# Patient Record
Sex: Male | Born: 1945 | Race: Black or African American | Hispanic: No | Marital: Married | Smoking: Former smoker
Health system: Southern US, Community
[De-identification: ages and names within clinical notes are randomized; demographics above are authoritative.]

## PROBLEM LIST (undated history)

## (undated) DIAGNOSIS — M51369 Other intervertebral disc degeneration, lumbar region without mention of lumbar back pain or lower extremity pain: Secondary | ICD-10-CM

## (undated) DIAGNOSIS — I723 Aneurysm of iliac artery: Secondary | ICD-10-CM

## (undated) DIAGNOSIS — M5136 Other intervertebral disc degeneration, lumbar region: Secondary | ICD-10-CM

## (undated) DIAGNOSIS — K279 Peptic ulcer, site unspecified, unspecified as acute or chronic, without hemorrhage or perforation: Secondary | ICD-10-CM

## (undated) DIAGNOSIS — E079 Disorder of thyroid, unspecified: Secondary | ICD-10-CM

## (undated) DIAGNOSIS — K219 Gastro-esophageal reflux disease without esophagitis: Secondary | ICD-10-CM

## (undated) DIAGNOSIS — E039 Hypothyroidism, unspecified: Secondary | ICD-10-CM

## (undated) DIAGNOSIS — N419 Inflammatory disease of prostate, unspecified: Secondary | ICD-10-CM

## (undated) DIAGNOSIS — I739 Peripheral vascular disease, unspecified: Secondary | ICD-10-CM

## (undated) DIAGNOSIS — E0590 Thyrotoxicosis, unspecified without thyrotoxic crisis or storm: Secondary | ICD-10-CM

## (undated) DIAGNOSIS — K5730 Diverticulosis of large intestine without perforation or abscess without bleeding: Secondary | ICD-10-CM

## (undated) HISTORY — PX: THYROIDECTOMY: SHX17

## (undated) HISTORY — DX: Inflammatory disease of prostate, unspecified: N41.9

## (undated) HISTORY — PX: TONSILLECTOMY: SUR1361

## (undated) HISTORY — PX: NOSE SURGERY: SHX723

## (undated) HISTORY — DX: Peripheral vascular disease, unspecified: I73.9

## (undated) HISTORY — DX: Aneurysm of iliac artery: I72.3

---

## 2003-01-11 ENCOUNTER — Emergency Department (HOSPITAL_COMMUNITY): Admission: EM | Admit: 2003-01-11 | Discharge: 2003-01-11 | Payer: Self-pay | Admitting: Emergency Medicine

## 2003-01-11 ENCOUNTER — Encounter: Payer: Self-pay | Admitting: Internal Medicine

## 2003-06-21 ENCOUNTER — Ambulatory Visit (HOSPITAL_COMMUNITY): Admission: RE | Admit: 2003-06-21 | Discharge: 2003-06-21 | Payer: Self-pay | Admitting: Internal Medicine

## 2003-06-24 HISTORY — PX: COLONOSCOPY: SHX174

## 2003-07-07 ENCOUNTER — Ambulatory Visit (HOSPITAL_COMMUNITY): Admission: RE | Admit: 2003-07-07 | Discharge: 2003-07-07 | Payer: Self-pay | Admitting: General Surgery

## 2003-09-23 HISTORY — PX: ESOPHAGOGASTRODUODENOSCOPY: SHX1529

## 2003-10-12 ENCOUNTER — Inpatient Hospital Stay (HOSPITAL_COMMUNITY): Admission: AD | Admit: 2003-10-12 | Discharge: 2003-10-14 | Payer: Self-pay | Admitting: Family Medicine

## 2004-12-12 ENCOUNTER — Ambulatory Visit: Payer: Self-pay | Admitting: Family Medicine

## 2005-01-23 ENCOUNTER — Ambulatory Visit: Payer: Self-pay | Admitting: Family Medicine

## 2005-01-23 ENCOUNTER — Emergency Department (HOSPITAL_COMMUNITY): Admission: EM | Admit: 2005-01-23 | Discharge: 2005-01-23 | Payer: Self-pay | Admitting: Emergency Medicine

## 2010-04-14 ENCOUNTER — Encounter: Payer: Self-pay | Admitting: Internal Medicine

## 2010-04-15 ENCOUNTER — Encounter: Payer: Self-pay | Admitting: Internal Medicine

## 2012-03-17 ENCOUNTER — Encounter (HOSPITAL_COMMUNITY): Payer: Self-pay | Admitting: *Deleted

## 2012-03-17 ENCOUNTER — Emergency Department (HOSPITAL_COMMUNITY): Payer: Self-pay

## 2012-03-17 ENCOUNTER — Emergency Department (HOSPITAL_COMMUNITY)
Admission: EM | Admit: 2012-03-17 | Discharge: 2012-03-17 | Disposition: A | Discharge status: Home / Self Care | Payer: Self-pay | Attending: Emergency Medicine | Admitting: Emergency Medicine

## 2012-03-17 DIAGNOSIS — R197 Diarrhea, unspecified: Secondary | ICD-10-CM | POA: Insufficient documentation

## 2012-03-17 DIAGNOSIS — Z8719 Personal history of other diseases of the digestive system: Secondary | ICD-10-CM | POA: Insufficient documentation

## 2012-03-17 DIAGNOSIS — Z8639 Personal history of other endocrine, nutritional and metabolic disease: Secondary | ICD-10-CM | POA: Insufficient documentation

## 2012-03-17 DIAGNOSIS — Z862 Personal history of diseases of the blood and blood-forming organs and certain disorders involving the immune mechanism: Secondary | ICD-10-CM | POA: Insufficient documentation

## 2012-03-17 DIAGNOSIS — Z8711 Personal history of peptic ulcer disease: Secondary | ICD-10-CM | POA: Insufficient documentation

## 2012-03-17 DIAGNOSIS — R109 Unspecified abdominal pain: Secondary | ICD-10-CM | POA: Insufficient documentation

## 2012-03-17 DIAGNOSIS — R110 Nausea: Secondary | ICD-10-CM | POA: Insufficient documentation

## 2012-03-17 HISTORY — DX: Disorder of thyroid, unspecified: E07.9

## 2012-03-17 HISTORY — DX: Diverticulosis of large intestine without perforation or abscess without bleeding: K57.30

## 2012-03-17 HISTORY — DX: Peptic ulcer, site unspecified, unspecified as acute or chronic, without hemorrhage or perforation: K27.9

## 2012-03-17 HISTORY — DX: Other intervertebral disc degeneration, lumbar region: M51.36

## 2012-03-17 HISTORY — DX: Other intervertebral disc degeneration, lumbar region without mention of lumbar back pain or lower extremity pain: M51.369

## 2012-03-17 LAB — URINALYSIS, ROUTINE W REFLEX MICROSCOPIC
Bilirubin Urine: NEGATIVE
Glucose, UA: NEGATIVE mg/dL
Ketones, ur: NEGATIVE mg/dL
Nitrite: NEGATIVE
Specific Gravity, Urine: 1.01 (ref 1.005–1.030)
pH: 6 (ref 5.0–8.0)

## 2012-03-17 LAB — CBC WITH DIFFERENTIAL/PLATELET
Basophils Absolute: 0 10*3/uL (ref 0.0–0.1)
Basophils Relative: 0 % (ref 0–1)
Eosinophils Absolute: 0 10*3/uL (ref 0.0–0.7)
Hemoglobin: 15.9 g/dL (ref 13.0–17.0)
MCH: 30.3 pg (ref 26.0–34.0)
MCHC: 34.1 g/dL (ref 30.0–36.0)
Monocytes Relative: 18 % — ABNORMAL HIGH (ref 3–12)
Neutro Abs: 3.2 10*3/uL (ref 1.7–7.7)
Neutrophils Relative %: 56 % (ref 43–77)
Platelets: 229 10*3/uL (ref 150–400)
RDW: 14.9 % (ref 11.5–15.5)

## 2012-03-17 LAB — COMPREHENSIVE METABOLIC PANEL
ALT: 15 U/L (ref 0–53)
AST: 20 U/L (ref 0–37)
Albumin: 4.1 g/dL (ref 3.5–5.2)
Alkaline Phosphatase: 68 U/L (ref 39–117)
BUN: 15 mg/dL (ref 6–23)
Chloride: 97 mEq/L (ref 96–112)
Potassium: 3.9 mEq/L (ref 3.5–5.1)
Sodium: 133 mEq/L — ABNORMAL LOW (ref 135–145)
Total Bilirubin: 0.5 mg/dL (ref 0.3–1.2)
Total Protein: 8.1 g/dL (ref 6.0–8.3)

## 2012-03-17 LAB — LIPASE, BLOOD: Lipase: 44 U/L (ref 11–59)

## 2012-03-17 MED ORDER — IOHEXOL 300 MG/ML  SOLN
100.0000 mL | Freq: Once | INTRAMUSCULAR | Status: AC | PRN
  Administered 2012-03-17: 100 mL via INTRAVENOUS

## 2012-03-17 MED ORDER — FAMOTIDINE IN NACL 20-0.9 MG/50ML-% IV SOLN
20.0000 mg | Freq: Once | INTRAVENOUS | Status: AC
  Administered 2012-03-17: 20 mg via INTRAVENOUS
  Filled 2012-03-17: qty 50

## 2012-03-17 MED ORDER — ONDANSETRON HCL 4 MG PO TABS: 4.0000 mg | ORAL_TABLET | Freq: Three times a day (TID) | ORAL | Status: DC | PRN

## 2012-03-17 MED ORDER — FENTANYL CITRATE 0.05 MG/ML IJ SOLN
50.0000 ug | INTRAMUSCULAR | Status: DC | PRN
  Administered 2012-03-17: 50 ug via INTRAVENOUS
  Filled 2012-03-17: qty 2

## 2012-03-17 MED ORDER — SODIUM CHLORIDE 0.9 % IV SOLN
INTRAVENOUS | Status: DC
  Administered 2012-03-17: 18:00:00 via INTRAVENOUS

## 2012-03-17 MED ORDER — ONDANSETRON HCL 4 MG/2ML IJ SOLN
4.0000 mg | INTRAMUSCULAR | Status: DC | PRN
  Administered 2012-03-17: 4 mg via INTRAVENOUS
  Filled 2012-03-17: qty 2

## 2012-03-17 MED ORDER — PANTOPRAZOLE SODIUM 40 MG IV SOLR
40.0000 mg | Freq: Once | INTRAVENOUS | Status: AC
  Administered 2012-03-17: 40 mg via INTRAVENOUS
  Filled 2012-03-17: qty 40

## 2012-03-17 NOTE — ED Notes (Signed)
Family at bedside. Patient and family state they do not need anything at this time.

## 2012-03-17 NOTE — ED Notes (Signed)
Pt reporting improvement in pain level.  Denies nausea at this time.  Per MD, occult blood test was negative.  Pt and family denies needs at present time.  No distress noted.

## 2012-03-17 NOTE — ED Notes (Addendum)
abd pain, nausea, no vomiting, cough, chills,  Diarrhea,  Denies urinary sx.

## 2012-03-17 NOTE — ED Notes (Signed)
Pt c/o left side abd pain that started on Sunday, admits to nausea and chills, denies any vomiting, diarrhea, pt does state that he has had productive cough with green sputum over the past few days,

## 2012-03-17 NOTE — ED Provider Notes (Signed)
History     CSN: [id number]  Arrival date & time 03/17/12  1652   First MD Initiated Contact with Patient 03/17/12 1704      Chief Complaint  Patient presents with  . Abdominal Pain     HPI Pt was seen at 1705.   Per pt, c/o gradual onset and persistence of constant abd "pain" for the past 2 days.  States the pain began in the left side of his abd and now is "all over." Has been associated with nausea and multiple intermittent episodes of diarrhea.  Describes the diarrhea as "watery." Denies vomiting, no fevers, no back pain, no rash, no CP/SOB, no black or blood in stools, no testicular pain/swelling, no dysuria/hematuria.       Past Medical History  Diagnosis Date  . Peptic ulcer   . Thyroid disease   . Diverticula of colon     Past Surgical History  Procedure Date  . Thyroidectomy   . Tonsillectomy     History  Substance Use Topics  . Smoking status: Never Smoker   . Smokeless tobacco: Not on file  . Alcohol Use: No     Comment: former    Review of Systems ROS: Statement: All systems negative except as marked or noted in the HPI; Constitutional: Negative for fever and chills. ; ; Eyes: Negative for eye pain, redness and discharge. ; ; ENMT: Negative for ear pain, hoarseness, nasal congestion, sinus pressure and sore throat. ; ; Cardiovascular: Negative for chest pain, palpitations, diaphoresis, dyspnea and peripheral edema. ; ; Respiratory: Negative for cough, wheezing and stridor. ; ; Gastrointestinal: +nausea, diarrhea, abd pain. Negative for vomiting, blood in stool, hematemesis, jaundice and rectal bleeding. . ; ; Genitourinary: Negative for dysuria, flank pain and hematuria. ; ; Genital:  No penile drainage or rash, no testicular pain or swelling, no scrotal rash or swelling.;; Musculoskeletal: Negative for back pain and neck pain. Negative for swelling and trauma.; ; Skin: Negative for pruritus, rash, abrasions, blisters, bruising and skin lesion.; ; Neuro: Negative  for headache, lightheadedness and neck stiffness. Negative for weakness, altered level of consciousness , altered mental status, extremity weakness, paresthesias, involuntary movement, seizure and syncope.      Allergies  Review of patient's allergies indicates no known allergies.  Home Medications  No current outpatient prescriptions on file.  BP 121/82  Pulse 99  Temp 98 F (36.7 C) (Oral)  Resp 18  Ht 6\' 2"  (1.88 m)  Wt 172 lb (78.019 kg)  BMI 22.08 kg/m2  SpO2 97%  Physical Exam 1710: Physical examination:  Nursing notes reviewed; Vital signs and O2 SAT reviewed;  Constitutional: Well developed, Well nourished, Well hydrated, Uncomfortable appearing; Head:  Normocephalic, atraumatic; Eyes: EOMI, PERRL, No scleral icterus; ENMT: Mouth and pharynx normal, Mucous membranes moist; Neck: Supple, Full range of motion, No lymphadenopathy; Cardiovascular: Regular rate and rhythm, No murmur, rub, or gallop; Respiratory: Breath sounds clear & equal bilaterally, No rales, rhonchi, wheezes.  Speaking full sentences with ease, Normal respiratory effort/excursion; Chest: Nontender, Movement normal; Abdomen: Soft, +LUQ>LLQ tenderness to palp. No rebound or guarding. Nondistended, Normal bowel sounds. Rectal exam performed w/permission of pt and ED RN chaparone present.  Anal tone normal.  Non-tender, soft brown stool in rectal vault, heme neg.  No fissures, no external hemorrhoids, no palp masses.;; Genitourinary: No CVA tenderness; Extremities: Pulses normal, No tenderness, No edema, No calf edema or asymmetry.; Neuro: AA&Ox3, Major CN grossly intact.  Speech clear. No gross focal motor or  sensory deficits in extremities.; Skin: Color normal, Warm, Dry.   ED Course  Procedures    MDM  MDM Reviewed: nursing note, previous chart and vitals Interpretation: labs, x-ray and CT scan   Results for orders placed during the hospital encounter of 03/17/12  CBC WITH DIFFERENTIAL      Component Value  Range   WBC 5.7  4.0 - 10.5 K/uL   RBC 5.24  4.22 - 5.81 MIL/uL   Hemoglobin 15.9  13.0 - 17.0 g/dL   HCT 47.8  29.5 - 62.1 %   MCV 88.9  78.0 - 100.0 fL   MCH 30.3  26.0 - 34.0 pg   MCHC 34.1  30.0 - 36.0 g/dL   RDW 30.8  65.7 - 84.6 %   Platelets 229  150 - 400 K/uL   Neutrophils Relative 56  43 - 77 %   Neutro Abs 3.2  1.7 - 7.7 K/uL   Lymphocytes Relative 25  12 - 46 %   Lymphs Abs 1.4  0.7 - 4.0 K/uL   Monocytes Relative 18 (*) 3 - 12 %   Monocytes Absolute 1.1 (*) 0.1 - 1.0 K/uL   Eosinophils Relative 0  0 - 5 %   Eosinophils Absolute 0.0  0.0 - 0.7 K/uL   Basophils Relative 0  0 - 1 %   Basophils Absolute 0.0  0.0 - 0.1 K/uL  COMPREHENSIVE METABOLIC PANEL      Component Value Range   Sodium 133 (*) 135 - 145 mEq/L   Potassium 3.9  3.5 - 5.1 mEq/L   Chloride 97  96 - 112 mEq/L   CO2 26  19 - 32 mEq/L   Glucose, Bld 100 (*) 70 - 99 mg/dL   BUN 15  6 - 23 mg/dL   Creatinine, Ser 9.62  0.50 - 1.35 mg/dL   Calcium 95.2  8.4 - 84.1 mg/dL   Total Protein 8.1  6.0 - 8.3 g/dL   Albumin 4.1  3.5 - 5.2 g/dL   AST 20  0 - 37 U/L   ALT 15  0 - 53 U/L   Alkaline Phosphatase 68  39 - 117 U/L   Total Bilirubin 0.5  0.3 - 1.2 mg/dL   GFR calc non Af Amer 69 (*) >90 mL/min   GFR calc Af Amer 80 (*) >90 mL/min  LIPASE, BLOOD      Component Value Range   Lipase 44  11 - 59 U/L  URINALYSIS, ROUTINE W REFLEX MICROSCOPIC      Component Value Range   Color, Urine YELLOW  YELLOW   APPearance CLEAR  CLEAR   Specific Gravity, Urine 1.010  1.005 - 1.030   pH 6.0  5.0 - 8.0   Glucose, UA NEGATIVE  NEGATIVE mg/dL   Hgb urine dipstick NEGATIVE  NEGATIVE   Bilirubin Urine NEGATIVE  NEGATIVE   Ketones, ur NEGATIVE  NEGATIVE mg/dL   Protein, ur TRACE (*) NEGATIVE mg/dL   Urobilinogen, UA 0.2  0.0 - 1.0 mg/dL   Nitrite NEGATIVE  NEGATIVE   Leukocytes, UA NEGATIVE  NEGATIVE  LACTIC ACID, PLASMA      Component Value Range   Lactic Acid, Venous 1.5  0.5 - 2.2 mmol/L  URINE MICROSCOPIC-ADD  ON      Component Value Range   Squamous Epithelial / LPF RARE  RARE   Dg Chest 2 View 03/17/2012  *RADIOLOGY REPORT*  Clinical Data: Abdominal pain.  Nausea.  Infiltrate.  CHEST - 2 VIEW  Comparison: 10/12/2003.  Findings:    Cardiopericardial silhouette within normal limits. Mediastinal contours normal. Trachea midline.  No airspace disease or effusion.  On the lateral view, there is a 14 mm nodular density projected over the upper thoracic spine.  This may represent pulmonary nodule or small bony lesion.  This was not visualized on the comparison radiograph.  IMPRESSION: No active cardiopulmonary disease.  Nodular density projected over the upper thoracic spine on the lateral view.  This is not seen on the frontal view.  Pulmonary nodule is not excluded.  Follow-up noncontrast chest CT is recommended which can be performed on a non emergent basis.   Original Report Authenticated By: Andreas Newport, M.D.    Ct Abdomen Pelvis W Contrast 03/17/2012  *RADIOLOGY REPORT*  Clinical Data: Abdominal pain, diarrhea  CT ABDOMEN AND PELVIS WITH CONTRAST  Technique:  Multidetector CT imaging of the abdomen and pelvis was performed following the standard protocol during bolus administration of intravenous contrast.  Contrast: OMNIPAQUE IOHEXOL 300 MG/ML  SOLN  Comparison: CT abdomen of 01/23/2005  Findings:  The lung bases are clear.  The liver enhances with only a single small sub centimeter low attenuation structure in the right lobe which appears stable and most consistent with a benign process such as a cyst.  No ductal dilatation is seen.  No calcified gallstones are noted.  The pancreas is normal in size and the pancreatic duct is not dilated.  The adrenal glands and spleen are unremarkable although there may be a small left adrenal adenoma present which appears stable.  The stomach is not well distended. The kidneys enhance with no calculus or mass and on delayed images, the pelvocaliceal systems are  unremarkable.  The abdominal aorta is normal in caliber.  No adenopathy is seen.  Atheromatous change is noted in the distal abdominal aorta extending into the common iliac arteries.  The urinary bladder is unremarkable.  The prostate is slightly prominent.  There is some fluid noted within the colon which may be due to the recent diarrhea.  No colonic mass or edema is seen.  The terminal ileum and the appendix are unremarkable.  Degenerative disc disease is noted diffusely throughout the lumbar spine.  IMPRESSION: 1.  There is some fluid in the colon most likely due to diarrhea. However, no colonic mass or edema is seen.  2.  No explanation for the patient's pain is seen.  3.  Diffuse degenerative change throughout the lumbar spine.   Original Report Authenticated By: Dwyane Dee, M.D.       1950:   Pt has tol PO well while in the ED without N/V.  No stooling while in the ED.  VSS. Pt states he feels better and wants to go home now. Wife now at bedside, states he "hasn't been taking his stomach medicines" for several days before his symptoms began. Pt encouraged to take his usual meds daily as prescribed. Dx and testing d/w pt and family.  Questions answered.  Verb understanding, agreeable to d/c home with outpt f/u.          Laray Anger, DO 03/20/12 0025

## 2012-03-17 NOTE — ED Notes (Signed)
Performed pericare and put depends on. Patient asked does he live at home and patient states he does.

## 2012-03-19 LAB — URINE CULTURE: Colony Count: 15000

## 2012-03-19 LAB — OCCULT BLOOD, POC DEVICE: Fecal Occult Bld: NEGATIVE

## 2012-10-16 ENCOUNTER — Emergency Department (HOSPITAL_COMMUNITY): Payer: Medicare Other

## 2012-10-16 ENCOUNTER — Encounter (HOSPITAL_COMMUNITY): Payer: Self-pay

## 2012-10-16 ENCOUNTER — Emergency Department (HOSPITAL_COMMUNITY)
Admission: EM | Admit: 2012-10-16 | Discharge: 2012-10-16 | Disposition: A | Discharge status: Home / Self Care | Payer: Medicare Other | Attending: Emergency Medicine | Admitting: Emergency Medicine

## 2012-10-16 DIAGNOSIS — R109 Unspecified abdominal pain: Secondary | ICD-10-CM | POA: Insufficient documentation

## 2012-10-16 DIAGNOSIS — Z8719 Personal history of other diseases of the digestive system: Secondary | ICD-10-CM | POA: Insufficient documentation

## 2012-10-16 DIAGNOSIS — N390 Urinary tract infection, site not specified: Secondary | ICD-10-CM | POA: Insufficient documentation

## 2012-10-16 DIAGNOSIS — Z8739 Personal history of other diseases of the musculoskeletal system and connective tissue: Secondary | ICD-10-CM | POA: Insufficient documentation

## 2012-10-16 DIAGNOSIS — E079 Disorder of thyroid, unspecified: Secondary | ICD-10-CM | POA: Insufficient documentation

## 2012-10-16 DIAGNOSIS — R6883 Chills (without fever): Secondary | ICD-10-CM | POA: Insufficient documentation

## 2012-10-16 DIAGNOSIS — Z79899 Other long term (current) drug therapy: Secondary | ICD-10-CM | POA: Insufficient documentation

## 2012-10-16 DIAGNOSIS — R32 Unspecified urinary incontinence: Secondary | ICD-10-CM | POA: Insufficient documentation

## 2012-10-16 LAB — CBC WITH DIFFERENTIAL/PLATELET
Eosinophils Relative: 0 % (ref 0–5)
HCT: 43.8 % (ref 39.0–52.0)
Hemoglobin: 15.1 g/dL (ref 13.0–17.0)
Lymphocytes Relative: 11 % — ABNORMAL LOW (ref 12–46)
MCHC: 34.5 g/dL (ref 30.0–36.0)
MCV: 89.9 fL (ref 78.0–100.0)
Monocytes Absolute: 1.8 10*3/uL — ABNORMAL HIGH (ref 0.1–1.0)
Monocytes Relative: 10 % (ref 3–12)
Neutro Abs: 14.7 10*3/uL — ABNORMAL HIGH (ref 1.7–7.7)
WBC: 18.6 10*3/uL — ABNORMAL HIGH (ref 4.0–10.5)

## 2012-10-16 LAB — COMPREHENSIVE METABOLIC PANEL
Alkaline Phosphatase: 78 U/L (ref 39–117)
BUN: 14 mg/dL (ref 6–23)
CO2: 29 mEq/L (ref 19–32)
Chloride: 98 mEq/L (ref 96–112)
Creatinine, Ser: 0.83 mg/dL (ref 0.50–1.35)
Glucose, Bld: 125 mg/dL — ABNORMAL HIGH (ref 70–99)

## 2012-10-16 LAB — URINALYSIS, ROUTINE W REFLEX MICROSCOPIC
Specific Gravity, Urine: >1.03 — ABNORMAL HIGH (ref 1.005–1.030)
pH: 5.5 (ref 5.0–8.0)

## 2012-10-16 LAB — URINE MICROSCOPIC-ADD ON

## 2012-10-16 MED ORDER — IOHEXOL 300 MG/ML  SOLN
50.0000 mL | Freq: Once | INTRAMUSCULAR | Status: AC | PRN
  Administered 2012-10-16: 50 mL via ORAL

## 2012-10-16 MED ORDER — HYDROCODONE-ACETAMINOPHEN 5-325 MG PO TABS: 1.0000 | ORAL_TABLET | Freq: Four times a day (QID) | ORAL | Status: DC | PRN

## 2012-10-16 MED ORDER — HYDROMORPHONE HCL PF 1 MG/ML IJ SOLN
1.0000 mg | Freq: Once | INTRAMUSCULAR | Status: AC
  Administered 2012-10-16: 1 mg via INTRAVENOUS
  Filled 2012-10-16: qty 1

## 2012-10-16 MED ORDER — IOHEXOL 300 MG/ML  SOLN
100.0000 mL | Freq: Once | INTRAMUSCULAR | Status: AC | PRN
  Administered 2012-10-16: 100 mL via INTRAVENOUS

## 2012-10-16 MED ORDER — ONDANSETRON HCL 4 MG/2ML IJ SOLN
4.0000 mg | Freq: Once | INTRAMUSCULAR | Status: AC
  Administered 2012-10-16: 4 mg via INTRAVENOUS
  Filled 2012-10-16: qty 2

## 2012-10-16 MED ORDER — CEPHALEXIN 500 MG PO CAPS: 500.0000 mg | ORAL_CAPSULE | Freq: Four times a day (QID) | ORAL | Status: DC

## 2012-10-16 MED ORDER — CIPROFLOXACIN HCL 500 MG PO TABS: 500.0000 mg | ORAL_TABLET | Freq: Two times a day (BID) | ORAL | Status: DC

## 2012-10-16 MED ORDER — DEXTROSE 5 % IV SOLN: 1.0000 g | Freq: Once | INTRAVENOUS | Status: DC

## 2012-10-16 MED ORDER — DEXTROSE 5 % IV SOLN
1.0000 g | Freq: Once | INTRAVENOUS | Status: AC
  Administered 2012-10-16: 1 g via INTRAVENOUS
  Filled 2012-10-16: qty 10

## 2012-10-16 NOTE — ED Notes (Signed)
Pt states he is unable to hold his urine since yesterday. States he has had this problem before. Also, complain of back and belly pain

## 2012-10-16 NOTE — Discharge Instructions (Signed)
Drink plenty of fluids.  Follow up with your md next week °

## 2012-10-16 NOTE — ED Provider Notes (Signed)
CSN: [id number]     Arrival date & time 10/16/12  7829 History  This chart was scribed for Benny Lennert, MD by Bennett Scrape, ED Scribe. This patient was seen in room APA16A/[facility]16A and the patient's care was started at 9:47 AM.   First MD Initiated Contact with Patient 10/16/12 [phone]     Chief Complaint  Patient presents with  . Urinary Frequency    Patient is a 67 y.o. male presenting with general illness. The history is provided by the patient. No language interpreter was used.  Illness Location:  Urinary incontience  Quality:  Unable to hold urine Duration:  2 days Timing:  Constant Progression:  Unchanged Chronicity:  New Relieved by:  None Worsened by:  None Associated symptoms: abdominal pain   Associated symptoms: no congestion, no cough, no diarrhea, no fatigue, no nausea and no rash     HPI Comments: Sean Leonard is a 67 y.o. male who presents to the Emergency Department complaining of 2 days of urinary incontinence described as a complete inability to hold his urine with associated chills and abdominal pain. The abdominal pain is located in the LLQ and radiates to his lower back. He describes the pain as a soreness. He admits that he has experienced one prior episode of the same symptoms but does not elaborate further. He reports that he called his PCP and had an appointment today for the symptoms but could not wait any longer. He denies fevers and nausea as associated symptoms.    Past Medical History  Diagnosis Date  . Peptic ulcer   . Thyroid disease   . Diverticula of colon   . DDD (degenerative disc disease), lumbar    Past Surgical History  Procedure Laterality Date  . Thyroidectomy    . Tonsillectomy     No family history on file. History  Substance Use Topics  . Smoking status: Never Smoker   . Smokeless tobacco: Not on file  . Alcohol Use: No     Comment: former    Review of Systems  Constitutional: Positive for chills. Negative for  appetite change and fatigue.  HENT: Negative for congestion, sinus pressure and ear discharge.   Eyes: Negative for discharge.  Respiratory: Negative for cough.   Gastrointestinal: Positive for abdominal pain. Negative for nausea and diarrhea.  Genitourinary: Negative for hematuria.       Positive for urinary incontinence   Musculoskeletal: Negative for back pain.  Skin: Negative for rash.  Neurological: Negative for seizures.  Psychiatric/Behavioral: Negative for hallucinations.    Allergies  Review of patient's allergies indicates no known allergies.  Home Medications   Current Outpatient Rx  Name  Route  Sig  Dispense  Refill  . levothyroxine (SYNTHROID, LEVOTHROID) 112 MCG tablet   Oral   Take 112 mcg by mouth every morning.         Marland Kitchen omeprazole (PRILOSEC) 20 MG capsule   Oral   Take 20 mg by mouth daily.         . ondansetron (ZOFRAN) 4 MG tablet   Oral   Take 1 tablet (4 mg total) by mouth every 8 (eight) hours as needed for nausea.   6 tablet   0   . pravastatin (PRAVACHOL) 20 MG tablet   Oral   Take 20 mg by mouth at bedtime.         . sucralfate (CARAFATE) 1 G tablet   Oral   Take 1 g by mouth 2 (two)  times daily.          Triage Vitals: BP 131/78  Pulse 114  Temp(Src) 98.5 F (36.9 C) (Oral)  Resp 26  Ht 6\' 2"  (1.88 m)  Wt 176 lb (79.833 kg)  BMI 22.59 kg/m2  SpO2 100%  Physical Exam  Nursing note and vitals reviewed. Constitutional: He is oriented to person, place, and time. He appears well-developed.  HENT:  Head: Normocephalic.  Eyes: Conjunctivae and EOM are normal. No scleral icterus.  Neck: Neck supple. No thyromegaly present.  Cardiovascular: Normal rate and regular rhythm.  Exam reveals no gallop and no friction rub.   No murmur heard. Pulmonary/Chest: No stridor. He has no wheezes. He has no rales. He exhibits no tenderness.  Abdominal: He exhibits no distension. There is tenderness (LUQ, LLQ and epigastric region tenderness).  There is no rebound.  Musculoskeletal: Normal range of motion. He exhibits no edema.  Lymphadenopathy:    He has no cervical adenopathy.  Neurological: He is oriented to person, place, and time. Coordination normal.  Skin: No rash noted. No erythema.  Psychiatric: He has a normal mood and affect. His behavior is normal.    ED Course   Procedures (including critical care time)  Medications  cefTRIAXone (ROCEPHIN) 1 g in dextrose 5 % 50 mL IVPB (not administered)  HYDROmorphone (DILAUDID) injection 1 mg (1 mg Intravenous Given 10/16/12 1009)  ondansetron (ZOFRAN) injection 4 mg (4 mg Intravenous Given 10/16/12 1009)  iohexol (OMNIPAQUE) 300 MG/ML solution 50 mL (50 mLs Oral Contrast Given 10/16/12 1014)  iohexol (OMNIPAQUE) 300 MG/ML solution 100 mL (100 mLs Intravenous Contrast Given 10/16/12 1121)    DIAGNOSTIC STUDIES: Oxygen Saturation is 199% on room air, normal by my interpretation.    COORDINATION OF CARE: 9:50 AM-Discussed treatment plan which includes medications, CT of abdomen, CBC panel, CMP, and UA with pt at bedside and pt agreed to plan.  12:26 PM-Pt rechecked and is resting comfortably. Informed pt of radiology and lab work results showing a bladder infection. Discussed discharge plan which includes antibiotics with pt and pt agreed to plan. Also advised pt to follow up with PCP next week as needed and pt agreed. Addressed symptoms to return for with pt.   Labs Reviewed  CBC WITH DIFFERENTIAL - Abnormal; Notable for the following:    WBC 18.6 (*)    Neutrophils Relative % 79 (*)    Neutro Abs 14.7 (*)    Lymphocytes Relative 11 (*)    Monocytes Absolute 1.8 (*)    All other components within normal limits  COMPREHENSIVE METABOLIC PANEL - Abnormal; Notable for the following:    Glucose, Bld 125 (*)    All other components within normal limits  URINALYSIS, ROUTINE W REFLEX MICROSCOPIC - Abnormal; Notable for the following:    Color, Urine AMBER (*)    Specific Gravity,  Urine >1.030 (*)    Hgb urine dipstick MODERATE (*)    Bilirubin Urine SMALL (*)    Ketones, ur 15 (*)    Protein, ur 100 (*)    All other components within normal limits  URINE MICROSCOPIC-ADD ON - Abnormal; Notable for the following:    Bacteria, UA MANY (*)    All other components within normal limits  LIPASE, BLOOD   Ct Abdomen Pelvis W Contrast  10/16/2012   *RADIOLOGY REPORT*  Clinical Data: Urinary incontinence 2 days.  Chills and left lower quadrant abdominal pelvic pain.  History of peptic ulcer.  Thyroid disease.  Diverticulitis.  CT ABDOMEN AND PELVIS WITH CONTRAST  Technique:  Multidetector CT imaging of the abdomen and pelvis was performed following the standard protocol during bolus administration of intravenous contrast.  Contrast: 50mL OMNIPAQUE IOHEXOL 300 MG/ML  SOLN, OMNIPAQUE IOHEXOL 300 MG/ML  SOLN  Comparison: 03/17/2012  Findings: Lung bases:  Clear lung bases.  Normal heart size without pericardial or pleural effusion.  Small hiatal hernia.  Abdomen/pelvis:  Tiny right liver lobe lesion is unchanged and likely a cyst.  Splenule.  Underdistended gastric antrum.  Normal pancreas, gallbladder, biliary tract, right adrenal gland.  Mild left adrenal thickening which is unchanged.  Too small to characterize lesions within the kidneys bilaterally. Advanced aortic atherosclerosis. No retroperitoneal or retrocrural adenopathy.  Scattered colonic diverticula.  Appendix is not visualized but there is no evidence of right lower quadrant inflammation.  Normal terminal ileum.  Normal small bowel without abdominal ascites.  Atherosclerosis with stenosis versus occlusion of the right internal iliac artery on image 48/series 2.  It is opacified distally, but mildly aneurysmal 1.4 cm on image 50/series 2. No pelvic adenopathy.  The left internal iliac artery is also aneurysmal and maximally 1.4 cm on image 52/series 2.  Bladder is collapsed around a Foley catheter.  There is moderate  prostatomegaly.  Ill-defined soft tissue plane between the anterior prostate and posterior aspect of the urinary bladder, nonspecific in the setting of Foley catheter. No significant free fluid.  Bones/Musculoskeletal:  No acute osseous abnormality.  Lower lumbar spondylosis and degenerative disc disease.  IMPRESSION:  1. No acute process in the abdomen or pelvis. 2.  Urinary bladder collapsed around a Foley catheter.  Concurrent prostatomegaly and ill-defined periprostatic fat planes. Nonspecific.  Consider correlation with PSA level and physical exam findings to suggest prostatitis. 3.  Bilateral internal iliac artery aneurysms.  Significant stenosis versus near occlusion of the proximal right internal iliac artery.   Original Report Authenticated By: Jeronimo Greaves, M.D.    No diagnosis found.  MDM    The chart was scribed for me under my direct supervision.  I personally performed the history, physical, and medical decision making and all procedures in the evaluation of this patient.Benny Lennert, MD 10/16/12 [phone]

## 2012-11-17 ENCOUNTER — Telehealth: Payer: Self-pay

## 2012-11-17 NOTE — Telephone Encounter (Signed)
Pt is scheduled for OV with Tana Coast PA on 11/19/2012 at 11:30, and he is aware.

## 2012-11-17 NOTE — Telephone Encounter (Signed)
Pt was referred by Dr. Mirna Mires for an urgent appt for weight loss. I called pt and he said he has lost about 25 pounds since last October.  He had gained a few pounds when he saw Dr. Loleta Chance on 10/28/2012 but states he has lost 7 lbs since that office visit.  He had some vomiting on Sat night 11/14/2012 x 3 times but has not had any since then. He does have some abdominal discomfort on his left side at times.  Please advise about urgent appt.

## 2012-11-17 NOTE — Telephone Encounter (Signed)
Within the next 7 days.

## 2012-11-19 ENCOUNTER — Ambulatory Visit (INDEPENDENT_AMBULATORY_CARE_PROVIDER_SITE_OTHER): Payer: Medicare Other | Admitting: Gastroenterology

## 2012-11-19 ENCOUNTER — Encounter: Payer: Self-pay | Admitting: Gastroenterology

## 2012-11-19 VITALS — BP 109/65 | HR 81 | Temp 98.3°F | Ht 74.0 in | Wt 165.8 lb

## 2012-11-19 DIAGNOSIS — R1013 Epigastric pain: Secondary | ICD-10-CM

## 2012-11-19 DIAGNOSIS — R972 Elevated prostate specific antigen [PSA]: Secondary | ICD-10-CM

## 2012-11-19 DIAGNOSIS — Z8711 Personal history of peptic ulcer disease: Secondary | ICD-10-CM

## 2012-11-19 DIAGNOSIS — I723 Aneurysm of iliac artery: Secondary | ICD-10-CM

## 2012-11-19 DIAGNOSIS — I771 Stricture of artery: Secondary | ICD-10-CM

## 2012-11-19 DIAGNOSIS — R634 Abnormal weight loss: Secondary | ICD-10-CM

## 2012-11-19 DIAGNOSIS — R1012 Left upper quadrant pain: Secondary | ICD-10-CM

## 2012-11-19 MED ORDER — OMEPRAZOLE 20 MG PO CPDR: 20.0000 mg | DELAYED_RELEASE_CAPSULE | Freq: Every day | ORAL | Status: DC

## 2012-11-19 NOTE — Assessment & Plan Note (Signed)
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 Patient is symptomatic. Referral to be made.

## 2012-11-19 NOTE — Progress Notes (Signed)
Referral has been sent to [facility] and they will contact me & Mr. Cryder with appointment date & time

## 2012-11-19 NOTE — Patient Instructions (Addendum)
1. I have requested records of prior upper endoscopy and colonoscopy done by Dr. Katrinka Blazing so that we can determine what further workup is necessary. You will need to have an upper endoscopy, but we will need to determine whether or not you should have a colonoscopy as well. Once records are reviewed, we will get you scheduled for procedures as appropriate. 2. 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 The one on the right appears blocked as well. This may be contributing to your right leg pain. We will make a referral to discuss this with a specialist. 3. Keep your appointment with the urologist next week regarding your prostate. 4. You may stop sulcrafate. 5. I recommend you take omeprazole 20mg  daily until we sort out the cause of your abdominal pain. Prescription sent to your pharmacy.

## 2012-11-19 NOTE — Assessment & Plan Note (Signed)
67 year old gentleman with reported 20 pound weight loss for the past one-year he has a history of episodic left upper quadrant/epigastric pain for the past several months. He has had 2 episodes in the past 2 months. Went to the emergency department last month with this. Nothing on CT to explain left upper quadrant pain. Gives history of peptic ulcer disease remotely. Has been on Carafate according to his report for nearly 10 years. He will likely need upper endoscopy. I have requested his prior EGD and colonoscopy reports. He may be due for colonoscopy as well this time. Otherwise denies bowel issues.  Stop Carafate. Begin omeprazole 20 mg daily.

## 2012-11-19 NOTE — Progress Notes (Signed)
Please make arrangements for vascular referral for right internal iliac artery stenosis.

## 2012-11-19 NOTE — Progress Notes (Signed)
Primary Care Physician:  Evlyn Courier, MD  Primary Gastroenterologist:  Roetta Sessions, MD   Chief Complaint  Patient presents with  . Weight Loss    HPI:  Sean Leonard is a 67 y.o. male here at the request of Dr. Loleta Chance for further evaluation of weight loss. Patient states he's lost over 20 pounds since October of last year. Initially he was having trouble getting his Synthroid dose right but has been stable over the past 6 months. At baseline he weighed 185 pounds. Gives a history of peptic ulcer disease back in 2005, diagnosed by Dr. Elpidio Anis. States he's been on Carafate since that time. Had a colonoscopy around that time which he reported to be normal. We have requested records. Patient has episodic pain in the left upper quadrant/epigastric region. He's had 2 episodes in the last 2 months. Intermittent vomiting. No diarrhea, constipation, melena, rectal bleeding, dysphagia, heartburn.  CT scan in July showed bilateral internal iliac artery aneurysms, significant stenosis versus near occlusion of the proximal right internal iliac artery. He also had prostatomegaly and ill-defined. Prostatic fat planes. Patient states he was treated for prostatitis. PSA 16.9. He has an appointment with the urologist at [facility] next week. Chest x-ray in December 2013 showed nodular density projected over the upper thoracic spine on the lateral view. Pulmonary nodule not excluded. Noncontrast chest CT recommended. This has not been done.  Patient also complains of right leg pain with ambulation.  Current Outpatient Prescriptions  Medication Sig Dispense Refill  . fish oil-omega-3 fatty acids 1000 MG capsule Take 2 g by mouth daily.      Marland Kitchen levothyroxine (SYNTHROID, LEVOTHROID) 112 MCG tablet Take 112 mcg by mouth every morning.      . Multiple Vitamin (MULTIVITAMIN) capsule Take 1 capsule by mouth daily.      . pravastatin (PRAVACHOL) 20 MG tablet Take 20 mg by mouth at bedtime.      . sucralfate  (CARAFATE) 1 G tablet Take 1 g by mouth 2 (two) times daily.       No current facility-administered medications for this visit.    Allergies as of 11/19/2012  . (No Known Allergies)    Past Medical History  Diagnosis Date  . Peptic ulcer   . Thyroid disease   . Diverticula of colon   . DDD (degenerative disc disease), lumbar   . Prostatitis   . Iliac artery aneurysm, bilateral     with significant stenosis of right internal iliac artery    Past Surgical History  Procedure Laterality Date  . Thyroidectomy    . Tonsillectomy      Family History  Problem Relation Age of Onset  . Colon cancer Neg Hx   . Liver disease Neg Hx     History   Social History  . Marital Status: Married    Spouse Name: N/A    Number of Children: N/A  . Years of Education: N/A   Occupational History  . Not on file.   Social History Main Topics  . Smoking status: Former Games developer  . Smokeless tobacco: Not on file     Comment: remote  . Alcohol Use: No     Comment: former. weekend drinker (liqour) before, last etoh 03/2012.   . Drug Use: No  . Sexual Activity: Not on file   Other Topics Concern  . Not on file   Social History Narrative   1 deceased child. 1 living      ROS:  General: Negative for anorexia,fever, chills, fatigue, weakness. See history of present illness for weight loss. Eyes: Negative for vision changes.  ENT: Negative for hoarseness, difficulty swallowing , nasal congestion. CV: Negative for chest pain, angina, palpitations, dyspnea on exertion, peripheral edema. Right leg pain with exertion. Respiratory: Negative for dyspnea at rest, dyspnea on exertion, cough, sputum, wheezing.  GI: See history of present illness. GU:  Negative for dysuria, hematuria, urinary incontinence, urinary frequency, nocturnal urination.  MS: Negative for joint pain, low back pain.  Derm: Negative for rash or itching.  Neuro: Negative for weakness, abnormal sensation, seizure, frequent  headaches, memory loss, confusion.  Psych: Negative for anxiety, depression, suicidal ideation, hallucinations.  Endo: Negative for unusual weight change. See history of present illness Heme: Negative for bruising or bleeding. Allergy: Negative for rash or hives.    Physical Examination:  BP 109/65  Pulse 81  Temp(Src) 98.3 F (36.8 C) (Oral)  Ht 6\' 2"  (1.88 m)  Wt 165 lb 12.8 oz (75.206 kg)  BMI 21.28 kg/m2   General: Well-nourished, well-developed in no acute distress.  Head: Normocephalic, atraumatic.   Eyes: Conjunctiva pink, no icterus. Mouth: Oropharyngeal mucosa moist and pink , no lesions erythema or exudate. Neck: Supple without thyromegaly, masses, or lymphadenopathy.  Lungs: Clear to auscultation bilaterally.  Heart: Regular rate and rhythm, no murmurs rubs or gallops.  Abdomen: Bowel sounds are normal, left upper quadrant/epigastric tenderness. No rib cage pain. nontender, nondistended, no hepatosplenomegaly or masses, no abdominal bruits or    hernia , no rebound or guarding.   Rectal: Not performed. Extremities: No lower extremity edema. No clubbing or deformities.  Neuro: Alert and oriented x 4 , grossly normal neurologically.  Skin: Warm and dry, no rash or jaundice.   Psych: Alert and cooperative, normal mood and affect.  Labs: Lab Results  Component Value Date   WBC 18.6* 10/16/2012   HGB 15.1 10/16/2012   HCT 43.8 10/16/2012   MCV 89.9 10/16/2012   PLT 210 10/16/2012   Lab Results  Component Value Date   CREATININE 0.83 10/16/2012   BUN 14 10/16/2012   NA 137 10/16/2012   K 3.6 10/16/2012   CL 98 10/16/2012   CO2 29 10/16/2012   Lab Results  Component Value Date   ALT 12 10/16/2012   AST 12 10/16/2012   ALKPHOS 78 10/16/2012   BILITOT 1.2 10/16/2012   Lab Results  Component Value Date   LIPASE 25 10/16/2012    Labs on 10/20/2012 white blood cell count 8000, hemoglobin 13.6, hematocrit 40.8, MCV 89.7, platelets 303,000. CEA 1.5, PSA 16.9 Labs from  February 2014. Total bilirubin 0.4, alkaline phosphatase 61, AST 15, ALT 14, albumin 4.2. Imaging Studies: CT A/P with contrast 10/16/12  IMPRESSION:  1. No acute process in the abdomen or pelvis.  2. Urinary bladder collapsed around a Foley catheter. Concurrent  prostatomegaly and ill-defined periprostatic fat planes.  Nonspecific. Consider correlation with PSA level and physical exam  findings to suggest prostatitis.  3. Bilateral internal iliac artery aneurysms. Significant  stenosis versus near occlusion of the proximal right internal iliac  artery.

## 2012-11-20 NOTE — Progress Notes (Signed)
CC'd to PCP 

## 2012-11-25 ENCOUNTER — Encounter: Payer: Self-pay | Admitting: Vascular Surgery

## 2012-11-26 ENCOUNTER — Ambulatory Visit (INDEPENDENT_AMBULATORY_CARE_PROVIDER_SITE_OTHER): Payer: Medicare Other | Admitting: Vascular Surgery

## 2012-11-26 ENCOUNTER — Encounter: Payer: Self-pay | Admitting: Vascular Surgery

## 2012-11-26 VITALS — BP 146/92 | HR 73 | Resp 18 | Ht 74.0 in | Wt 166.0 lb

## 2012-11-26 DIAGNOSIS — I723 Aneurysm of iliac artery: Secondary | ICD-10-CM | POA: Insufficient documentation

## 2012-11-26 NOTE — Progress Notes (Signed)
[facility] [place]   Patient name: Sean Leonard MRN: [id number] DOB: 13-Oct-1945 Sex: male   Referred by: Loleta Chance  Reason for referral:  Chief Complaint  Patient presents with  . New Evaluation    bilateral iliac artery stenosis  and bilateral leg pain, right leg worse    HISTORY OF PRESENT ILLNESS: The patient presents today to discuss   condition of incidental finding of bilateral internal iliac artery aneurysms. He had a CT scan of his abdomen and pelvis for evaluation of abdominal discomfort. He did have a internal iliac artery aneurysms bilaterally. He also had finding of enlarged prostate and is having evaluation of this as well. Fortunately the abdominal discomfort that the indication for the CT scan has resolved. His main complaint currently is of total right leg discomfort. He reports this extends from his buttocks down to his thigh calf and into his foot. This appears to be neuropathic in origin it does not appear to be arterial rest pain. He has no history of aneurysms and no family history of aneurysm. Past Medical History  Diagnosis Date  . Peptic ulcer   . Thyroid disease   . Diverticula of colon   . DDD (degenerative disc disease), lumbar   . Prostatitis   . Iliac artery aneurysm, bilateral     with significant stenosis of right internal iliac artery    Past Surgical History  Procedure Laterality Date  . Thyroidectomy    . Tonsillectomy      History   Social History  . Marital Status: Married    Spouse Name: N/A    Number of Children: N/A  . Years of Education: N/A   Occupational History  . Not on file.   Social History Main Topics  . Smoking status: Former Smoker -- 25 years    Types: Cigars    Quit date: 11/27/2006  . Smokeless tobacco: Never Used     Comment: remote  . Alcohol Use: No     Comment: former. weekend drinker (liqour) before, last etoh 03/2012.   . Drug Use: Yes  . Sexual Activity: Not on file   Other Topics  Concern  . Not on file   Social History Narrative   1 deceased child. 1 living    Family History  Problem Relation Age of Onset  . Colon cancer Neg Hx   . Liver disease Neg Hx   . Hypertension Mother   . Heart disease Mother   . Heart disease Father   . Hyperlipidemia Sister   . Cancer Sister     Allergies as of 11/26/2012  . (No Known Allergies)    Current Outpatient Prescriptions on File Prior to Visit  Medication Sig Dispense Refill  . fish oil-omega-3 fatty acids 1000 MG capsule Take 2 g by mouth daily.      Marland Kitchen levothyroxine (SYNTHROID, LEVOTHROID) 112 MCG tablet Take 112 mcg by mouth every morning.      . Multiple Vitamin (MULTIVITAMIN) capsule Take 1 capsule by mouth daily.      Marland Kitchen omeprazole (PRILOSEC) 20 MG capsule Take 1 capsule (20 mg total) by mouth daily.  30 capsule  3  . pravastatin (PRAVACHOL) 20 MG tablet Take 20 mg by mouth at bedtime.       No current facility-administered medications on file prior to visit.     REVIEW OF SYSTEMS:  Positives indicated with an "X"  CARDIOVASCULAR:  [ ]  chest pain   [ ]  chest pressure   [ ]   palpitations   [ ]  orthopnea   [ ]  dyspnea on exertion   [ ]  claudication   [ ]  rest pain   [ ]  DVT   [ ]  phlebitis PULMONARY:   [ ]  productive cough   [ ]  asthma   [ ]  wheezing NEUROLOGIC:   [ ]  weakness  [ ]  paresthesias  [ ]  aphasia  [ ]  amaurosis  [ ]  dizziness HEMATOLOGIC:   [ ]  bleeding problems   [ ]  clotting disorders MUSCULOSKELETAL:  [ ]  joint pain   [ ]  joint swelling GASTROINTESTINAL: [ ]   blood in stool  [ ]   hematemesis GENITOURINARY:  [ ]   dysuria  [ ]   hematuria PSYCHIATRIC:  [ ]  history of major depression INTEGUMENTARY:  [ ]  rashes  [ ]  ulcers CONSTITUTIONAL:  [ ]  fever   [ ]  chills  PHYSICAL EXAMINATION:  General: The patient is a well-nourished male, in no acute distress. Vital signs are BP 146/92  Pulse 73  Resp 18  Ht 6\' 2"  (1.88 m)  Wt 166 lb (75.297 kg)  BMI 21.3 kg/m2 Pulmonary: There is a good air  exchange bilaterally without wheezing or rales. Abdomen: Soft and non-tender with normal pitch bowel sounds. no aneurysm is palpable and no abdominal bruits Musculoskeletal: There are no major deformities.  There is no significant extremity pain. Neurologic: No focal weakness or paresthesias are detected, Skin: There are no ulcer or rashes noted. Psychiatric: The patient has normal affect. Cardiovascular: There is a regular rate and rhythm without significant murmur appreciated.  Pulse status 2+ radial 2+ femoral 2+ popliteal pulses bilaterally he does have palpable dorsalis pedis and posterior tibial pulse on the left which is normal and diminished pedal pulses on the right  CT scan from July 2014 was reviewed. I reviewed the report and also the actual films. He did have CT scan in 2006 looked at these as well. He did have mild ectasia of the internal iliac arteries then. He does have small bilateral internal iliac artery aneurysms. There is some stenosis in his right internal iliac artery of no significance.  Impression and Plan:  Had a long discussion with the patient and his wife present. I explained that there is minimal if any risk of rupture of his aneurysm that the recurrent small size. I explained that this could enlarge over time and recommended a CT scan in 2 years to rule out any progression and enlargement. He will continue to followup with Dr. Loleta Chance regarding his right leg discomfort.    Corra Kaine [facility] [place] Office: [phone]

## 2012-11-26 NOTE — Addendum Note (Signed)
Addended by: Adria Dill L on: 11/26/2012 03:29 PM   Modules accepted: Orders

## 2012-12-02 ENCOUNTER — Encounter: Payer: Self-pay | Admitting: Gastroenterology

## 2012-12-02 NOTE — Progress Notes (Signed)
Records reviewed. Last EGD and colonoscopy in 2005. Past surgical history updated.  Recommend colonoscopy and upper endoscopy at this time to further evaluate weight loss, abdominal pain, vomiting. Please schedule with Dr. Jena Gauss.  Please request patient followup with PCP regarding? Pulmonary nodule seen on CT abdomen and pelvis in July. Noncontrast CT of the chest was recommended but not done. Send copy of CT to PCP

## 2012-12-07 LAB — COMPREHENSIVE METABOLIC PANEL
AST: 15 U/L
Total Bilirubin: 0.4 mg/dL

## 2012-12-07 LAB — CBC
HCT: 41 %
MCV: 89.7 fL
PSA: 16.9
platelet count: 303

## 2012-12-09 ENCOUNTER — Other Ambulatory Visit: Payer: Self-pay | Admitting: Internal Medicine

## 2012-12-09 DIAGNOSIS — R1013 Epigastric pain: Secondary | ICD-10-CM

## 2012-12-09 DIAGNOSIS — R112 Nausea with vomiting, unspecified: Secondary | ICD-10-CM

## 2012-12-09 DIAGNOSIS — R634 Abnormal weight loss: Secondary | ICD-10-CM

## 2012-12-09 MED ORDER — PEG 3350-KCL-NA BICARB-NACL 420 G PO SOLR: 4000.0000 mL | ORAL | Status: DC

## 2012-12-09 NOTE — Progress Notes (Signed)
Sean Leonard is scheduled for Wed Sept 24th at 10:45 w/RMR and instructions have been mailed and he is aware

## 2012-12-09 NOTE — Progress Notes (Signed)
Pt is aware and it is ok to set up tcs/edg. Benedetto Goad, please schedule.

## 2012-12-11 ENCOUNTER — Other Ambulatory Visit (HOSPITAL_COMMUNITY): Payer: Self-pay | Admitting: Family Medicine

## 2012-12-11 DIAGNOSIS — Z09 Encounter for follow-up examination after completed treatment for conditions other than malignant neoplasm: Secondary | ICD-10-CM

## 2012-12-14 ENCOUNTER — Telehealth: Payer: Self-pay | Admitting: *Deleted

## 2012-12-14 NOTE — Telephone Encounter (Signed)
Pt called stating he is having a TCS on wed. Pt wants to know about taking cabilygese RX. pt wants to know if he needs to take that today.. Please advise [phone]

## 2012-12-14 NOTE — Telephone Encounter (Signed)
Spoke with pt- he found his instruction sheet and said he knew what he was supposed to take now. Pt asked if he could have potato soup, I told him he could not have potato soup,We went over the clear liquid diet,  and he verbalized understanding.

## 2012-12-15 ENCOUNTER — Other Ambulatory Visit (HOSPITAL_COMMUNITY): Payer: Medicare Other

## 2012-12-15 ENCOUNTER — Encounter (HOSPITAL_COMMUNITY): Payer: Self-pay | Admitting: Pharmacy Technician

## 2012-12-16 ENCOUNTER — Ambulatory Visit (HOSPITAL_COMMUNITY)
Admission: RE | Admit: 2012-12-16 | Discharge: 2012-12-16 | Disposition: A | Discharge status: Home / Self Care | Payer: Medicare Other | Source: Ambulatory Visit | Attending: Internal Medicine | Admitting: Internal Medicine

## 2012-12-16 ENCOUNTER — Encounter (HOSPITAL_COMMUNITY)
Admission: RE | Disposition: A | Discharge status: Home / Self Care | Payer: Self-pay | Source: Ambulatory Visit | Attending: Internal Medicine

## 2012-12-16 ENCOUNTER — Encounter (HOSPITAL_COMMUNITY): Payer: Self-pay | Admitting: *Deleted

## 2012-12-16 DIAGNOSIS — K2270 Barrett's esophagus without dysplasia: Secondary | ICD-10-CM | POA: Insufficient documentation

## 2012-12-16 DIAGNOSIS — R1013 Epigastric pain: Secondary | ICD-10-CM

## 2012-12-16 DIAGNOSIS — R634 Abnormal weight loss: Secondary | ICD-10-CM

## 2012-12-16 DIAGNOSIS — D128 Benign neoplasm of rectum: Secondary | ICD-10-CM | POA: Insufficient documentation

## 2012-12-16 DIAGNOSIS — R109 Unspecified abdominal pain: Secondary | ICD-10-CM

## 2012-12-16 DIAGNOSIS — R933 Abnormal findings on diagnostic imaging of other parts of digestive tract: Secondary | ICD-10-CM

## 2012-12-16 DIAGNOSIS — D126 Benign neoplasm of colon, unspecified: Secondary | ICD-10-CM

## 2012-12-16 DIAGNOSIS — K5730 Diverticulosis of large intestine without perforation or abscess without bleeding: Secondary | ICD-10-CM | POA: Insufficient documentation

## 2012-12-16 DIAGNOSIS — K228 Other specified diseases of esophagus: Secondary | ICD-10-CM

## 2012-12-16 DIAGNOSIS — K2940 Chronic atrophic gastritis without bleeding: Secondary | ICD-10-CM | POA: Insufficient documentation

## 2012-12-16 DIAGNOSIS — K621 Rectal polyp: Secondary | ICD-10-CM

## 2012-12-16 DIAGNOSIS — K449 Diaphragmatic hernia without obstruction or gangrene: Secondary | ICD-10-CM | POA: Insufficient documentation

## 2012-12-16 DIAGNOSIS — R112 Nausea with vomiting, unspecified: Secondary | ICD-10-CM

## 2012-12-16 DIAGNOSIS — K620 Anal polyp: Secondary | ICD-10-CM

## 2012-12-16 HISTORY — PX: COLONOSCOPY WITH ESOPHAGOGASTRODUODENOSCOPY (EGD): SHX5779

## 2012-12-16 SURGERY — COLONOSCOPY WITH ESOPHAGOGASTRODUODENOSCOPY (EGD)
Anesthesia: Moderate Sedation

## 2012-12-16 MED ORDER — STERILE WATER FOR IRRIGATION IR SOLN
Status: DC | PRN
  Administered 2012-12-16: 11:00:00

## 2012-12-16 MED ORDER — SODIUM CHLORIDE 0.9 % IV SOLN
INTRAVENOUS | Status: DC
  Administered 2012-12-16: 10:00:00 via INTRAVENOUS

## 2012-12-16 MED ORDER — MIDAZOLAM HCL 5 MG/5ML IJ SOLN
INTRAMUSCULAR | Status: DC | PRN
  Administered 2012-12-16 (×2): 1 mg via INTRAVENOUS
  Administered 2012-12-16: 2 mg via INTRAVENOUS

## 2012-12-16 MED ORDER — ONDANSETRON HCL 4 MG/2ML IJ SOLN
INTRAMUSCULAR | Status: DC | PRN
  Administered 2012-12-16: 4 mg via INTRAVENOUS

## 2012-12-16 MED ORDER — MEPERIDINE HCL 100 MG/ML IJ SOLN
INTRAMUSCULAR | Status: AC
  Filled 2012-12-16: qty 2

## 2012-12-16 MED ORDER — BUTAMBEN-TETRACAINE-BENZOCAINE 2-2-14 % EX AERO
INHALATION_SPRAY | CUTANEOUS | Status: DC | PRN
  Administered 2012-12-16: 2 via TOPICAL

## 2012-12-16 MED ORDER — MEPERIDINE HCL 100 MG/ML IJ SOLN
INTRAMUSCULAR | Status: DC | PRN
  Administered 2012-12-16: 50 mg via INTRAVENOUS
  Administered 2012-12-16 (×2): 25 mg via INTRAVENOUS

## 2012-12-16 MED ORDER — MIDAZOLAM HCL 5 MG/5ML IJ SOLN
INTRAMUSCULAR | Status: AC
  Filled 2012-12-16: qty 10

## 2012-12-16 MED ORDER — ONDANSETRON HCL 4 MG/2ML IJ SOLN
INTRAMUSCULAR | Status: AC
  Filled 2012-12-16: qty 2

## 2012-12-16 NOTE — Op Note (Signed)
[facility] [address] [place], [address]   COLONOSCOPY PROCEDURE REPORT  PATIENT: Sean Leonard, Sean Leonard  MR#:         [id number] BIRTHDATE: 10-16-1945 , 67  yrs. old GENDER: Male ENDOSCOPIST: R.  Roetta Sessions, MD FACP FACG REFERRED BY:  Mirna Mires, M.D. PROCEDURE DATE:  12/16/2012 PROCEDURE:     Ileocolonoscopy with biopsy  INDICATIONS: weight loss  INFORMED CONSENT:  The risks, benefits, alternatives and imponderables including but not limited to bleeding, perforation as well as the possibility of a missed lesion have been reviewed.  The potential for biopsy, lesion removal, etc. have also been discussed.  Questions have been answered.  All parties agreeable. Please see the history and physical in the medical record for more information.  MEDICATIONS: Versed 4 mg IV and Demerol 100 mg IV. Zofran 4 mg IV.  DESCRIPTION OF PROCEDURE:  After a digital rectal exam was performed, the EG-[id number] ([id number]) and EC-[id number] ([id number]) colonoscope was advanced from the anus through the rectum and colon to the area of the cecum, ileocecal valve and appendiceal orifice. The cecum was deeply intubated.  These structures were well-seen and photographed for the record.  From the level of the cecum and ileocecal valve, the scope was slowly and cautiously withdrawn. The mucosal surfaces were carefully surveyed utilizing scope tip deflection to facilitate fold flattening as needed.  The scope was pulled down into the rectum where a thorough examination including retroflexion was performed.    FINDINGS: Inadequate preparation for polyp detection.  (1) 4 mm polyp in the rectum at 3 cm the anal verge; otherwise, or as per. Patient had pancolonic diverticula. There was a second diminutive polyp in the base of the cecum._That of vegetable matter throughout the colon which precluded examination of the finer mucosal detail. Certainly, I did not see any evidence of colitis or  neoplasm. A small or flat lesions may have been obscured by the poor prep.  The distal 5 cm of terminal ileal mucosa appeared normal.  THERAPEUTIC / DIAGNOSTIC MANEUVERS PERFORMED:  The above-mentioned posture cold biopsied/removed  COMPLICATIONS: None  CECAL WITHDRAWAL TIME:  13 minutes  IMPRESSION:  Rectal and colonic polyps  - removed as described above.  Pancolonic diverticulosis.. Inadequate preparation as described above.  RECOMMENDATIONS: Followup on pathology. See EGD report. Patient should  come back in one year for a screening colonoscopy in the presence of a better preparation.   _______________________________ eSigned:  R. Roetta Sessions, MD FACP [facility] 12/16/2012 11:42 AM   CC:    PATIENT NAME:  Nehan, Flaum MR#: [id number]

## 2012-12-16 NOTE — Interval H&P Note (Signed)
History and Physical Interval Note:  12/16/2012 10:37 AM  Sean Leonard  has presented today for surgery, with the diagnosis of ABDOMINAL PAIN, WEIGHT LOSS AND VOMITING  The various methods of treatment have been discussed with the patient and family. After consideration of risks, benefits and other options for treatment, the patient has consented to  Procedure(s) with comments: COLONOSCOPY WITH ESOPHAGOGASTRODUODENOSCOPY (EGD) (N/A) - 10:45 as a surgical intervention .  The patient's history has been reviewed, patient examined, no change in status, stable for surgery.  I have reviewed the patient's chart and labs.  Questions were answered to the patient's satisfaction.     Sean Leonard   No change. EGD and colonoscopy per plan.The risks, benefits, limitations, imponderables and alternatives regarding both EGD and colonoscopy have been reviewed with the patient. Questions have been answered. All parties agreeable.

## 2012-12-16 NOTE — H&P (View-Only) (Signed)
Primary Care Physician:  HILL,GERALD K, MD  Primary Gastroenterologist:  Michael Rourk, MD   Chief Complaint  Patient presents with  . Weight Loss    HPI:  Sean Leonard is a 67 y.o. male here at the request of Dr. Hill for further evaluation of weight loss. Patient states he's lost over 20 pounds since October of last year. Initially he was having trouble getting his Synthroid dose right but has been stable over the past 6 months. At baseline he weighed 185 pounds. Gives a history of peptic ulcer disease back in 2005, diagnosed by Dr. Leroy Smith. States he's been on Carafate since that time. Had a colonoscopy around that time which he reported to be normal. We have requested records. Patient has episodic pain in the left upper quadrant/epigastric region. He's had 2 episodes in the last 2 months. Intermittent vomiting. No diarrhea, constipation, melena, rectal bleeding, dysphagia, heartburn.  CT scan in July showed bilateral internal iliac artery aneurysms, significant stenosis versus near occlusion of the proximal right internal iliac artery. He also had prostatomegaly and ill-defined. Prostatic fat planes. Patient states he was treated for prostatitis. PSA 16.9. He has an appointment with the urologist at [facility] next week. Chest x-ray in December 2013 showed nodular density projected over the upper thoracic spine on the lateral view. Pulmonary nodule not excluded. Noncontrast chest CT recommended. This has not been done.  Patient also complains of right leg pain with ambulation.  Current Outpatient Prescriptions  Medication Sig Dispense Refill  . fish oil-omega-3 fatty acids 1000 MG capsule Take 2 g by mouth daily.      . levothyroxine (SYNTHROID, LEVOTHROID) 112 MCG tablet Take 112 mcg by mouth every morning.      . Multiple Vitamin (MULTIVITAMIN) capsule Take 1 capsule by mouth daily.      . pravastatin (PRAVACHOL) 20 MG tablet Take 20 mg by mouth at bedtime.      . sucralfate  (CARAFATE) 1 G tablet Take 1 g by mouth 2 (two) times daily.       No current facility-administered medications for this visit.    Allergies as of 11/19/2012  . (No Known Allergies)    Past Medical History  Diagnosis Date  . Peptic ulcer   . Thyroid disease   . Diverticula of colon   . DDD (degenerative disc disease), lumbar   . Prostatitis   . Iliac artery aneurysm, bilateral     with significant stenosis of right internal iliac artery    Past Surgical History  Procedure Laterality Date  . Thyroidectomy    . Tonsillectomy      Family History  Problem Relation Age of Onset  . Colon cancer Neg Hx   . Liver disease Neg Hx     History   Social History  . Marital Status: Married    Spouse Name: N/A    Number of Children: N/A  . Years of Education: N/A   Occupational History  . Not on file.   Social History Main Topics  . Smoking status: Former Smoker  . Smokeless tobacco: Not on file     Comment: remote  . Alcohol Use: No     Comment: former. weekend drinker (li[employer]) before, last etoh 03/2012.   . Drug Use: No  . Sexual Activity: Not on file   Other Topics Concern  . Not on file   Social History Narrative   1 deceased child. 1 living      ROS:    General: Negative for anorexia,fever, chills, fatigue, weakness. See history of present illness for weight loss. Eyes: Negative for vision changes.  ENT: Negative for hoarseness, difficulty swallowing , nasal congestion. CV: Negative for chest pain, angina, palpitations, dyspnea on exertion, peripheral edema. Right leg pain with exertion. Respiratory: Negative for dyspnea at rest, dyspnea on exertion, cough, sputum, wheezing.  GI: See history of present illness. GU:  Negative for dysuria, hematuria, urinary incontinence, urinary frequency, nocturnal urination.  MS: Negative for joint pain, low back pain.  Derm: Negative for rash or itching.  Neuro: Negative for weakness, abnormal sensation, seizure, frequent  headaches, memory loss, confusion.  Psych: Negative for anxiety, depression, suicidal ideation, hallucinations.  Endo: Negative for unusual weight change. See history of present illness Heme: Negative for bruising or bleeding. Allergy: Negative for rash or hives.    Physical Examination:  BP 109/65  Pulse 81  Temp(Src) 98.3 F (36.8 C) (Oral)  Ht 6' 2" (1.88 m)  Wt 165 lb 12.8 oz (75.206 kg)  BMI 21.28 kg/m2   General: Well-nourished, well-developed in no acute distress.  Head: Normocephalic, atraumatic.   Eyes: Conjunctiva pink, no icterus. Mouth: Oropharyngeal mucosa moist and pink , no lesions erythema or exudate. Neck: Supple without thyromegaly, masses, or lymphadenopathy.  Lungs: Clear to auscultation bilaterally.  Heart: Regular rate and rhythm, no murmurs rubs or gallops.  Abdomen: Bowel sounds are normal, left upper quadrant/epigastric tenderness. No rib cage pain. nontender, nondistended, no hepatosplenomegaly or masses, no abdominal bruits or    hernia , no rebound or guarding.   Rectal: Not performed. Extremities: No lower extremity edema. No clubbing or deformities.  Neuro: Alert and oriented x 4 , grossly normal neurologically.  Skin: Warm and dry, no rash or jaundice.   Psych: Alert and cooperative, normal mood and affect.  Labs: Lab Results  Component Value Date   WBC 18.6* 10/16/2012   HGB 15.1 10/16/2012   HCT 43.8 10/16/2012   MCV 89.9 10/16/2012   PLT 210 10/16/2012   Lab Results  Component Value Date   CREATININE 0.83 10/16/2012   BUN 14 10/16/2012   NA 137 10/16/2012   K 3.6 10/16/2012   CL 98 10/16/2012   CO2 29 10/16/2012   Lab Results  Component Value Date   ALT 12 10/16/2012   AST 12 10/16/2012   ALKPHOS 78 10/16/2012   BILITOT 1.2 10/16/2012   Lab Results  Component Value Date   LIPASE 25 10/16/2012    Labs on 10/20/2012 white blood cell count 8000, hemoglobin 13.6, hematocrit 40.8, MCV 89.7, platelets 303,000. CEA 1.5, PSA 16.9 Labs from  February 2014. Total bilirubin 0.4, alkaline phosphatase 61, AST 15, ALT 14, albumin 4.2. Imaging Studies: CT A/P with contrast 10/16/12  IMPRESSION:  1. No acute process in the abdomen or pelvis.  2. Urinary bladder collapsed around a Foley catheter. Concurrent  prostatomegaly and ill-defined periprostatic fat planes.  Nonspecific. Consider correlation with PSA level and physical exam  findings to suggest prostatitis.  3. Bilateral internal iliac artery aneurysms. Significant  stenosis versus near occlusion of the proximal right internal iliac  artery.   

## 2012-12-16 NOTE — Op Note (Signed)
[facility] [address] [place], [address]   ENDOSCOPY PROCEDURE REPORT  PATIENT: Sean, Leonard  MR#: [id number] BIRTHDATE: [birth year] , 67  yrs. old GENDER: Male ENDOSCOPIST: R.  Roetta Sessions, MD FACP FACG REFERRED BY:  Mirna Mires, M.D. PROCEDURE DATE:  12/16/2012 PROCEDURE:     EGD with esophageal and gastric biopsy  INDICATIONS:     Weight loss and abdominal pain  INFORMED CONSENT:   The risks, benefits, limitations, alternatives and imponderables have been discussed.  The potential for biopsy, esophogeal dilation, etc. have also been reviewed.  Questions have been answered.  All parties agreeable.  Please see the history and physical in the medical record for more information.  MEDICATIONS:   Versed 3 mg IV and Demerol 100 mg IV ; Zofran 4 mg IV. Cetacaine spray.  DESCRIPTION OF PROCEDURE:   The EG-[id number] ([id number])  endoscope was introduced through the mouth and advanced to the second portion of the duodenum without difficulty or limitations.  The mucosal surfaces were surveyed very carefully during advancement of the scope and upon withdrawal.  Retroflexion view of the proximal stomach and esophagogastric junction was performed.      FINDINGS:  Couple of "tongues" of salmon: Epithelium coming up about 1-1/2 cm into the distal esophagus above the GE junction. Otherwise, esophagus appeared normal. Stomach empty. Small hiatal hernia. Diffuse "mottling" or "fish scale" appearance of the gastric mucosa. No ulcer or infiltrating process. Patent pylorus. Examination of the bulb and second portion revealed "pitting" of the duodenal bulb.  The bulb appeared to have numerous tiny diverticula present.  THERAPEUTIC / DIAGNOSTIC MANEUVERS PERFORMED:  Biopsies the abnormal-appearing distal esophagus and stomach taken for histologic study   COMPLICATIONS:  None  IMPRESSION:  Query short segment Barrett's esophagus-status post biopsy. Hiatal hernia.  Abnormal gastric mucosa of uncertain significance-status post gastric biopsy. Duodenal bulb abnormality - likely not clinically significant.  RECOMMENDATIONS:   Followup on pathology. See colonoscopy report. As recommended previously, patient should have a followup CT of his chest to reevaluate previously noted pulmonary nodule. He is to followup with his attending physician regarding his loose and.    _______________________________ R. Roetta Sessions, MD FACP [facility] eSigned:  R. Roetta Sessions, MD FACP [facility] 12/16/2012 11:14 AM     CC:  PATIENT NAME:  Sean, Leonard MR#: [id number]

## 2012-12-18 ENCOUNTER — Encounter (HOSPITAL_COMMUNITY): Payer: Self-pay | Admitting: Internal Medicine

## 2012-12-19 ENCOUNTER — Encounter: Payer: Self-pay | Admitting: Internal Medicine

## 2012-12-21 ENCOUNTER — Ambulatory Visit (HOSPITAL_COMMUNITY)
Admission: RE | Admit: 2012-12-21 | Discharge: 2012-12-21 | Disposition: A | Discharge status: Home / Self Care | Payer: Medicare Other | Source: Ambulatory Visit | Attending: Family Medicine | Admitting: Family Medicine

## 2012-12-21 DIAGNOSIS — R911 Solitary pulmonary nodule: Secondary | ICD-10-CM | POA: Insufficient documentation

## 2012-12-21 DIAGNOSIS — Z09 Encounter for follow-up examination after completed treatment for conditions other than malignant neoplasm: Secondary | ICD-10-CM | POA: Insufficient documentation

## 2012-12-23 ENCOUNTER — Telehealth: Payer: Self-pay | Admitting: Internal Medicine

## 2012-12-23 NOTE — Telephone Encounter (Signed)
Pt called this afternoon. He has a question about what is "Barrett's". I told him that I would have to have the nurse call him back and she could explain. [phone]

## 2012-12-24 NOTE — Telephone Encounter (Signed)
Spoke with pt and explained results to him.

## 2013-02-15 ENCOUNTER — Telehealth: Payer: Self-pay | Admitting: Internal Medicine

## 2013-02-15 MED ORDER — OMEPRAZOLE 20 MG PO CPDR: 20.0000 mg | DELAYED_RELEASE_CAPSULE | Freq: Every day | ORAL | Status: DC

## 2013-02-15 NOTE — Telephone Encounter (Signed)
Routing to refill box  

## 2013-02-15 NOTE — Telephone Encounter (Signed)
Pt called to asked if we could call in a 90 day supply of Omeprazole to [facility] in [place].

## 2013-02-15 NOTE — Telephone Encounter (Signed)
Done

## 2013-04-16 ENCOUNTER — Other Ambulatory Visit: Payer: Self-pay | Admitting: Gastroenterology

## 2013-04-16 MED ORDER — OMEPRAZOLE 20 MG PO CPDR: 20.0000 mg | DELAYED_RELEASE_CAPSULE | Freq: Every day | ORAL | Status: DC

## 2013-10-19 ENCOUNTER — Ambulatory Visit: Payer: Medicare Other | Admitting: Orthopedic Surgery

## 2013-11-25 ENCOUNTER — Encounter: Payer: Self-pay | Admitting: Internal Medicine

## 2013-12-16 ENCOUNTER — Other Ambulatory Visit: Payer: Self-pay | Admitting: Family Medicine

## 2013-12-16 DIAGNOSIS — R634 Abnormal weight loss: Secondary | ICD-10-CM

## 2013-12-22 ENCOUNTER — Ambulatory Visit
Admission: RE | Admit: 2013-12-22 | Discharge: 2013-12-22 | Disposition: A | Discharge status: Home / Self Care | Payer: Medicare PPO | Source: Ambulatory Visit | Attending: Family Medicine | Admitting: Family Medicine

## 2013-12-22 DIAGNOSIS — R634 Abnormal weight loss: Secondary | ICD-10-CM

## 2013-12-22 MED ORDER — IOHEXOL 300 MG/ML  SOLN
75.0000 mL | Freq: Once | INTRAMUSCULAR | Status: AC | PRN
  Administered 2013-12-22: 75 mL via INTRAVENOUS

## 2013-12-29 ENCOUNTER — Ambulatory Visit (INDEPENDENT_AMBULATORY_CARE_PROVIDER_SITE_OTHER): Payer: Commercial Managed Care - HMO | Admitting: Gastroenterology

## 2013-12-29 ENCOUNTER — Encounter: Payer: Self-pay | Admitting: Gastroenterology

## 2013-12-29 ENCOUNTER — Telehealth: Payer: Self-pay | Admitting: Gastroenterology

## 2013-12-29 VITALS — BP 122/77 | HR 98 | Temp 98.9°F | Ht 73.0 in | Wt 165.0 lb

## 2013-12-29 DIAGNOSIS — D126 Benign neoplasm of colon, unspecified: Secondary | ICD-10-CM

## 2013-12-29 DIAGNOSIS — R972 Elevated prostate specific antigen [PSA]: Secondary | ICD-10-CM

## 2013-12-29 DIAGNOSIS — K2270 Barrett's esophagus without dysplasia: Secondary | ICD-10-CM

## 2013-12-29 NOTE — Assessment & Plan Note (Signed)
Noted on colonoscopy 2014. Due for early interval surveillance due to poor prep last year.   Proceed with TCS with Dr. Gala Romney in near future: the risks, benefits, and alternatives have been discussed with the patient in detail. The patient states understanding and desires to proceed. Add extra day of 1/2 prep

## 2013-12-29 NOTE — Progress Notes (Signed)
Referring Provider: Iona Beard, MD Primary Care Physician:  Maggie Font, MD Primary GI: Dr. Gala Romney   Chief Complaint  Patient presents with  . Follow-up    HPI:   Sean Leonard is a pleasant 68 year old male presenting today to schedule 1 year surveillance EGD for Barrett's and an early interval colonoscopy due to poor prep on last exam; adenomatous polyp noted last year.   Appetite not that good. Stated he used to weigh 186 pounds. Now 166. Last year around 165-170. Believes he weighed in the 180s about 3 years ago. Unintentional weight loss. Early satiety.  If he really likes something, will eat more of it.   Past Medical History  Diagnosis Date  . Peptic ulcer   . Thyroid disease   . Diverticula of colon   . DDD (degenerative disc disease), lumbar   . Prostatitis   . Iliac artery aneurysm, bilateral     with significant stenosis of right internal iliac artery    Past Surgical History  Procedure Laterality Date  . Thyroidectomy    . Tonsillectomy    . Colonoscopy  April 2005    Dr. Irving Shows. Small internal hemorrhoids. Multiple small scattered diverticula in the cecum and descending colon.  . Esophagogastroduodenoscopy  July 2005    Dr. Irving Shows grade 2 esophagitis at the GE junction. Acute gastritis. Multiple acute active, deep, irregular, friable ulcers in the antrum and body of the stomach. 2 active, deep, friable, irregular ulcers in the duodenum. Pathology not available.  . Colonoscopy with esophagogastroduodenoscopy (egd) N/A 12/16/2012    Dr. Gala Romney: rectal and colonic polyps with inadequate preparation, tubular adenoma. EGD with biopsy proven short segment Barrett's esophagus, hiatal hernia, mild chronic inactive gastritis     Current Outpatient Prescriptions  Medication Sig Dispense Refill  . fish oil-omega-3 fatty acids 1000 MG capsule Take 2 g by mouth daily.      Marland Kitchen HYDROcodone-acetaminophen (NORCO/VICODIN) 5-325 MG per tablet Take 1 tablet by  mouth every 6 (six) hours as needed for pain.      Marland Kitchen levothyroxine (SYNTHROID, LEVOTHROID) 112 MCG tablet Take 112 mcg by mouth every morning.      . Multiple Vitamin (MULTIVITAMIN WITH MINERALS) TABS tablet Take 1 tablet by mouth daily.      Marland Kitchen omeprazole (PRILOSEC) 20 MG capsule Take 1 capsule (20 mg total) by mouth daily.  90 capsule  3  . pravastatin (PRAVACHOL) 20 MG tablet Take 20 mg by mouth at bedtime.       No current facility-administered medications for this visit.    Allergies as of 12/29/2013  . (No Known Allergies)    Family History  Problem Relation Age of Onset  . Colon cancer Neg Hx   . Liver disease Neg Hx   . Hypertension Mother   . Heart disease Mother   . Heart disease Father   . Hyperlipidemia Sister   . Cancer Sister     History   Social History  . Marital Status: Married    Spouse Name: N/A    Number of Children: N/A  . Years of Education: N/A   Social History Main Topics  . Smoking status: Former Smoker -- 25 years    Types: Cigars    Quit date: 11/27/2006  . Smokeless tobacco: Never Used     Comment: Quit in 2009  . 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   . Drug Use: No  . Sexual Activity: Not on file   Other Topics Concern  . Not on file   Social History Narrative   1 deceased child. 1 living    Review of Systems: As mentioned in HPI  Physical Exam: BP 122/77  Pulse 98  Temp(Src) 98.9 F (37.2 C)  Ht 6\' 1"  (1.854 m)  Wt 165 lb (74.844 kg)  BMI 21.77 kg/m2 General:   Alert and oriented. No distress noted. Pleasant and cooperative.  Head:  Normocephalic and atraumatic. Eyes:  Conjuctiva clear without scleral icterus. Mouth:  Oral mucosa pink and moist.  Heart:  S1, S2 present without murmurs, rubs, or gallops.  Abdomen:  +BS, soft, non-tender and non-distended. No rebound or guarding. No HSM or masses noted. Msk:  Symmetrical without gross  deformities. Normal posture. Extremities:  Without edema. Neurologic:  Alert and  oriented x4;  grossly normal neurologically. Skin:  Intact without significant lesions or rashes. Psych:  Alert and cooperative. Normal mood and affect.  CT July 2014 IMPRESSION:  1. No acute process in the abdomen or pelvis.  2. Urinary bladder collapsed around a Foley catheter. Concurrent  prostatomegaly and ill-defined periprostatic fat planes.  Nonspecific. Consider correlation with PSA level and physical exam  findings to suggest prostatitis.  3. Bilateral internal iliac artery aneurysms. Significant  stenosis versus near occlusion of the proximal right internal iliac  artery.

## 2013-12-29 NOTE — Telephone Encounter (Signed)
Ginger: Can we add an additional day of 1/2 prep before this patient's colonoscopy? Poor prep last year. So 2 days prior would be 1/2 prep and clear liquids, then day before like normal prep is.   Thanks!

## 2013-12-29 NOTE — Assessment & Plan Note (Signed)
Noted in 2014 per last office note. Treated for prostatitis in the past. CT with signs of prostatomegaly in 2014. Unclear if ever seen by urology. Will need to investigate this further.

## 2013-12-29 NOTE — Assessment & Plan Note (Signed)
68 year old male with Barrett's esophagus diagnosed 2014, due now for 1 year surveillance. Continued reports of decreased appetite and early satiety. No other concerning upper GI symptoms.   Proceed with upper endoscopy in the near future with Dr. Gala Romney. The risks, benefits, and alternatives have been discussed in detail with patient. They have stated understanding and desire to proceed.  Continue PPI daily

## 2013-12-29 NOTE — Patient Instructions (Signed)
We have set you up for a colonoscopy and upper endoscopy with Dr. Gala Romney.  I would like you to return in 3 months for a weight check.

## 2013-12-30 ENCOUNTER — Other Ambulatory Visit: Payer: Self-pay

## 2013-12-30 MED ORDER — PEG 3350-KCL-NA BICARB-NACL 420 G PO SOLR: 4000.0000 mL | ORAL | Status: DC

## 2013-12-30 NOTE — Progress Notes (Signed)
cc'ed to pcp °

## 2014-01-03 NOTE — Telephone Encounter (Signed)
Clear liquids for 2 days prior. Standard prep day prior. Thanks!

## 2014-01-03 NOTE — Telephone Encounter (Signed)
Can you please verify how you would like to do his prep.

## 2014-01-03 NOTE — Telephone Encounter (Signed)
Instructions are in the mail 

## 2014-01-04 ENCOUNTER — Telehealth: Payer: Self-pay | Admitting: Internal Medicine

## 2014-01-04 ENCOUNTER — Other Ambulatory Visit: Payer: Self-pay

## 2014-01-04 DIAGNOSIS — K2270 Barrett's esophagus without dysplasia: Secondary | ICD-10-CM

## 2014-01-04 DIAGNOSIS — Z86010 Personal history of colonic polyps: Secondary | ICD-10-CM

## 2014-01-04 MED ORDER — PEG 3350-KCL-NA BICARB-NACL 420 G PO SOLR: 4000.0000 mL | ORAL | Status: DC

## 2014-01-04 NOTE — Telephone Encounter (Signed)
Talked with patient today and answered his questions

## 2014-01-04 NOTE — Telephone Encounter (Signed)
Patient called yesterday saying he was returning a call. While I had him on hold to get GF he hung up. He is calling back again today wanting to know what we wanted yesterday. Please call him back at [phone]

## 2014-01-05 ENCOUNTER — Encounter (HOSPITAL_COMMUNITY): Payer: Self-pay | Admitting: Pharmacy Technician

## 2014-01-18 ENCOUNTER — Encounter (HOSPITAL_COMMUNITY)
Admission: RE | Disposition: A | Discharge status: Home / Self Care | Payer: Self-pay | Source: Ambulatory Visit | Attending: Internal Medicine

## 2014-01-18 ENCOUNTER — Encounter (HOSPITAL_COMMUNITY): Payer: Self-pay | Admitting: *Deleted

## 2014-01-18 ENCOUNTER — Ambulatory Visit (HOSPITAL_COMMUNITY)
Admission: RE | Admit: 2014-01-18 | Discharge: 2014-01-18 | Disposition: A | Discharge status: Home / Self Care | Payer: Commercial Managed Care - HMO | Source: Ambulatory Visit | Attending: Internal Medicine | Admitting: Internal Medicine

## 2014-01-18 DIAGNOSIS — E079 Disorder of thyroid, unspecified: Secondary | ICD-10-CM | POA: Diagnosis not present

## 2014-01-18 DIAGNOSIS — Z86010 Personal history of colonic polyps: Secondary | ICD-10-CM | POA: Insufficient documentation

## 2014-01-18 DIAGNOSIS — K621 Rectal polyp: Secondary | ICD-10-CM | POA: Diagnosis not present

## 2014-01-18 DIAGNOSIS — K5730 Diverticulosis of large intestine without perforation or abscess without bleeding: Secondary | ICD-10-CM | POA: Diagnosis not present

## 2014-01-18 DIAGNOSIS — Z1211 Encounter for screening for malignant neoplasm of colon: Secondary | ICD-10-CM | POA: Insufficient documentation

## 2014-01-18 DIAGNOSIS — K2270 Barrett's esophagus without dysplasia: Secondary | ICD-10-CM | POA: Insufficient documentation

## 2014-01-18 DIAGNOSIS — K449 Diaphragmatic hernia without obstruction or gangrene: Secondary | ICD-10-CM | POA: Diagnosis not present

## 2014-01-18 DIAGNOSIS — Z79899 Other long term (current) drug therapy: Secondary | ICD-10-CM | POA: Diagnosis not present

## 2014-01-18 HISTORY — PX: COLONOSCOPY: SHX5424

## 2014-01-18 HISTORY — PX: ESOPHAGOGASTRODUODENOSCOPY: SHX5428

## 2014-01-18 SURGERY — COLONOSCOPY
Anesthesia: Moderate Sedation

## 2014-01-18 MED ORDER — MIDAZOLAM HCL 5 MG/5ML IJ SOLN
INTRAMUSCULAR | Status: DC | PRN
  Administered 2014-01-18: 2 mg via INTRAVENOUS
  Administered 2014-01-18 (×5): 1 mg via INTRAVENOUS

## 2014-01-18 MED ORDER — ONDANSETRON HCL 4 MG/2ML IJ SOLN
INTRAMUSCULAR | Status: DC | PRN
  Administered 2014-01-18: 4 mg via INTRAVENOUS

## 2014-01-18 MED ORDER — MIDAZOLAM HCL 5 MG/5ML IJ SOLN
INTRAMUSCULAR | Status: AC
  Filled 2014-01-18: qty 10

## 2014-01-18 MED ORDER — LIDOCAINE VISCOUS 2 % MT SOLN
OROMUCOSAL | Status: DC | PRN
  Administered 2014-01-18: 4 mL via OROMUCOSAL

## 2014-01-18 MED ORDER — SODIUM CHLORIDE 0.9 % IV SOLN
INTRAVENOUS | Status: DC
  Administered 2014-01-18: 1000 mL via INTRAVENOUS

## 2014-01-18 MED ORDER — ONDANSETRON HCL 4 MG/2ML IJ SOLN
INTRAMUSCULAR | Status: AC
  Filled 2014-01-18: qty 2

## 2014-01-18 MED ORDER — MEPERIDINE HCL 100 MG/ML IJ SOLN
INTRAMUSCULAR | Status: DC | PRN
  Administered 2014-01-18 (×4): 25 mg via INTRAVENOUS

## 2014-01-18 MED ORDER — STERILE WATER FOR IRRIGATION IR SOLN
Status: DC | PRN
  Administered 2014-01-18: 12:00:00

## 2014-01-18 MED ORDER — MEPERIDINE HCL 100 MG/ML IJ SOLN
INTRAMUSCULAR | Status: AC
  Filled 2014-01-18: qty 2

## 2014-01-18 NOTE — Interval H&P Note (Signed)
History and Physical Interval Note:  01/18/2014 11:45 AM  Sean Leonard  has presented today for surgery, with the diagnosis of barrett's esophagus/history of colon polyps  The various methods of treatment have been discussed with the patient and family. After consideration of risks, benefits and other options for treatment, the patient has consented to  Procedure(s) with comments: COLONOSCOPY (N/A) - 1100 ESOPHAGOGASTRODUODENOSCOPY (EGD) (N/A) as a surgical intervention .  The patient's history has been reviewed, patient examined, no change in status, stable for surgery.  I have reviewed the patient's chart and labs.  Questions were answered to the patient's satisfaction.     Sean Leonard   No change. EGD and colonoscopy per plan.The risks, benefits, limitations, imponderables and alternatives regarding both EGD and colonoscopy have been reviewed with the patient. Questions have been answered. All parties agreeable.

## 2014-01-18 NOTE — H&P (View-Only) (Signed)
Referring Provider: Iona Beard, MD Primary Care Physician:  Maggie Font, MD Primary GI: Dr. Gala Romney   Chief Complaint  Patient presents with  . Follow-up    HPI:   Sean Leonard is a pleasant 68 year old male presenting today to schedule 1 year surveillance EGD for Barrett's and an early interval colonoscopy due to poor prep on last exam; adenomatous polyp noted last year.   Appetite not that good. Stated he used to weigh 186 pounds. Now 166. Last year around 165-170. Believes he weighed in the 180s about 3 years ago. Unintentional weight loss. Early satiety.  If he really likes something, will eat more of it.   Past Medical History  Diagnosis Date  . Peptic ulcer   . Thyroid disease   . Diverticula of colon   . DDD (degenerative disc disease), lumbar   . Prostatitis   . Iliac artery aneurysm, bilateral     with significant stenosis of right internal iliac artery    Past Surgical History  Procedure Laterality Date  . Thyroidectomy    . Tonsillectomy    . Colonoscopy  April 2005    Dr. Irving Shows. Small internal hemorrhoids. Multiple small scattered diverticula in the cecum and descending colon.  . Esophagogastroduodenoscopy  July 2005    Dr. Irving Shows grade 2 esophagitis at the GE junction. Acute gastritis. Multiple acute active, deep, irregular, friable ulcers in the antrum and body of the stomach. 2 active, deep, friable, irregular ulcers in the duodenum. Pathology not available.  . Colonoscopy with esophagogastroduodenoscopy (egd) N/A 12/16/2012    Dr. Gala Romney: rectal and colonic polyps with inadequate preparation, tubular adenoma. EGD with biopsy proven short segment Barrett's esophagus, hiatal hernia, mild chronic inactive gastritis     Current Outpatient Prescriptions  Medication Sig Dispense Refill  . fish oil-omega-3 fatty acids 1000 MG capsule Take 2 g by mouth daily.      Marland Kitchen HYDROcodone-acetaminophen (NORCO/VICODIN) 5-325 MG per tablet Take 1 tablet by  mouth every 6 (six) hours as needed for pain.      Marland Kitchen levothyroxine (SYNTHROID, LEVOTHROID) 112 MCG tablet Take 112 mcg by mouth every morning.      . Multiple Vitamin (MULTIVITAMIN WITH MINERALS) TABS tablet Take 1 tablet by mouth daily.      Marland Kitchen omeprazole (PRILOSEC) 20 MG capsule Take 1 capsule (20 mg total) by mouth daily.  90 capsule  3  . pravastatin (PRAVACHOL) 20 MG tablet Take 20 mg by mouth at bedtime.       No current facility-administered medications for this visit.    Allergies as of 12/29/2013  . (No Known Allergies)    Family History  Problem Relation Age of Onset  . Colon cancer Neg Hx   . Liver disease Neg Hx   . Hypertension Mother   . Heart disease Mother   . Heart disease Father   . Hyperlipidemia Sister   . Cancer Sister     History   Social History  . Marital Status: Married    Spouse Name: N/A    Number of Children: N/A  . Years of Education: N/A   Social History Main Topics  . Smoking status: Former Smoker -- 25 years    Types: Cigars    Quit date: 11/27/2006  . Smokeless tobacco: Never Used     Comment: Quit in 2009  . 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   . Drug Use: No  . Sexual Activity: Not on file   Other Topics Concern  . Not on file   Social History Narrative   1 deceased child. 1 living    Review of Systems: As mentioned in HPI  Physical Exam: BP 122/77  Pulse 98  Temp(Src) 98.9 F (37.2 C)  Ht 6\' 1"  (1.854 m)  Wt 165 lb (74.844 kg)  BMI 21.77 kg/m2 General:   Alert and oriented. No distress noted. Pleasant and cooperative.  Head:  Normocephalic and atraumatic. Eyes:  Conjuctiva clear without scleral icterus. Mouth:  Oral mucosa pink and moist.  Heart:  S1, S2 present without murmurs, rubs, or gallops.  Abdomen:  +BS, soft, non-tender and non-distended. No rebound or guarding. No HSM or masses noted. Msk:  Symmetrical without gross  deformities. Normal posture. Extremities:  Without edema. Neurologic:  Alert and  oriented x4;  grossly normal neurologically. Skin:  Intact without significant lesions or rashes. Psych:  Alert and cooperative. Normal mood and affect.  CT July 2014 IMPRESSION:  1. No acute process in the abdomen or pelvis.  2. Urinary bladder collapsed around a Foley catheter. Concurrent  prostatomegaly and ill-defined periprostatic fat planes.  Nonspecific. Consider correlation with PSA level and physical exam  findings to suggest prostatitis.  3. Bilateral internal iliac artery aneurysms. Significant  stenosis versus near occlusion of the proximal right internal iliac  artery.

## 2014-01-18 NOTE — Discharge Instructions (Addendum)
Colonoscopy Discharge Instructions  Read the instructions outlined below and refer to this sheet in the next few weeks. These discharge instructions provide you with general information on caring for yourself after you leave the hospital. Your doctor may also give you specific instructions. While your treatment has been planned according to the most current medical practices available, unavoidable complications occasionally occur. If you have any problems or questions after discharge, call Dr. Gala Romney at [phone]. ACTIVITY  You may resume your regular activity, but move at a slower pace for the next 24 hours.   Take frequent rest periods for the next 24 hours.   Walking will help get rid of the air and reduce the bloated feeling in your belly (abdomen).   No driving for 24 hours (because of the medicine (anesthesia) used during the test).    Do not sign any important legal documents or operate any machinery for 24 hours (because of the anesthesia used during the test).  NUTRITION  Drink plenty of fluids.   You may resume your normal diet as instructed by your doctor.   Begin with a light meal and progress to your normal diet. Heavy or fried foods are harder to digest and may make you feel sick to your stomach (nauseated).   Avoid alcoholic beverages for 24 hours or as instructed.  MEDICATIONS  You may resume your normal medications unless your doctor tells you otherwise.  WHAT YOU CAN EXPECT TODAY  Some feelings of bloating in the abdomen.   Passage of more gas than usual.   Spotting of blood in your stool or on the toilet paper.  IF YOU HAD POLYPS REMOVED DURING THE COLONOSCOPY:  No aspirin products for 7 days or as instructed.   No alcohol for 7 days or as instructed.   Eat a soft diet for the next 24 hours.  FINDING OUT THE RESULTS OF YOUR TEST Not all test results are available during your visit. If your test results are not back during the visit, make an appointment  with your caregiver to find out the results. Do not assume everything is normal if you have not heard from your caregiver or the medical facility. It is important for you to follow up on all of your test results.  SEEK IMMEDIATE MEDICAL ATTENTION IF:  You have more than a spotting of blood in your stool.   Your belly is swollen (abdominal distention).   You are nauseated or vomiting.   You have a temperature over 101.  You have abdominal pain or discomfort that is severe or gets worse throughout the day. EGD Discharge instructions Please read the instructions outlined below and refer to this sheet in the next few weeks. These discharge instructions provide you with general information on caring for yourself after you leave the hospital. Your doctor may also give you specific instructions. While your treatment has been planned according to the most current medical practices available, unavoidable complications occasionally occur. If you have any problems or questions after discharge, please call your doctor. ACTIVITY You may resume your regular activity but move at a slower pace for the next 24 hours.  Take frequent rest periods for the next 24 hours.  Walking will help expel (get rid of) the air and reduce the bloated feeling in your abdomen.  No driving for 24 hours (because of the anesthesia (medicine) used during the test).  You may shower.  Do not sign any important legal documents or operate any machinery for 24  hours (because of the anesthesia used during the test).  NUTRITION Drink plenty of fluids.  You may resume your normal diet.  Begin with a light meal and progress to your normal diet.  Avoid alcoholic beverages for 24 hours or as instructed by your caregiver.  MEDICATIONS You may resume your normal medications unless your caregiver tells you otherwise.  WHAT YOU CAN EXPECT TODAY You may experience abdominal discomfort such as a feeling of fullness or gas pains.   FOLLOW-UP Your doctor will discuss the results of your test with you.  SEEK IMMEDIATE MEDICAL ATTENTION IF ANY OF THE FOLLOWING OCCUR: Excessive nausea (feeling sick to your stomach) and/or vomiting.  Severe abdominal pain and distention (swelling).  Trouble swallowing.  Temperature over 101 F (37.8 C).  Rectal bleeding or vomiting of blood.    Continue omeprazole 20 mg daily.  Polyp and diverticulosis information provided Further recommendations to follow pending review of pathology report     Diverticulosis Diverticulosis is the condition that develops when small pouches (diverticula) form in the wall of your colon. Your colon, or large intestine, is where water is absorbed and stool is formed. The pouches form when the inside layer of your colon pushes through weak spots in the outer layers of your colon. CAUSES  No one knows exactly what causes diverticulosis. RISK FACTORS Being older than 19. Your risk for this condition increases with age. Diverticulosis is rare in people younger than 40 years. By age 21, almost everyone has it. Eating a low-fiber diet. Being frequently constipated. Being overweight. Not getting enough exercise. Smoking. Taking over-the-counter pain medicines, like aspirin and ibuprofen. SYMPTOMS  Most people with diverticulosis do not have symptoms. DIAGNOSIS  Because diverticulosis often has no symptoms, health care providers often discover the condition during an exam for other colon problems. In many cases, a health care provider will diagnose diverticulosis while using a flexible scope to examine the colon (colonoscopy). TREATMENT  If you have never developed an infection related to diverticulosis, you may not need treatment. If you have had an infection before, treatment may include: Eating more fruits, vegetables, and grains. Taking a fiber supplement. Taking a live bacteria supplement (probiotic). Taking medicine to relax your colon. HOME  CARE INSTRUCTIONS  Drink at least 6-8 glasses of water each day to prevent constipation. Try not to strain when you have a bowel movement. Keep all follow-up appointments. If you have had an infection before: Increase the fiber in your diet as directed by your health care provider or dietitian. Take a dietary fiber supplement if your health care provider approves. Only take medicines as directed by your health care provider. SEEK MEDICAL CARE IF:  You have abdominal pain. You have bloating. You have cramps. You have not gone to the bathroom in 3 days. SEEK IMMEDIATE MEDICAL CARE IF:  Your pain gets worse. Yourbloating becomes very bad. You have a fever or chills, and your symptoms suddenly get worse. You begin vomiting. You have bowel movements that are bloody or black. MAKE SURE YOU: Understand these instructions. Will watch your condition. Will get help right away if you are not doing well or get worse. Document Released: 12/07/2003 Document Revised: 03/16/2013 Document Reviewed: 02/03/2013 [facility] Patient Information 2015 [place], [place]. This information is not intended to replace advice given to you by your health care provider. Make sure you discuss any questions you have with your health care provider. Colon Polyps Polyps are lumps of extra tissue growing inside the body. Polyps can  grow in the large intestine (colon). Most colon polyps are noncancerous (benign). However, some colon polyps can become cancerous over time. Polyps that are larger than a pea may be harmful. To be safe, caregivers remove and test all polyps. CAUSES  Polyps form when mutations in the genes cause your cells to grow and divide even though no more tissue is needed. RISK FACTORS There are a number of risk factors that can increase your chances of getting colon polyps. They include:  Being older than 50 years.  Family history of colon polyps or colon cancer.  Long-term colon diseases, such as  colitis or Crohn disease.  Being overweight.  Smoking.  Being inactive.  Drinking too much alcohol. SYMPTOMS  Most small polyps do not cause symptoms. If symptoms are present, they may include:  Blood in the stool. The stool may look dark red or black.  Constipation or diarrhea that lasts longer than 1 week. DIAGNOSIS People often do not know they have polyps until their caregiver finds them during a regular checkup. Your caregiver can use 4 tests to check for polyps:  Digital rectal exam. The caregiver wears gloves and feels inside the rectum. This test would find polyps only in the rectum.  Barium enema. The caregiver puts a liquid called barium into your rectum before taking X-rays of your colon. Barium makes your colon look white. Polyps are dark, so they are easy to see in the X-ray pictures.  Sigmoidoscopy. A thin, flexible tube (sigmoidoscope) is placed into your rectum. The sigmoidoscope has a light and tiny camera in it. The caregiver uses the sigmoidoscope to look at the last third of your colon.  Colonoscopy. This test is like sigmoidoscopy, but the caregiver looks at the entire colon. This is the most common method for finding and removing polyps. TREATMENT  Any polyps will be removed during a sigmoidoscopy or colonoscopy. The polyps are then tested for cancer. PREVENTION  To help lower your risk of getting more colon polyps:  Eat plenty of fruits and vegetables. Avoid eating fatty foods.  Do not smoke.  Avoid drinking alcohol.  Exercise every day.  Lose weight if recommended by your caregiver.  Eat plenty of calcium and folate. Foods that are rich in calcium include milk, cheese, and broccoli. Foods that are rich in folate include chickpeas, kidney beans, and spinach. HOME CARE INSTRUCTIONS Keep all follow-up appointments as directed by your caregiver. You may need periodic exams to check for polyps. SEEK MEDICAL CARE IF: You notice bleeding during a bowel  movement. Document Released: 12/06/2003 Document Revised: 06/03/2011 Document Reviewed: 05/21/2011 [facility] Patient Information 2015 [place], [place]. This information is not intended to replace advice given to you by your health care provider. Make sure you discuss any questions you have with your health care provider.

## 2014-01-18 NOTE — Op Note (Signed)
[facility] [address] [place], [address]   ENDOSCOPY PROCEDURE REPORT  PATIENT: Sean Leonard, Sean Leonard  MR#: [id number] BIRTHDATE: 09-25-1945 , 88  yrs. old GENDER: male ENDOSCOPIST: R.  Garfield Cornea, MD FACP FACG REFERRED BY:  Iona Beard, M.D. PROCEDURE DATE:  2014-01-20 PROCEDURE:  EGD w/ biopsy INDICATIONS:  Barrett's surveillance examination. MEDICATIONS: Versed 5 mg IV in Demerol 75 mg IV in divided doses. Xylocaine gel orally.  Zofran 4 mg IV. ASA CLASS:      Class II  CONSENT: The risks, benefits, limitations, alternatives and imponderables have been discussed.  The potential for biopsy, esophogeal dilation, etc. have also been reviewed.  Questions have been answered.  All parties agreeable.  Please see the history and physical in the medical record for more information.  DESCRIPTION OF PROCEDURE: After the risks benefits and alternatives of the procedure were thoroughly explained, informed consent was obtained.  The [id number] ([id number]) endoscope was introduced through the mouth and advanced to the second portion of the duodenum , limited by Without limitations. The instrument was slowly withdrawn as the mucosa was fully examined.    (3) 2 cm "tongues" of salmon-colored epithelium coming up from the GE junction.  No esophagitis.  No nodularity. Stomach empty. Small hiatal hernia. Normal gastric mucosa. Patent pylorus. 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  Biopsies of the abnormal distal esophagus taken for histologic study.          The scope was then withdrawn from the patient and the procedure completed.  COMPLICATIONS: There were no immediate complications.  ENDOSCOPIC IMPRESSION: Abnormal distal esophagus consistent with prior history of short segment Barrett's esophagus?"status post biopsy. Hiatal hernia.   RECOMMENDATIONS: Continue omeprazole 20 mg daily.  Follow-up on  pathology.  See colonoscopy report.   Please note a technical issue precluded development of endoscopic photos taken today.  REPEAT EXAM:  eSigned:  R. Garfield Cornea, MD Rosalita Chessman [facility] 01-20-2014 12:17 PM    CC:  CPT CODES: ICD CODES:  The ICD and CPT codes recommended by this software are interpretations from the data that the clinical staff has captured with the software.  The verification of the translation of this report to the ICD and CPT codes and modifiers is the sole responsibility of the health care institution and practicing physician where this report was generated.  [place]. will not be held responsible for the validity of the ICD and CPT codes included on this report.  AMA assumes no liability for data contained or not contained herein. CPT is a Designer, television/film set of the [employer].

## 2014-01-18 NOTE — Op Note (Signed)
[facility], [place] [address] [place], [address]   COLONOSCOPY PROCEDURE REPORT  PATIENT: Sean Leonard, Sean Leonard  MR#: [id number] BIRTHDATE: 07-08-1945 , 69  yrs. old GENDER: male ENDOSCOPIST: R.  Garfield Cornea, MD FACP [facility] REFERRED SA:YTKZSW Hill, M.D. PROCEDURE DATE:  Jan 21, 2014 PROCEDURE:   Colonoscopy with snare polypectomy INDICATIONS:Adequate preparation.  (2) 5 mm polyps in the rectum at the 12 cm.  These appeared to be hyperplastic; otherwise, the remainder of the rectal mucosa appeared normal.  Patient had scattered pancolonic diverticula; the remainder of the colonic mucosa appeared normal specimen anywhere else that was. MEDICATIONS: Versed 7 mg IV and Demerol 100 mg IV in divided doses. Zofran 4 mg IV. ASA CLASS:       Class II  CONSENT: The risks, benefits, alternatives and imponderables including but not limited to bleeding, perforation as well as the possibility of a missed lesion have been reviewed.  The potential for biopsy, lesion removal, etc. have also been discussed. Questions have been answered.  All parties agreeable.  Please see the history and physical in the medical record for more information.  DESCRIPTION OF PROCEDURE:   After the risks benefits and alternatives of the procedure were thoroughly explained, informed consent was obtained.  The digital rectal exam revealed no abnormalities of the rectum.   The [id number] ([id number])  endoscope was introduced through the anus and advanced to the cecum, which was identified by both the appendix and ileocecal valve. No adverse events experienced.   The quality of the prep was adequate.  The instrument was then slowly withdrawn as the colon was fully examined.      COLON FINDINGS: Scattered pancolonic diverticula; the remainder colonic mucosa appeared normal.  The 2 polyps in the rectum were removed cleanly with a hot snare cautery. Recovery was in process time of this dictation.       .  Withdrawal time=17 minutes 0 seconds.  The scope was withdrawn and the procedure completed. COMPLICATIONS: There were no immediate complications.  ENDOSCOPIC IMPRESSION: Rectal polyps (2) removed as described above. Colonic diverticulosis.  RECOMMENDATIONS: Follow up on pathology. See EGD report.  eSigned:  R. Garfield Cornea, MD Rosalita Chessman [facility] January 21, 2014 12:59 PM   cc:  CPT CODES: ICD CODES:  The ICD and CPT codes recommended by this software are interpretations from the data that the clinical staff has captured with the software.  The verification of the translation of this report to the ICD and CPT codes and modifiers is the sole responsibility of the health care institution and practicing physician where this report was generated.  [place]. will not be held responsible for the validity of the ICD and CPT codes included on this report.  AMA assumes no liability for data contained or not contained herein. CPT is a Designer, television/film set of the [employer].  PATIENT NAME:  Sean Leonard, Sean Leonard MR#: [id number]

## 2014-01-20 ENCOUNTER — Encounter (HOSPITAL_COMMUNITY): Payer: Self-pay | Admitting: Internal Medicine

## 2014-01-24 ENCOUNTER — Encounter: Payer: Self-pay | Admitting: Internal Medicine

## 2014-03-21 ENCOUNTER — Other Ambulatory Visit: Payer: Self-pay

## 2014-03-21 MED ORDER — OMEPRAZOLE 20 MG PO CPDR: 20.0000 mg | DELAYED_RELEASE_CAPSULE | Freq: Every day | ORAL | Status: DC

## 2014-03-22 ENCOUNTER — Encounter: Payer: Self-pay | Admitting: Internal Medicine

## 2014-03-23 ENCOUNTER — Telehealth: Payer: Self-pay

## 2014-03-23 NOTE — Telephone Encounter (Signed)
Received a request from [facility] for the Omeprazole 20 mg that was refilled by Laban Emperor, NP on 03/21/2014, #90 with 3 refills.  It was sent to [facility] and not [facility].  I called [employer] to see if we could cancel the order and it has already been mailed. ( I spoke to [place] at [phone]).  I called pt and LMOM for a return call to see if it will be OK to leave there for the refills, etc.

## 2014-03-24 NOTE — Telephone Encounter (Signed)
Pt is aware that the Rx is being shipped from [facility] and said that is fine, it will be free from them. If he does run out before they get here he will ask pharmacist to help him pick out a few OTC.

## 2014-03-29 ENCOUNTER — Other Ambulatory Visit: Payer: Self-pay

## 2014-03-31 MED ORDER — OMEPRAZOLE 20 MG PO CPDR: 20.0000 mg | DELAYED_RELEASE_CAPSULE | Freq: Every day | ORAL | Status: DC

## 2014-06-01 DIAGNOSIS — Z79899 Other long term (current) drug therapy: Secondary | ICD-10-CM | POA: Diagnosis not present

## 2014-06-06 DIAGNOSIS — E785 Hyperlipidemia, unspecified: Secondary | ICD-10-CM | POA: Diagnosis not present

## 2014-06-06 DIAGNOSIS — E039 Hypothyroidism, unspecified: Secondary | ICD-10-CM | POA: Diagnosis not present

## 2014-06-06 DIAGNOSIS — I2510 Atherosclerotic heart disease of native coronary artery without angina pectoris: Secondary | ICD-10-CM | POA: Diagnosis not present

## 2014-09-19 ENCOUNTER — Other Ambulatory Visit: Payer: Self-pay

## 2014-10-04 DIAGNOSIS — E784 Other hyperlipidemia: Secondary | ICD-10-CM | POA: Diagnosis not present

## 2014-10-04 DIAGNOSIS — H2510 Age-related nuclear cataract, unspecified eye: Secondary | ICD-10-CM | POA: Diagnosis not present

## 2014-10-04 DIAGNOSIS — Z0100 Encounter for examination of eyes and vision without abnormal findings: Secondary | ICD-10-CM | POA: Diagnosis not present

## 2014-10-13 ENCOUNTER — Encounter: Payer: Self-pay | Admitting: Family

## 2014-10-18 ENCOUNTER — Ambulatory Visit: Payer: Medicare PPO | Admitting: Family

## 2014-10-25 ENCOUNTER — Encounter: Payer: Self-pay | Admitting: Family

## 2014-10-27 ENCOUNTER — Ambulatory Visit: Payer: Medicare PPO | Admitting: Family

## 2014-11-08 ENCOUNTER — Encounter: Payer: Self-pay | Admitting: Family

## 2014-11-09 ENCOUNTER — Encounter: Payer: Self-pay | Admitting: Family

## 2014-11-09 ENCOUNTER — Other Ambulatory Visit: Payer: Self-pay | Admitting: Vascular Surgery

## 2014-11-09 ENCOUNTER — Ambulatory Visit (INDEPENDENT_AMBULATORY_CARE_PROVIDER_SITE_OTHER): Payer: Commercial Managed Care - HMO | Admitting: Family

## 2014-11-09 VITALS — BP 132/85 | HR 73 | Temp 97.8°F | Ht 73.0 in | Wt 176.3 lb

## 2014-11-09 DIAGNOSIS — Z87891 Personal history of nicotine dependence: Secondary | ICD-10-CM | POA: Diagnosis not present

## 2014-11-09 DIAGNOSIS — I739 Peripheral vascular disease, unspecified: Secondary | ICD-10-CM | POA: Diagnosis not present

## 2014-11-09 DIAGNOSIS — I723 Aneurysm of iliac artery: Secondary | ICD-10-CM

## 2014-11-09 DIAGNOSIS — R0989 Other specified symptoms and signs involving the circulatory and respiratory systems: Secondary | ICD-10-CM | POA: Diagnosis not present

## 2014-11-09 NOTE — Progress Notes (Signed)
[facility] [place]  Established Iliac Artery Aneurysms  History of Present Illness  Sean Leonard is a 69 y.o. ([birth year]) male patient of Dr. Donnetta Hutching who returns for a medical evaluation prior to his scheduled CT follow up of bilateral internal iliac artery aneurysms. This was an incidental finding from a CT scan of his abdomen and pelvis for evaluation of abdominal discomfort. He also had finding of enlarged prostate and is having evaluation of this as well. Fortunately the abdominal discomfort that the indication for the CT scan has resolved.  He has no history of aneurysms and no family history of aneurysm.  He last saw Dr. Donnetta Hutching on 11/26/12. At that time CT scan from July 2014 was reviewed by Dr. Donnetta Hutching who reviewed the report and also the actual films. The pt did have a CT scan in 2006 and these were reviewed as well. He did have mild ectasia of the internal iliac arteries then. He does have small bilateral internal iliac artery aneurysms. There is some stenosis in his right internal iliac artery of no significance.   Dr. Donnetta Hutching had a long discussion with the patient and his wife present at the September 2014 visit and explained that there is minimal if any risk of rupture of his aneurysm at the current small size. Dr. Donnetta Hutching explained that this could enlarge over time and recommended a CT scan in 2 years to rule out any progression and enlargement. He was to continue to followup with Dr. Berdine Addison regarding his right leg discomfort. He reports occasional right hip to right calf pain lying in bed that is relieved by changing position. He also reports the same type of pain when walking, and calf pain is not relieved until he sits for a few minutes.  Pt denies any known history of stroke or TIA.  Pt states he cannot take ASA, "I had 15 ulcers on my stomach".   Pt denies shortness of breath or cough.  Pt Diabetic: No Pt smoker: former smoker, quit in 2008   Past Medical  History  Diagnosis Date  . Peptic ulcer   . Thyroid disease   . Diverticula of colon   . DDD (degenerative disc disease), lumbar   . Prostatitis   . Iliac artery aneurysm, bilateral     with significant stenosis of right internal iliac artery   Past Surgical History  Procedure Laterality Date  . Thyroidectomy    . Tonsillectomy    . Colonoscopy  April 2005    Dr. Irving Shows. Small internal hemorrhoids. Multiple small scattered diverticula in the cecum and descending colon.  . Esophagogastroduodenoscopy  July 2005    Dr. Irving Shows grade 2 esophagitis at the GE junction. Acute gastritis. Multiple acute active, deep, irregular, friable ulcers in the antrum and body of the stomach. 2 active, deep, friable, irregular ulcers in the duodenum. Pathology not available.  . Colonoscopy with esophagogastroduodenoscopy (egd) N/A 12/16/2012    Dr. Gala Romney: rectal and colonic polyps with inadequate preparation, tubular adenoma. EGD with biopsy proven short segment Barrett's esophagus, hiatal hernia, mild chronic inactive gastritis   . Colonoscopy N/A 01/18/2014    Procedure: COLONOSCOPY;  Surgeon: Daneil Dolin, MD;  Location: [facility];  Service: Endoscopy;  Laterality: N/A;  1100  . Esophagogastroduodenoscopy N/A 01/18/2014    Procedure: ESOPHAGOGASTRODUODENOSCOPY (EGD);  Surgeon: Daneil Dolin, MD;  Location: [facility];  Service: Endoscopy;  Laterality: N/A;   Social History Social History   Social History  .  Marital Status: Married    Spouse Name: N/A  . Number of Children: N/A  . Years of Education: N/A   Occupational History  . Not on file.   Social History Main Topics  . Smoking status: Former Smoker -- 25 years    Types: Cigars    Quit date: 11/27/2006  . Smokeless tobacco: Never Used     Comment: Quit in 2009  . Alcohol Use: No     Comment: former. weekend drinker (liqour) before, May 2015 last time he drank during a Therapist, nutritional.   . Drug Use: No  . Sexual Activity:  Not on file   Other Topics Concern  . Not on file   Social History Narrative   1 deceased child. 1 living   Family History Family History  Problem Relation Age of Onset  . Colon cancer Neg Hx   . Liver disease Neg Hx   . Hypertension Mother   . Heart disease Mother   . Heart disease Father   . Hyperlipidemia Sister   . Cancer Sister     Current Outpatient Prescriptions on File Prior to Visit  Medication Sig Dispense Refill  . fish oil-omega-3 fatty acids 1000 MG capsule Take 2 g by mouth daily.    Marland Kitchen HYDROcodone-acetaminophen (NORCO/VICODIN) 5-325 MG per tablet Take 1 tablet by mouth every 6 (six) hours as needed for pain.    Marland Kitchen levothyroxine (SYNTHROID, LEVOTHROID) 112 MCG tablet Take 112 mcg by mouth every morning.    . Multiple Vitamin (MULTIVITAMIN WITH MINERALS) TABS tablet Take 1 tablet by mouth daily.    Marland Kitchen omeprazole (PRILOSEC) 20 MG capsule Take 1 capsule (20 mg total) by mouth daily. 30 capsule 5  . polyethylene glycol-electrolytes (TRILYTE) 420 G solution Take 4,000 mLs by mouth as directed. 4000 mL 0  . pravastatin (PRAVACHOL) 20 MG tablet Take 20 mg by mouth at bedtime.     No current facility-administered medications on file prior to visit.   No Known Allergies  ROS: See HPI for pertinent positives and negatives.  Physical Examination  Filed Vitals:   11/09/14 0915  BP: 132/85  Pulse: 73  Temp: 97.8 F (36.6 C)  TempSrc: Oral  Height: 6\' 1"  (1.854 m)  Weight: 176 lb 4.8 oz (79.969 kg)  SpO2: 96%   Body mass index is 23.26 kg/(m^2).  General: A&O x 3, WD.  Pulmonary: Sym exp, diminished air movement in right posterior base with a few crackles, no rhonchi or wheezing.  Cardiac: RRR, Nl S1, S2, no detected murmur.   Carotid Bruits Right Left   Negative Negative   Aorta is not palpable Radial pulses are 2+ palpable and =                          VASCULAR EXAM:                                                                                                          LE Pulses Right Left  FEMORAL  1+ palpable  2+ palpable        POPLITEAL  1+ palpable   1+ palpable       POSTERIOR TIBIAL  not palpable   1+ palpable        DORSALIS PEDIS      ANTERIOR TIBIAL not palpable  not palpable      Gastrointestinal: soft, NTND, -G/R, - HSM, - masses palpated, - CVAT B.  Musculoskeletal: M/S 5/5 throughout, Extremities without ischemic changes.  Neurologic: CN 2-12 intact, Pain and light touch intact in extremities are intact, Motor exam as listed above.    Medical Decision Making  The patient is a 69 y.o. male who presents with asymptomatic known bilateral internal iliac artery aneurysms.  Pt is already scheduled for CTA follow up of iliac artery aneurysm on 11/23/14, he requests this be performed in [place], then follow up with Dr. Donnetta Hutching is already scheduled for 11/29/14.  He has symptoms in his right buttock to calf that are consistent with radiculopathy, and he also has right LE symptoms that are consistent with intermittent claudication. He reports sudden episodes of occasional left low back pain that is relieved by position change. Will schedule ABI's on his return 11/29/14 before he sees Dr. Donnetta Hutching. Pt advised to call his PCP's office today re crackles and diminished air movement in right posterior lung fields; however, he is asymptomatic re this.   Face to face time with patient was 25 minutes. Over 50% of this time was spent on counseling and coordination of care.   I emphasized the importance of maximal medical management including strict control of blood pressure, blood glucose, and lipid levels, antiplatelet agents, obtaining regular exercise, and continued cessation of smoking.   The patient was given information about AAA including signs, symptoms, treatment, and how to minimize the risk of enlargement and rupture of aneurysms.    The patient was advised to call 911 should the patient experience sudden onset abdominal  or back pain.   Thank you for allowing [place] to participate in this patient's care.  Clemon Chambers, RN, MSN, FNP-C [facility] Office: [phone]  Clinic Physician: Early  11/09/2014, 9:15 AM

## 2014-11-11 DIAGNOSIS — I2510 Atherosclerotic heart disease of native coronary artery without angina pectoris: Secondary | ICD-10-CM | POA: Diagnosis not present

## 2014-11-11 DIAGNOSIS — I739 Peripheral vascular disease, unspecified: Secondary | ICD-10-CM | POA: Diagnosis not present

## 2014-11-11 DIAGNOSIS — M1990 Unspecified osteoarthritis, unspecified site: Secondary | ICD-10-CM | POA: Diagnosis not present

## 2014-11-11 DIAGNOSIS — E039 Hypothyroidism, unspecified: Secondary | ICD-10-CM | POA: Diagnosis not present

## 2014-11-12 ENCOUNTER — Other Ambulatory Visit: Payer: Self-pay | Admitting: *Deleted

## 2014-11-12 DIAGNOSIS — M79606 Pain in leg, unspecified: Secondary | ICD-10-CM

## 2014-11-15 ENCOUNTER — Ambulatory Visit (HOSPITAL_COMMUNITY)
Admission: RE | Admit: 2014-11-15 | Discharge: 2014-11-15 | Disposition: A | Discharge status: Home / Self Care | Payer: Commercial Managed Care - HMO | Source: Ambulatory Visit | Attending: Family Medicine | Admitting: Family Medicine

## 2014-11-15 ENCOUNTER — Other Ambulatory Visit (HOSPITAL_COMMUNITY): Payer: Self-pay | Admitting: Family Medicine

## 2014-11-15 DIAGNOSIS — Z87891 Personal history of nicotine dependence: Secondary | ICD-10-CM | POA: Diagnosis not present

## 2014-11-15 DIAGNOSIS — J449 Chronic obstructive pulmonary disease, unspecified: Secondary | ICD-10-CM | POA: Diagnosis not present

## 2014-11-22 ENCOUNTER — Ambulatory Visit (HOSPITAL_COMMUNITY)
Admission: RE | Admit: 2014-11-22 | Discharge: 2014-11-22 | Disposition: A | Discharge status: Home / Self Care | Payer: Commercial Managed Care - HMO | Source: Ambulatory Visit | Attending: Family | Admitting: Family

## 2014-11-22 ENCOUNTER — Ambulatory Visit (HOSPITAL_COMMUNITY)
Admission: RE | Admit: 2014-11-22 | Discharge: 2014-11-22 | Disposition: A | Discharge status: Home / Self Care | Payer: Commercial Managed Care - HMO | Source: Ambulatory Visit | Attending: Vascular Surgery | Admitting: Vascular Surgery

## 2014-11-22 ENCOUNTER — Ambulatory Visit (HOSPITAL_COMMUNITY): Admission: RE | Admit: 2014-11-22 | Payer: Commercial Managed Care - HMO | Source: Ambulatory Visit

## 2014-11-22 DIAGNOSIS — I723 Aneurysm of iliac artery: Secondary | ICD-10-CM | POA: Diagnosis not present

## 2014-11-22 DIAGNOSIS — Z87891 Personal history of nicotine dependence: Secondary | ICD-10-CM | POA: Insufficient documentation

## 2014-11-22 DIAGNOSIS — I739 Peripheral vascular disease, unspecified: Secondary | ICD-10-CM

## 2014-11-22 DIAGNOSIS — R0989 Other specified symptoms and signs involving the circulatory and respiratory systems: Secondary | ICD-10-CM

## 2014-11-22 LAB — POCT I-STAT CREATININE: CREATININE: 1 mg/dL (ref 0.61–1.24)

## 2014-11-22 MED ORDER — IOHEXOL 350 MG/ML SOLN
100.0000 mL | Freq: Once | INTRAVENOUS | Status: AC | PRN
  Administered 2014-11-22: 100 mL via INTRAVENOUS

## 2014-11-23 ENCOUNTER — Other Ambulatory Visit: Payer: Medicare PPO

## 2014-11-25 ENCOUNTER — Encounter: Payer: Self-pay | Admitting: Vascular Surgery

## 2014-11-29 ENCOUNTER — Ambulatory Visit: Payer: Medicare Other | Admitting: Vascular Surgery

## 2014-11-29 ENCOUNTER — Encounter (HOSPITAL_COMMUNITY): Payer: Commercial Managed Care - HMO

## 2014-11-29 ENCOUNTER — Ambulatory Visit: Payer: Self-pay | Admitting: Vascular Surgery

## 2014-12-19 ENCOUNTER — Encounter: Payer: Self-pay | Admitting: Vascular Surgery

## 2014-12-20 ENCOUNTER — Ambulatory Visit: Payer: Medicare HMO | Admitting: Vascular Surgery

## 2014-12-20 ENCOUNTER — Encounter (HOSPITAL_COMMUNITY): Payer: Medicare HMO

## 2015-01-25 ENCOUNTER — Other Ambulatory Visit: Payer: Self-pay | Admitting: Gastroenterology

## 2015-02-01 DIAGNOSIS — Z6823 Body mass index (BMI) 23.0-23.9, adult: Secondary | ICD-10-CM | POA: Diagnosis not present

## 2015-02-01 DIAGNOSIS — M79642 Pain in left hand: Secondary | ICD-10-CM | POA: Diagnosis not present

## 2015-02-02 ENCOUNTER — Encounter: Payer: Self-pay | Admitting: Vascular Surgery

## 2015-02-07 ENCOUNTER — Encounter: Payer: Self-pay | Admitting: Vascular Surgery

## 2015-02-07 ENCOUNTER — Ambulatory Visit (HOSPITAL_COMMUNITY)
Admission: RE | Admit: 2015-02-07 | Discharge: 2015-02-07 | Disposition: A | Discharge status: Home / Self Care | Payer: Commercial Managed Care - HMO | Source: Ambulatory Visit | Attending: Vascular Surgery | Admitting: Vascular Surgery

## 2015-02-07 ENCOUNTER — Ambulatory Visit (INDEPENDENT_AMBULATORY_CARE_PROVIDER_SITE_OTHER): Payer: Commercial Managed Care - HMO | Admitting: Vascular Surgery

## 2015-02-07 VITALS — BP 170/99 | HR 64 | Ht 73.0 in | Wt 180.0 lb

## 2015-02-07 DIAGNOSIS — I723 Aneurysm of iliac artery: Secondary | ICD-10-CM

## 2015-02-07 DIAGNOSIS — M79606 Pain in leg, unspecified: Secondary | ICD-10-CM

## 2015-02-07 NOTE — Progress Notes (Signed)
[facility] [place]  Patient name: Sean Leonard MRN: [id number] DOB: 1945/09/18 Sex: male  REASON FOR VISIT: Aortic and iliac aneurysms  HPI: Sean Leonard is a 69 y.o. male seen today for follow-up of diffuse aortic and iliac aneurysmal disease. He had a CT scan in August and is here for discussion of this. He also reports pain in his right buttock extending down to the back portion of his thigh and into his calf. He does have some degenerative disc disease type symptoms and this may be the cause of this. He underwent noninvasive studies in our office today for further evaluation he has no symptoms referable to his aneurysms  Past Medical History  Diagnosis Date  . Peptic ulcer   . Thyroid disease   . Diverticula of colon   . DDD (degenerative disc disease), lumbar   . Prostatitis   . Iliac artery aneurysm, bilateral ([facility])     with significant stenosis of right internal iliac artery    Family History  Problem Relation Age of Onset  . Colon cancer Neg Hx   . Liver disease Neg Hx   . Hypertension Mother   . Heart disease Mother     before age 84  . Heart disease Father     before age 51  . Hyperlipidemia Sister   . Cancer Sister     SOCIAL HISTORY: Social History  Substance Use Topics  . Smoking status: Former Smoker -- 25 years    Types: Cigars    Quit date: 11/27/2006  . Smokeless tobacco: Never Used     Comment: Quit in 2009  . Alcohol Use: No     Comment: former. weekend drinker (liqour) before, May 2015 last time he drank during a Therapist, nutritional.     No Known Allergies  Current Outpatient Prescriptions  Medication Sig Dispense Refill  . fish oil-omega-3 fatty acids 1000 MG capsule Take 2 g by mouth daily.    Marland Kitchen levothyroxine (SYNTHROID, LEVOTHROID) 112 MCG tablet Take 112 mcg by mouth every morning.    . Multiple Vitamin (MULTIVITAMIN WITH MINERALS) TABS tablet Take 1 tablet by mouth daily.    Marland Kitchen omeprazole (PRILOSEC) 20 MG capsule  TAKE 1 CAPSULE EVERY DAY 90 capsule 3  . pravastatin (PRAVACHOL) 20 MG tablet Take 20 mg by mouth at bedtime.    Marland Kitchen HYDROcodone-acetaminophen (NORCO/VICODIN) 5-325 MG per tablet Take 1 tablet by mouth every 6 (six) hours as needed for pain.    . polyethylene glycol-electrolytes (TRILYTE) 420 G solution Take 4,000 mLs by mouth as directed. (Patient not taking: Reported on 11/09/2014) 4000 mL 0   No current facility-administered medications for this visit.    REVIEW OF SYSTEMS:  [X]  denotes positive finding, [ ]  denotes negative finding Cardiac  Comments:  Chest pain or chest pressure:    Shortness of breath upon exertion:    Short of breath when lying flat:    Irregular heart rhythm:        Vascular    Pain in calf, thigh, or hip brought on by ambulation: x   Pain in feet at night that wakes you up from your sleep:     Blood clot in your veins:    Leg swelling:         Pulmonary    Oxygen at home:    Productive cough:     Wheezing:         Neurologic    Sudden weakness in  arms or legs:  x   Sudden numbness in arms or legs:     Sudden onset of difficulty speaking or slurred speech:    Temporary loss of vision in one eye:     Problems with dizziness:         Gastrointestinal    Blood in stool:     Vomited blood:         Genitourinary    Burning when urinating:     Blood in urine:        Psychiatric    Major depression:         Hematologic    Bleeding problems:    Problems with blood clotting too easily:        Skin    Rashes or ulcers:        Constitutional    Fever or chills: x     PHYSICAL EXAM: Filed Vitals:   02/07/15 0908 02/07/15 0910  BP: 179/91 170/99  Pulse: 64   Height: 6\' 1"  (1.854 m)   Weight: 180 lb (81.647 kg)   SpO2: 99%     GENERAL: The patient is a well-nourished male, in no acute distress. The vital signs are documented above. CARDIAC: There is a regular rate and rhythm.  VASCULAR: Carotid arteries without bruits bilaterally. 2+ radial  2+ femoral and 2+ popliteal pulses bilaterally. He does have 1-2+ dorsalis pedis pulses bilaterally PULMONARY: There is good air exchange bilaterally without wheezing or rales. ABDOMEN: Soft and non-tender with normal pitched bowel sounds. I do not palpate aneurysms MUSCULOSKELETAL: There are no major deformities or cyanosis. NEUROLOGIC: No focal weakness or paresthesias are detected. SKIN: There are no ulcers or rashes noted. PSYCHIATRIC: The patient has a normal affect.  DATA:  Lower extremity arterial duplex today reveals biphasic posterior tibial pulses bilaterally. Ankle arm is normal at 1.0 on the left and slightly diminished at 0.9 on the right.  CT scan from August was reviewed. This was compared to study from 2 years ago. This does show slight several millimeter increase in size of his left internal iliac and bilateral common iliac artery aneurysms. These are all in the 2 cm range.     MEDICAL ISSUES: I discussed this at length with patient. This does reveal white increase in size of his aneurysms. Explained that none of these are at the point where we would recommend any consideration of intervention. He is extremely low risk of rupture from the small aneurysms. Recommend repeat CT scan in 2 years. I do not feel his right leg symptoms are related to arterial insufficiency and suspect that these are related to radicular lumbar symptoms. He will discuss this with his primary care physician if it persists. We'll see him again in 2 years for  No Follow-up on file.   Curt Jews [facility] [place]: [phone]

## 2015-02-07 NOTE — Addendum Note (Signed)
Addended by: Dorthula Rue L on: 02/07/2015 04:05 PM   Modules accepted: Orders

## 2015-05-23 DIAGNOSIS — E782 Mixed hyperlipidemia: Secondary | ICD-10-CM | POA: Diagnosis not present

## 2015-05-23 DIAGNOSIS — M25522 Pain in left elbow: Secondary | ICD-10-CM | POA: Diagnosis not present

## 2015-05-23 DIAGNOSIS — E039 Hypothyroidism, unspecified: Secondary | ICD-10-CM | POA: Diagnosis not present

## 2015-05-23 DIAGNOSIS — K219 Gastro-esophageal reflux disease without esophagitis: Secondary | ICD-10-CM | POA: Diagnosis not present

## 2015-06-06 DIAGNOSIS — M7712 Lateral epicondylitis, left elbow: Secondary | ICD-10-CM | POA: Diagnosis not present

## 2015-06-06 DIAGNOSIS — M25522 Pain in left elbow: Secondary | ICD-10-CM | POA: Diagnosis not present

## 2015-06-06 DIAGNOSIS — M62542 Muscle wasting and atrophy, not elsewhere classified, left hand: Secondary | ICD-10-CM | POA: Diagnosis not present

## 2015-08-23 DIAGNOSIS — M25522 Pain in left elbow: Secondary | ICD-10-CM | POA: Diagnosis not present

## 2015-08-23 DIAGNOSIS — K219 Gastro-esophageal reflux disease without esophagitis: Secondary | ICD-10-CM | POA: Diagnosis not present

## 2015-08-23 DIAGNOSIS — E039 Hypothyroidism, unspecified: Secondary | ICD-10-CM | POA: Diagnosis not present

## 2015-08-23 DIAGNOSIS — Z1389 Encounter for screening for other disorder: Secondary | ICD-10-CM | POA: Diagnosis not present

## 2015-08-23 DIAGNOSIS — E782 Mixed hyperlipidemia: Secondary | ICD-10-CM | POA: Diagnosis not present

## 2015-09-13 DIAGNOSIS — R5383 Other fatigue: Secondary | ICD-10-CM | POA: Diagnosis not present

## 2015-09-13 DIAGNOSIS — R634 Abnormal weight loss: Secondary | ICD-10-CM | POA: Diagnosis not present

## 2015-09-13 DIAGNOSIS — E039 Hypothyroidism, unspecified: Secondary | ICD-10-CM | POA: Diagnosis not present

## 2015-09-13 DIAGNOSIS — M25522 Pain in left elbow: Secondary | ICD-10-CM | POA: Diagnosis not present

## 2015-10-24 ENCOUNTER — Ambulatory Visit: Payer: Medicare PPO | Admitting: Family

## 2015-12-25 ENCOUNTER — Other Ambulatory Visit: Payer: Self-pay | Admitting: Gastroenterology

## 2015-12-27 NOTE — Telephone Encounter (Signed)
Patient last seen in 12/2013. Needs follow up ov for refills. Refill x 1 provided.

## 2016-06-06 DIAGNOSIS — Z0100 Encounter for examination of eyes and vision without abnormal findings: Secondary | ICD-10-CM | POA: Diagnosis not present

## 2016-06-11 DIAGNOSIS — K219 Gastro-esophageal reflux disease without esophagitis: Secondary | ICD-10-CM | POA: Diagnosis not present

## 2016-06-11 DIAGNOSIS — E782 Mixed hyperlipidemia: Secondary | ICD-10-CM | POA: Diagnosis not present

## 2016-06-11 DIAGNOSIS — Z6823 Body mass index (BMI) 23.0-23.9, adult: Secondary | ICD-10-CM | POA: Diagnosis not present

## 2016-06-11 DIAGNOSIS — Z0000 Encounter for general adult medical examination without abnormal findings: Secondary | ICD-10-CM | POA: Diagnosis not present

## 2016-06-11 DIAGNOSIS — E039 Hypothyroidism, unspecified: Secondary | ICD-10-CM | POA: Diagnosis not present

## 2016-06-22 ENCOUNTER — Emergency Department (HOSPITAL_COMMUNITY): Payer: Medicare HMO

## 2016-06-22 ENCOUNTER — Encounter (HOSPITAL_COMMUNITY): Payer: Self-pay | Admitting: *Deleted

## 2016-06-22 ENCOUNTER — Inpatient Hospital Stay (HOSPITAL_COMMUNITY)
Admission: EM | Admit: 2016-06-22 | Discharge: 2016-06-30 | DRG: 377 | Disposition: A | Discharge status: Home / Self Care | Payer: Medicare HMO | Attending: Internal Medicine | Admitting: Internal Medicine

## 2016-06-22 DIAGNOSIS — Z8711 Personal history of peptic ulcer disease: Secondary | ICD-10-CM

## 2016-06-22 DIAGNOSIS — I723 Aneurysm of iliac artery: Secondary | ICD-10-CM | POA: Diagnosis present

## 2016-06-22 DIAGNOSIS — I959 Hypotension, unspecified: Secondary | ICD-10-CM

## 2016-06-22 DIAGNOSIS — R1031 Right lower quadrant pain: Secondary | ICD-10-CM | POA: Diagnosis not present

## 2016-06-22 DIAGNOSIS — R578 Other shock: Secondary | ICD-10-CM | POA: Diagnosis not present

## 2016-06-22 DIAGNOSIS — Z79899 Other long term (current) drug therapy: Secondary | ICD-10-CM | POA: Diagnosis not present

## 2016-06-22 DIAGNOSIS — K648 Other hemorrhoids: Secondary | ICD-10-CM | POA: Diagnosis present

## 2016-06-22 DIAGNOSIS — F419 Anxiety disorder, unspecified: Secondary | ICD-10-CM | POA: Diagnosis present

## 2016-06-22 DIAGNOSIS — R109 Unspecified abdominal pain: Secondary | ICD-10-CM | POA: Diagnosis not present

## 2016-06-22 DIAGNOSIS — D72829 Elevated white blood cell count, unspecified: Secondary | ICD-10-CM | POA: Diagnosis not present

## 2016-06-22 DIAGNOSIS — D120 Benign neoplasm of cecum: Secondary | ICD-10-CM | POA: Diagnosis present

## 2016-06-22 DIAGNOSIS — K644 Residual hemorrhoidal skin tags: Secondary | ICD-10-CM | POA: Diagnosis present

## 2016-06-22 DIAGNOSIS — Z87891 Personal history of nicotine dependence: Secondary | ICD-10-CM | POA: Diagnosis not present

## 2016-06-22 DIAGNOSIS — Q438 Other specified congenital malformations of intestine: Secondary | ICD-10-CM | POA: Diagnosis not present

## 2016-06-22 DIAGNOSIS — E039 Hypothyroidism, unspecified: Secondary | ICD-10-CM | POA: Diagnosis present

## 2016-06-22 DIAGNOSIS — K5731 Diverticulosis of large intestine without perforation or abscess with bleeding: Principal | ICD-10-CM | POA: Diagnosis present

## 2016-06-22 DIAGNOSIS — E876 Hypokalemia: Secondary | ICD-10-CM | POA: Diagnosis present

## 2016-06-22 DIAGNOSIS — K279 Peptic ulcer, site unspecified, unspecified as acute or chronic, without hemorrhage or perforation: Secondary | ICD-10-CM | POA: Diagnosis not present

## 2016-06-22 DIAGNOSIS — K921 Melena: Secondary | ICD-10-CM | POA: Diagnosis not present

## 2016-06-22 DIAGNOSIS — K633 Ulcer of intestine: Secondary | ICD-10-CM | POA: Diagnosis present

## 2016-06-22 DIAGNOSIS — K635 Polyp of colon: Secondary | ICD-10-CM | POA: Diagnosis not present

## 2016-06-22 DIAGNOSIS — Y848 Other medical procedures as the cause of abnormal reaction of the patient, or of later complication, without mention of misadventure at the time of the procedure: Secondary | ICD-10-CM | POA: Diagnosis not present

## 2016-06-22 DIAGNOSIS — K625 Hemorrhage of anus and rectum: Secondary | ICD-10-CM | POA: Diagnosis present

## 2016-06-22 DIAGNOSIS — K5793 Diverticulitis of intestine, part unspecified, without perforation or abscess with bleeding: Secondary | ICD-10-CM

## 2016-06-22 DIAGNOSIS — I517 Cardiomegaly: Secondary | ICD-10-CM | POA: Diagnosis not present

## 2016-06-22 DIAGNOSIS — I639 Cerebral infarction, unspecified: Secondary | ICD-10-CM

## 2016-06-22 DIAGNOSIS — E872 Acidosis: Secondary | ICD-10-CM | POA: Diagnosis present

## 2016-06-22 DIAGNOSIS — D696 Thrombocytopenia, unspecified: Secondary | ICD-10-CM | POA: Diagnosis not present

## 2016-06-22 DIAGNOSIS — K2270 Barrett's esophagus without dysplasia: Secondary | ICD-10-CM | POA: Diagnosis present

## 2016-06-22 DIAGNOSIS — R1032 Left lower quadrant pain: Secondary | ICD-10-CM | POA: Diagnosis not present

## 2016-06-22 DIAGNOSIS — D649 Anemia, unspecified: Secondary | ICD-10-CM | POA: Diagnosis not present

## 2016-06-22 DIAGNOSIS — K922 Gastrointestinal hemorrhage, unspecified: Secondary | ICD-10-CM | POA: Diagnosis not present

## 2016-06-22 DIAGNOSIS — D62 Acute posthemorrhagic anemia: Secondary | ICD-10-CM | POA: Diagnosis present

## 2016-06-22 DIAGNOSIS — R1030 Lower abdominal pain, unspecified: Secondary | ICD-10-CM | POA: Diagnosis not present

## 2016-06-22 DIAGNOSIS — K91840 Postprocedural hemorrhage and hematoma of a digestive system organ or structure following a digestive system procedure: Secondary | ICD-10-CM | POA: Diagnosis not present

## 2016-06-22 DIAGNOSIS — R200 Anesthesia of skin: Secondary | ICD-10-CM | POA: Diagnosis not present

## 2016-06-22 LAB — I-STAT CG4 LACTIC ACID, ED: LACTIC ACID, VENOUS: 2.76 mmol/L — AB (ref 0.5–1.9)

## 2016-06-22 LAB — I-STAT CHEM 8, ED
BUN: 24 mg/dL — AB (ref 6–20)
CHLORIDE: 107 mmol/L (ref 101–111)
Calcium, Ion: 0.91 mmol/L — ABNORMAL LOW (ref 1.15–1.40)
Creatinine, Ser: 1.2 mg/dL (ref 0.61–1.24)
GLUCOSE: 146 mg/dL — AB (ref 65–99)
HCT: 36 % — ABNORMAL LOW (ref 39.0–52.0)
Hemoglobin: 12.2 g/dL — ABNORMAL LOW (ref 13.0–17.0)
POTASSIUM: 5.6 mmol/L — AB (ref 3.5–5.1)
SODIUM: 137 mmol/L (ref 135–145)
TCO2: 23 mmol/L (ref 0–100)

## 2016-06-22 LAB — COMPREHENSIVE METABOLIC PANEL
ALT: 16 U/L — AB (ref 17–63)
AST: 18 U/L (ref 15–41)
Albumin: 3.6 g/dL (ref 3.5–5.0)
Alkaline Phosphatase: 50 U/L (ref 38–126)
Anion gap: 9 (ref 5–15)
BUN: 18 mg/dL (ref 6–20)
CO2: 24 mmol/L (ref 22–32)
CREATININE: 1.27 mg/dL — AB (ref 0.61–1.24)
Calcium: 8.7 mg/dL — ABNORMAL LOW (ref 8.9–10.3)
Chloride: 102 mmol/L (ref 101–111)
GFR calc Af Amer: >60 mL/min (ref >60)
GFR, EST NON AFRICAN AMERICAN: 56 mL/min — AB (ref >60)
Glucose, Bld: 167 mg/dL — ABNORMAL HIGH (ref 65–99)
Potassium: 3 mmol/L — ABNORMAL LOW (ref 3.5–5.1)
Sodium: 135 mmol/L (ref 135–145)
Total Bilirubin: 0.5 mg/dL (ref 0.3–1.2)
Total Protein: 6.5 g/dL (ref 6.5–8.1)

## 2016-06-22 LAB — CBC WITH DIFFERENTIAL/PLATELET
BASOS PCT: 0 %
Basophils Absolute: 0 10*3/uL (ref 0.0–0.1)
Eosinophils Absolute: 0.2 10*3/uL (ref 0.0–0.7)
Eosinophils Relative: 2 %
HCT: 35.7 % — ABNORMAL LOW (ref 39.0–52.0)
HEMOGLOBIN: 11.7 g/dL — AB (ref 13.0–17.0)
LYMPHS PCT: 59 %
Lymphs Abs: 5.9 10*3/uL — ABNORMAL HIGH (ref 0.7–4.0)
MCH: 29.8 pg (ref 26.0–34.0)
MCHC: 32.8 g/dL (ref 30.0–36.0)
MCV: 90.8 fL (ref 78.0–100.0)
MONOS PCT: 9 %
Monocytes Absolute: 0.9 10*3/uL (ref 0.1–1.0)
NEUTROS ABS: 3 10*3/uL (ref 1.7–7.7)
Neutrophils Relative %: 30 %
Platelets: 243 10*3/uL (ref 150–400)
RBC: 3.93 MIL/uL — ABNORMAL LOW (ref 4.22–5.81)
RDW: 14.3 % (ref 11.5–15.5)
WBC: 10 10*3/uL (ref 4.0–10.5)

## 2016-06-22 LAB — TROPONIN I

## 2016-06-22 LAB — PROTIME-INR
INR: 0.97
Prothrombin Time: 12.8 seconds (ref 11.4–15.2)

## 2016-06-22 LAB — LIPASE, BLOOD: LIPASE: 33 U/L (ref 11–51)

## 2016-06-22 LAB — I-STAT TROPONIN, ED: TROPONIN I, POC: 0 ng/mL (ref 0.00–0.08)

## 2016-06-22 LAB — POC OCCULT BLOOD, ED: FECAL OCCULT BLD: POSITIVE — AB

## 2016-06-22 MED ORDER — SODIUM CHLORIDE 0.9 % IV SOLN: INTRAVENOUS | Status: DC

## 2016-06-22 MED ORDER — SODIUM CHLORIDE 0.9 % IV BOLUS (SEPSIS)
1000.0000 mL | Freq: Once | INTRAVENOUS | Status: AC
  Administered 2016-06-22: 1000 mL via INTRAVENOUS

## 2016-06-22 MED ORDER — IOPAMIDOL (ISOVUE-300) INJECTION 61%
100.0000 mL | Freq: Once | INTRAVENOUS | Status: AC | PRN
  Administered 2016-06-22: 100 mL via INTRAVENOUS

## 2016-06-22 MED ORDER — SODIUM CHLORIDE 0.9 % IV BOLUS (SEPSIS): 1000.0000 mL | Freq: Once | INTRAVENOUS | Status: DC

## 2016-06-22 NOTE — ED Notes (Signed)
ED Provider at bedside. 

## 2016-06-22 NOTE — ED Notes (Signed)
Dr. Thurnell Garbe made aware of Sean Leonard and his symptoms.

## 2016-06-22 NOTE — ED Triage Notes (Signed)
Pt c/o severe abdominal pain and bright red rectal bleeding since this morning. Pt diaphoretic, feeling faint and is in severe pain.

## 2016-06-22 NOTE — ED Provider Notes (Signed)
[facility] DEPT Provider Note   CSN: [id number] Arrival date & time: 06/22/16  2032     History   Chief Complaint Chief Complaint  Patient presents with  . Rectal Bleeding    this morning  . Abdominal Pain    1 week    HPI Sean Leonard is a 71 y.o. male.  HPI  Pt was seen at 2050.  Per pt, c/o gradual onset and persistence of constant generalized abd "pain" for the past 1 week, worse since this morning.  Has been associated with blood in stools today.  Describes the abd pain as "cramping."  Pt was evaluated by his PMD 1 week ago for his abd pain and "had some blood work done." Pt cannot recall the dx. Denies diarrhea, no fevers, no back pain, no rash, no CP/SOB, no black stools, no N/V.       Past Medical History:  Diagnosis Date  . DDD (degenerative disc disease), lumbar   . Diverticula of colon   . Iliac artery aneurysm, bilateral (HCC)    with significant stenosis of right internal iliac artery  . Peptic ulcer   . Prostatitis   . Thyroid disease     Patient Active Problem List   Diagnosis Date Noted  . Barrett's esophagus 12/29/2013  . Adenomatous colon polyp 12/29/2013  . Iliac artery aneurysm ([place]) 11/26/2012  . Loss of weight 11/19/2012  . History of peptic ulcer disease 11/19/2012  . Abdominal pain, left upper quadrant 11/19/2012  . Abdominal pain, epigastric 11/19/2012  . Elevated PSA, between 10 and less than 20 ng/ml 11/19/2012  . 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 Aneurysm of internal iliac artery ([place]) 11/19/2012    Past Surgical History:  Procedure Laterality Date  . COLONOSCOPY  April 2005   Dr. Irving Shows. Small internal hemorrhoids. Multiple small scattered diverticula in the cecum and descending colon.  . COLONOSCOPY N/A 01/18/2014   Procedure: COLONOSCOPY;  Surgeon: Daneil Dolin, MD;  Location: [facility];  Service: Endoscopy;  Laterality: N/A;  1100  . COLONOSCOPY WITH ESOPHAGOGASTRODUODENOSCOPY (EGD) N/A 12/16/2012    Dr. Gala Romney: rectal and colonic polyps with inadequate preparation, tubular adenoma. EGD with biopsy proven short segment Barrett's esophagus, hiatal hernia, mild chronic inactive gastritis   . ESOPHAGOGASTRODUODENOSCOPY  July 2005   Dr. Irving Shows grade 2 esophagitis at the GE junction. Acute gastritis. Multiple acute active, deep, irregular, friable ulcers in the antrum and body of the stomach. 2 active, deep, friable, irregular ulcers in the duodenum. Pathology not available.  . ESOPHAGOGASTRODUODENOSCOPY N/A 01/18/2014   Procedure: ESOPHAGOGASTRODUODENOSCOPY (EGD);  Surgeon: Daneil Dolin, MD;  Location: [facility];  Service: Endoscopy;  Laterality: N/A;  . THYROIDECTOMY    . TONSILLECTOMY         Home Medications    Prior to Admission medications   Medication Sig Start Date End Date Taking? Authorizing Provider  fish oil-omega-3 fatty acids 1000 MG capsule Take 2 g by mouth daily.    Historical Provider, MD  HYDROcodone-acetaminophen (NORCO/VICODIN) 5-325 MG per tablet Take 1 tablet by mouth every 6 (six) hours as needed for pain.    Historical Provider, MD  levothyroxine (SYNTHROID, LEVOTHROID) 112 MCG tablet Take 112 mcg by mouth every morning.    Historical Provider, MD  Multiple Vitamin (MULTIVITAMIN WITH MINERALS) TABS tablet Take 1 tablet by mouth daily.    Historical Provider, MD  omeprazole (PRILOSEC) 20 MG capsule TAKE 1 CAPSULE EVERY DAY 12/27/15  Mahala Menghini, PA-C  polyethylene glycol-electrolytes (TRILYTE) 420 G solution Take 4,000 mLs by mouth as directed. Patient not taking: Reported on 11/09/2014 01/04/14   Daneil Dolin, MD  pravastatin (PRAVACHOL) 20 MG tablet Take 20 mg by mouth at bedtime.    Historical Provider, MD    Family History Family History  Problem Relation Age of Onset  . Hypertension Mother   . Heart disease Mother     before age 3  . Heart disease Father     before age 81  . Hyperlipidemia Sister   . Cancer Sister   . Colon cancer Neg  Hx   . Liver disease Neg Hx     Social History Social History  Substance Use Topics  . Smoking status: Former Smoker    Years: 25.00    Types: Cigars    Quit date: 11/27/2006  . Smokeless tobacco: Never Used     Comment: Quit in 2009  . Alcohol use No     Comment: former. weekend drinker (liqour) before, May 2015 last time he drank during a Therapist, nutritional.      Allergies   Patient has no known allergies.   Review of Systems Review of Systems ROS: Statement: All systems negative except as marked or noted in the HPI; Constitutional: Negative for fever and chills. ; ; Eyes: Negative for eye pain, redness and discharge. ; ; ENMT: Negative for ear pain, hoarseness, nasal congestion, sinus pressure and sore throat. ; ; Cardiovascular: Negative for chest pain, palpitations, diaphoresis, dyspnea and peripheral edema. ; ; Respiratory: Negative for cough, wheezing and stridor. ; ; Gastrointestinal: Negative for nausea, vomiting, diarrhea, hematemesis, jaundice and +abd pain, blood in stools. ; ; Genitourinary: Negative for dysuria, flank pain and hematuria. ; ; Musculoskeletal: Negative for back pain and neck pain. Negative for swelling and trauma.; ; Skin: Negative for pruritus, rash, abrasions, blisters, bruising and skin lesion.; ; Neuro: Negative for headache, lightheadedness and neck stiffness. Negative for weakness, altered level of consciousness, altered mental status, extremity weakness, paresthesias, involuntary movement, seizure and syncope.     Physical Exam Updated Vital Signs BP 113/69   Pulse 73   Temp 97.6 F (36.4 C) (Rectal)   Resp 14   SpO2 97%    Patient Vitals for the past 24 hrs:  BP Temp Temp src Pulse Resp SpO2  06/22/16 2130 113/69 - - 73 14 97 %  06/22/16 2100 (!) 85/65 - - 87 (!) 23 99 %  06/22/16 2057 - 97.6 F (36.4 C) Rectal - - -  06/22/16 2040 (!) 77/55 97.1 F (36.2 C) Tympanic (!) 102 18 100 %     Physical Exam 2055: Physical examination:  Nursing  notes reviewed; Vital signs and O2 SAT reviewed;  Constitutional: Well developed, Well nourished, Well hydrated, Uncomfortable appearing.; Head:  Normocephalic, atraumatic; Eyes: EOMI, PERRL, No scleral icterus; ENMT: Mouth and pharynx normal, Mucous membranes moist; Neck: Supple, Full range of motion, No lymphadenopathy; Cardiovascular: Regular rate and rhythm, No gallop; Respiratory: Breath sounds clear & equal bilaterally, No wheezes.  Speaking full sentences with ease, Normal respiratory effort/excursion; Chest: Nontender, Movement normal; Abdomen: Soft, +RLQ and LLQ tender to palp. Nondistended, Normal bowel sounds. Rectal exam performed w/permission of pt and ED RN chaperone present.  Anal tone normal.  Non-tender, maroon stool in rectal vault, heme positive.  No fissures, no external hemorrhoids, no palp masses.; Genitourinary: No CVA tenderness; Extremities: Pulses normal, No tenderness, No edema, No calf edema or asymmetry.;  Neuro: AA&Ox3, Major CN grossly intact.  Speech clear. No gross focal motor or sensory deficits in extremities.; Skin: Color normal, Warm, Diaphoretic.   ED Treatments / Results  Labs (all labs ordered are listed, but only abnormal results are displayed)   EKG  EKG Interpretation  Date/Time:  Saturday June 22 2016 20:48:56 EDT Ventricular Rate:  77 PR Interval:    QRS Duration: 100 QT Interval:  412 QTC Calculation: 467 R Axis:   75 Text Interpretation:  Sinus rhythm Consider left atrial enlargement Nonspecific T abnormalities, lateral leads Baseline wander No old tracing to compare Confirmed by [facility]  MD, Nunzio Cory [phone]) on 06/22/2016 8:58:48 PM       Radiology   Procedures Procedures (including critical care time)  Medications Ordered in ED Medications  0.9 %  sodium chloride infusion (not administered)  sodium chloride 0.9 % bolus 1,000 mL (1,000 mLs Intravenous New Bag/Given 06/22/16 2053)  iopamidol (ISOVUE-300) 61 % injection 100 mL (100 mLs  Intravenous Contrast Given 06/22/16 2143)     Initial Impression / Assessment and Plan / ED Course  I have reviewed the triage vital signs and the nursing notes.  Pertinent labs & imaging results that were available during my care of the patient were reviewed by me and considered in my medical decision making (see chart for details).  MDM Reviewed: previous chart, nursing note and vitals Reviewed previous: labs and ECG Interpretation: labs, ECG, x-ray and CT scan Total time providing critical care: 30-74 minutes. This excludes time spent performing separately reportable procedures and services. Consults: admitting MD   CRITICAL CARE Performed by: Alfonzo Feller Total critical care time: 35 minutes Critical care time was exclusive of separately billable procedures and treating other patients. Critical care was necessary to treat or prevent imminent or life-threatening deterioration. Critical care was time spent personally by me on the following activities: development of treatment plan with patient and/or surrogate as well as nursing, discussions with consultants, evaluation of patient's response to treatment, examination of patient, obtaining history from patient or surrogate, ordering and performing treatments and interventions, ordering and review of laboratory studies, ordering and review of radiographic studies, pulse oximetry and re-evaluation of patient's condition.  Results for orders placed or performed during the hospital encounter of 06/22/16  Culture, blood (Routine x 2)  Result Value Ref Range   Specimen Description BLOOD LEFT ARM    Special Requests BOTTLES DRAWN AEROBIC AND ANAEROBIC [facility] EACH    Culture PENDING    Report Status PENDING   Culture, blood (Routine x 2)  Result Value Ref Range   Specimen Description BLOOD RIGHT ARM    Special Requests BOTTLES DRAWN AEROBIC AND ANAEROBIC 4CC EACH    Culture PENDING    Report Status PENDING   Comprehensive metabolic  panel  Result Value Ref Range   Sodium 135 135 - 145 mmol/L   Potassium 3.0 (L) 3.5 - 5.1 mmol/L   Chloride 102 101 - 111 mmol/L   CO2 24 22 - 32 mmol/L   Glucose, Bld 167 (H) 65 - 99 mg/dL   BUN 18 6 - 20 mg/dL   Creatinine, Ser 1.27 (H) 0.61 - 1.24 mg/dL   Calcium 8.7 (L) 8.9 - 10.3 mg/dL   Total Protein 6.5 6.5 - 8.1 g/dL   Albumin 3.6 3.5 - 5.0 g/dL   AST 18 15 - 41 U/L   ALT 16 (L) 17 - 63 U/L   Alkaline Phosphatase 50 38 - 126 U/L   Total Bilirubin  0.5 0.3 - 1.2 mg/dL   GFR calc non Af Amer 56 (L) >60 mL/min   GFR calc Af Amer >60 >60 mL/min   Anion gap 9 5 - 15  CBC with Differential  Result Value Ref Range   WBC 10.0 4.0 - 10.5 K/uL   RBC 3.93 (L) 4.22 - 5.81 MIL/uL   Hemoglobin 11.7 (L) 13.0 - 17.0 g/dL   HCT 35.7 (L) 39.0 - 52.0 %   MCV 90.8 78.0 - 100.0 fL   MCH 29.8 26.0 - 34.0 pg   MCHC 32.8 30.0 - 36.0 g/dL   RDW 14.3 11.5 - 15.5 %   Platelets 243 150 - 400 K/uL   Neutrophils Relative % 30 %   Lymphocytes Relative 59 %   Monocytes Relative 9 %   Eosinophils Relative 2 %   Basophils Relative 0 %   Neutro Abs 3.0 1.7 - 7.7 K/uL   Lymphs Abs 5.9 (H) 0.7 - 4.0 K/uL   Monocytes Absolute 0.9 0.1 - 1.0 K/uL   Eosinophils Absolute 0.2 0.0 - 0.7 K/uL   Basophils Absolute 0.0 0.0 - 0.1 K/uL   WBC Morphology ATYPICAL LYMPHOCYTES   Protime-INR  Result Value Ref Range   Prothrombin Time 12.8 11.4 - 15.2 seconds   INR 0.97   Lipase, blood  Result Value Ref Range   Lipase 33 11 - 51 U/L  Troponin I  Result Value Ref Range   Troponin I <0.03 <0.03 ng/mL  I-Stat CG4 Lactic Acid, ED  Result Value Ref Range   Lactic Acid, Venous 2.76 (HH) 0.5 - 1.9 mmol/L   Comment NOTIFIED PHYSICIAN   I-stat Chem 8, ED  Result Value Ref Range   Sodium 137 135 - 145 mmol/L   Potassium 5.6 (H) 3.5 - 5.1 mmol/L   Chloride 107 101 - 111 mmol/L   BUN 24 (H) 6 - 20 mg/dL   Creatinine, Ser 1.20 0.61 - 1.24 mg/dL   Glucose, Bld 146 (H) 65 - 99 mg/dL   Calcium, Ion 0.91 (L) 1.15 -  1.40 mmol/L   TCO2 23 0 - 100 mmol/L   Hemoglobin 12.2 (L) 13.0 - 17.0 g/dL   HCT 36.0 (L) 39.0 - 52.0 %  POC occult blood, ED  Result Value Ref Range   Fecal Occult Bld POSITIVE (A) NEGATIVE  I-stat troponin, ED  Result Value Ref Range   Troponin i, poc 0.00 0.00 - 0.08 ng/mL   Comment 3          Type and screen [facility]  Result Value Ref Range   ABO/RH(D) AB POS    Antibody Screen NEG    Sample Expiration 06/25/2016     Ct Abdomen Pelvis W Contrast Result Date: 06/22/2016 CLINICAL DATA:  Acute onset of severe generalized abdominal pain. Bright red rectal bleeding. Diaphoresis. Initial encounter. EXAM: CT ABDOMEN AND PELVIS WITH CONTRAST TECHNIQUE: Multidetector CT imaging of the abdomen and pelvis was performed using the standard protocol following bolus administration of intravenous contrast. CONTRAST:  189mL ISOVUE-300 IOPAMIDOL (ISOVUE-300) INJECTION 61% COMPARISON:  CT of the abdomen and pelvis from 11/22/2014 FINDINGS: Lower chest: Minimal bibasilar atelectasis or scarring is noted. Scattered coronary artery calcifications are seen. Hepatobiliary: The liver is unremarkable in appearance. The gallbladder is unremarkable in appearance. The common bile duct remains normal in caliber. Pancreas: There is mild dilatation of the pancreatic duct to 4-5 mm in maximal diameter, of uncertain significance. The pancreas is otherwise unremarkable. Spleen: The spleen is unremarkable in  appearance. Adrenals/Urinary Tract: The adrenal glands are unremarkable in appearance. Small bilateral renal cysts are seen. Nonspecific perinephric stranding is noted bilaterally. There is no evidence of hydronephrosis. No renal or ureteral stones are identified. Stomach/Bowel: The stomach is unremarkable in appearance. The small bowel is within normal limits. The appendix is not visualized; there is no evidence for appendicitis. Mild diverticulosis is noted along the ascending colon. Mildly prominent  vasculature is noted extending along the rectal wall, raising concern for internal hemorrhoids. Vascular/Lymphatic: Scattered calcification is seen along the abdominal aorta and its branches. Mild mural thrombus is seen along the common iliac arteries bilaterally. The abdominal aorta is otherwise grossly unremarkable. The inferior vena cava is grossly unremarkable. No retroperitoneal lymphadenopathy is seen. No pelvic sidewall lymphadenopathy is identified. Reproductive: The bladder is mildly distended and grossly unremarkable. The prostate is enlarged, measuring 5.3 cm in transverse dimension. Other: No additional soft tissue abnormalities are seen. Musculoskeletal: No acute osseous abnormalities are identified. Multilevel vacuum phenomenon is noted along the lumbar spine, with endplate sclerotic change. The visualized musculature is unremarkable in appearance. IMPRESSION: 1. Mildly prominent vasculature extending along the rectal wall, raising concern for internal hemorrhoids. 2. Mild dilatation of the pancreatic duct to 4-5 mm in maximal diameter, of uncertain significance. Pancreas otherwise unremarkable in appearance. This appears relatively stable from 2016 and may reflect the patient's baseline. 3. Scattered bilateral renal cysts. 4. Mild diverticulosis along the ascending colon, without diverticulitis. 5. Scattered aortic atherosclerosis. 6. Enlarged prostate noted. 7. Degenerative change along the lumbar spine. Electronically Signed   By: Garald Balding M.D.   On: 06/22/2016 22:39   Dg Chest Port 1 View Result Date: 06/22/2016 CLINICAL DATA:  Abdominal pain and rectal bleeding. EXAM: PORTABLE CHEST 1 VIEW COMPARISON:  Chest radiograph 11/15/2014 FINDINGS: Lower lung volumes from prior exam. Stable elevation of left hemidiaphragm. Heart size and mediastinal contours are unchanged, borderline cardiomegaly. No pulmonary edema, large pleural effusion or pneumothorax. Stable osseous structures. Air within the  stomach in the left upper quadrant. No obvious free air under the hemidiaphragm. Abdominal CT is planned. IMPRESSION: Borderline cardiomegaly.  Chronic elevation of left hemidiaphragm. Electronically Signed   By: Jeb Levering M.D.   On: 06/22/2016 21:24   Results for COLMAN, BIRDWELL (MRN [id number]) as of 06/22/2016 22:47  Ref. Range 03/17/2012 17:14 10/16/2012 10:03 10/20/2012 00:00 06/22/2016 20:50 06/22/2016 20:52  Hemoglobin Latest Ref Range: 13.0 - 17.0 g/dL 15.9 15.1 13.6 11.7 (L) 12.2 (L)  HCT Latest Ref Range: 39.0 - 52.0 % 46.6 43.8 41 35.7 (L) 36.0 (L)    2300:  H/H lower than previous; T&S ordered. BP improved with IVF boluses. T/C to GI Dr. Laural Golden, case discussed, including:  HPI, pertinent PM/SHx, VS/PE, dx testing, ED course and treatment:  Agreeable to consult tomorrow. T/C to [facility]. Marin Comment, case discussed, including:  HPI, pertinent PM/SHx, VS/PE, dx testing, ED course and treatment:  Agreeable to come to ED for evaluation.     Final Clinical Impressions(s) / ED Diagnoses   Final diagnoses:  None    New Prescriptions New Prescriptions   No medications on file     Francine Graven, DO 06/25/16 1851

## 2016-06-22 NOTE — ED Notes (Signed)
EKG obtained and given to Dr. Thurnell [facility].

## 2016-06-22 NOTE — ED Notes (Signed)
Patient transported to CT 

## 2016-06-22 NOTE — H&P (Signed)
History and Physical    Sean Leonard:[id number] DOB: [birth year] DOA: 06/22/2016  PCP: Pcp Not In System  Patient coming from: Home.    Chief Complaint:   Painless BRBPR.   HPI: Sean Leonard is an 71 y.o. male with hx of known diverticulosis with colonoscopy by Dr Felipe Drone 3 years ago, hx of Barretts' s/p EGD same time, DDD, hypothyrodism, bilateral iliac artery aneurysm, presented to the ER with BRBPR.  He had abdominal discomfort a couple of days ago, but has none now.  No black stool or epigastric pain.   He denied using NSAIDS, and had not been on ASA due to his prior hx of PUD.  He did feel lightheaded, and on presentation in the ER, he was hypotensive, but responded to IVF.  Hb was found to be at 12.2g per dl, normal BUN, and normal INR.  He has normal renal fx tests.  His BP on presentation was in the 70's, and now 110 without significant IVF.  A CT of the abd pelvis was done showing showing enlarge pancreatic duct, suspicious for chronic enlargement, and diverticulosis, with internal hemorrhoid.  ED Course:  See above.  Rewiew of Systems:  Constitutional: Negative for malaise, fever and chills. No significant weight loss or weight gain Eyes: Negative for eye pain, redness and discharge, diplopia, visual changes, or flashes of light. ENMT: Negative for ear pain, hoarseness, nasal congestion, sinus pressure and sore throat. No headaches; tinnitus, drooling, or problem swallowing. Cardiovascular: Negative for chest pain, palpitations, diaphoresis, dyspnea and peripheral edema. ; No orthopnea, PND Respiratory: Negative for cough, hemoptysis, wheezing and stridor. No pleuritic chestpain. Gastrointestinal: Negative for diarrhea, constipation,  melena, hematemesis, jaundice and rectal bleeding.    Genitourinary: Negative for frequency, dysuria, incontinence,flank pain and hematuria; Musculoskeletal: Negative for back pain and neck pain. Negative for swelling and trauma.;  Skin: .  Negative for pruritus, rash, abrasions, bruising and skin lesion.; ulcerations Neuro: Negative for headache, lightheadedness and neck stiffness. Negative for weakness, altered level of consciousness , altered mental status, extremity weakness, burning feet, involuntary movement, seizure and syncope.  Psych: negative for anxiety, depression, insomnia, tearfulness, panic attacks, hallucinations, paranoia, suicidal or homicidal ideation   Past Medical History:  Diagnosis Date  . DDD (degenerative disc disease), lumbar   . Diverticula of colon   . Iliac artery aneurysm, bilateral (HCC)    with significant stenosis of right internal iliac artery  . Peptic ulcer   . Prostatitis   . Thyroid disease     Past Surgical History:  Procedure Laterality Date  . COLONOSCOPY  April 2005   Dr. Irving Shows. Small internal hemorrhoids. Multiple small scattered diverticula in the cecum and descending colon.  . COLONOSCOPY N/A 01/18/2014   Procedure: COLONOSCOPY;  Surgeon: Daneil Dolin, MD;  Location: [facility];  Service: Endoscopy;  Laterality: N/A;  1100  . COLONOSCOPY WITH ESOPHAGOGASTRODUODENOSCOPY (EGD) N/A 12/16/2012   Dr. Gala Romney: rectal and colonic polyps with inadequate preparation, tubular adenoma. EGD with biopsy proven short segment Barrett's esophagus, hiatal hernia, mild chronic inactive gastritis   . ESOPHAGOGASTRODUODENOSCOPY  July 2005   Dr. Irving Shows grade 2 esophagitis at the GE junction. Acute gastritis. Multiple acute active, deep, irregular, friable ulcers in the antrum and body of the stomach. 2 active, deep, friable, irregular ulcers in the duodenum. Pathology not available.  . ESOPHAGOGASTRODUODENOSCOPY N/A 01/18/2014   Procedure: ESOPHAGOGASTRODUODENOSCOPY (EGD);  Surgeon: Daneil Dolin, MD;  Location: [facility];  Service: Endoscopy;  Laterality: N/A;  . THYROIDECTOMY    . TONSILLECTOMY       reports that he quit smoking about 9 years ago. His smoking use included  Cigars. He quit after 25.00 years of use. He has never used smokeless tobacco. He reports that he does not drink alcohol or use drugs.  No Known Allergies  Family History  Problem Relation Age of Onset  . Hypertension Mother   . Heart disease Mother     before age 43  . Heart disease Father     before age 4  . Hyperlipidemia Sister   . Cancer Sister   . Colon cancer Neg Hx   . Liver disease Neg Hx      Prior to Admission medications   Medication Sig Start Date End Date Taking? Authorizing Provider  fish oil-omega-3 fatty acids 1000 MG capsule Take 2 g by mouth daily.   Yes Historical Provider, MD  levothyroxine (SYNTHROID, LEVOTHROID) 112 MCG tablet Take 112 mcg by mouth every morning.   Yes Historical Provider, MD  Multiple Vitamin (MULTIVITAMIN WITH MINERALS) TABS tablet Take 1 tablet by mouth daily.   Yes Historical Provider, MD  omeprazole (PRILOSEC) 20 MG capsule TAKE 1 CAPSULE EVERY DAY 12/27/15  Yes Mahala Menghini, PA-C  pravastatin (PRAVACHOL) 20 MG tablet Take 20 mg by mouth at bedtime.   Yes Historical Provider, MD    Physical Exam: Vitals:   06/22/16 2040 06/22/16 2057 06/22/16 2100 06/22/16 2130  BP: (!) 77/55  (!) 85/65 113/69  Pulse: (!) 102  87 73  Resp: 18  (!) 23 14  Temp: 97.1 F (36.2 C) 97.6 F (36.4 C)    TempSrc: Tympanic Rectal    SpO2: 100%  99% 97%      Constitutional: NAD, calm, comfortable Vitals:   06/22/16 2040 06/22/16 2057 06/22/16 2100 06/22/16 2130  BP: (!) 77/55  (!) 85/65 113/69  Pulse: (!) 102  87 73  Resp: 18  (!) 23 14  Temp: 97.1 F (36.2 C) 97.6 F (36.4 C)    TempSrc: Tympanic Rectal    SpO2: 100%  99% 97%   Eyes: PERRL, lids and conjunctivae normal ENMT: Mucous membranes are moist. Posterior pharynx clear of any exudate or lesions.Normal dentition.  Neck: normal, supple, no masses, no thyromegaly Respiratory: clear to auscultation bilaterally, no wheezing, no crackles. Normal respiratory effort. No accessory muscle  use.  Cardiovascular: Regular rate and rhythm, no murmurs / rubs / gallops. No extremity edema. 2+ pedal pulses. No carotid bruits.  Abdomen: no tenderness, no masses palpated. No hepatosplenomegaly. Bowel sounds positive.  Musculoskeletal: no clubbing / cyanosis. No joint deformity upper and lower extremities. Good ROM, no contractures. Normal muscle tone.  Skin: no rashes, lesions, ulcers. No induration Neurologic: CN 2-12 grossly intact. Sensation intact, DTR normal. Strength 5/5 in all 4.  Psychiatric: Normal judgment and insight. Alert and oriented x 3. Normal mood.    Labs on Admission: I have personally reviewed following labs and imaging studies  CBC:  Recent Labs Lab 06/22/16 2050 06/22/16 2052  WBC 10.0  --   NEUTROABS 3.0  --   HGB 11.7* 12.2*  HCT 35.7* 36.0*  MCV 90.8  --   PLT 243  --    Basic Metabolic Panel:  Recent Labs Lab 06/22/16 2050 06/22/16 2052  NA 135 137  K 3.0* 5.6*  CL 102 107  CO2 24  --   GLUCOSE 167* 146*  BUN 18 24*  CREATININE 1.27* 1.20  CALCIUM 8.7*  --    GFR: CrCl cannot be calculated (Unknown ideal weight.). Liver Function Tests:  Recent Labs Lab 06/22/16 2050  AST 18  ALT 16*  ALKPHOS 50  BILITOT 0.5  PROT 6.5  ALBUMIN 3.6    Recent Labs Lab 06/22/16 2050  LIPASE 33   Coagulation Profile:  Recent Labs Lab 06/22/16 2050  INR 0.97   Cardiac Enzymes:  Recent Labs Lab 06/22/16 2050  TROPONINI <0.03    Urine analysis:    Component Value Date/Time   COLORURINE AMBER (A) 10/16/2012 1026   APPEARANCEUR CLEAR 10/16/2012 1026   LABSPEC >1.030 (H) 10/16/2012 1026   PHURINE 5.5 10/16/2012 1026   GLUCOSEU NEGATIVE 10/16/2012 1026   HGBUR MODERATE (A) 10/16/2012 1026   BILIRUBINUR SMALL (A) 10/16/2012 1026   KETONESUR 15 (A) 10/16/2012 1026   PROTEINUR 100 (A) 10/16/2012 1026   UROBILINOGEN 1.0 10/16/2012 1026   NITRITE NEGATIVE 10/16/2012 1026   LEUKOCYTESUR NEGATIVE 10/16/2012 1026   ) Recent Results  (from the past 240 hour(s))  Culture, blood (Routine x 2)     Status: None (Preliminary result)   Collection Time: 06/22/16  9:08 PM  Result Value Ref Range Status   Specimen Description BLOOD LEFT ARM  Final   Special Requests BOTTLES DRAWN AEROBIC AND ANAEROBIC [facility] EACH  Final   Culture PENDING  Incomplete   Report Status PENDING  Incomplete  Culture, blood (Routine x 2)     Status: None (Preliminary result)   Collection Time: 06/22/16  9:25 PM  Result Value Ref Range Status   Specimen Description BLOOD RIGHT ARM  Final   Special Requests BOTTLES DRAWN AEROBIC AND ANAEROBIC 4CC EACH  Final   Culture PENDING  Incomplete   Report Status PENDING  Incomplete     Radiological Exams on Admission: Ct Abdomen Pelvis W Contrast  Result Date: 06/22/2016 CLINICAL DATA:  Acute onset of severe generalized abdominal pain. Bright red rectal bleeding. Diaphoresis. Initial encounter. EXAM: CT ABDOMEN AND PELVIS WITH CONTRAST TECHNIQUE: Multidetector CT imaging of the abdomen and pelvis was performed using the standard protocol following bolus administration of intravenous contrast. CONTRAST:  147mL ISOVUE-300 IOPAMIDOL (ISOVUE-300) INJECTION 61% COMPARISON:  CT of the abdomen and pelvis from 11/22/2014 FINDINGS: Lower chest: Minimal bibasilar atelectasis or scarring is noted. Scattered coronary artery calcifications are seen. Hepatobiliary: The liver is unremarkable in appearance. The gallbladder is unremarkable in appearance. The common bile duct remains normal in caliber. Pancreas: There is mild dilatation of the pancreatic duct to 4-5 mm in maximal diameter, of uncertain significance. The pancreas is otherwise unremarkable. Spleen: The spleen is unremarkable in appearance. Adrenals/Urinary Tract: The adrenal glands are unremarkable in appearance. Small bilateral renal cysts are seen. Nonspecific perinephric stranding is noted bilaterally. There is no evidence of hydronephrosis. No renal or ureteral stones  are identified. Stomach/Bowel: The stomach is unremarkable in appearance. The small bowel is within normal limits. The appendix is not visualized; there is no evidence for appendicitis. Mild diverticulosis is noted along the ascending colon. Mildly prominent vasculature is noted extending along the rectal wall, raising concern for internal hemorrhoids. Vascular/Lymphatic: Scattered calcification is seen along the abdominal aorta and its branches. Mild mural thrombus is seen along the common iliac arteries bilaterally. The abdominal aorta is otherwise grossly unremarkable. The inferior vena cava is grossly unremarkable. No retroperitoneal lymphadenopathy is seen. No pelvic sidewall lymphadenopathy is identified. Reproductive: The bladder is mildly distended and grossly unremarkable. The prostate is enlarged, measuring 5.3 cm in  transverse dimension. Other: No additional soft tissue abnormalities are seen. Musculoskeletal: No acute osseous abnormalities are identified. Multilevel vacuum phenomenon is noted along the lumbar spine, with endplate sclerotic change. The visualized musculature is unremarkable in appearance. IMPRESSION: 1. Mildly prominent vasculature extending along the rectal wall, raising concern for internal hemorrhoids. 2. Mild dilatation of the pancreatic duct to 4-5 mm in maximal diameter, of uncertain significance. Pancreas otherwise unremarkable in appearance. This appears relatively stable from 2016 and may reflect the patient's baseline. 3. Scattered bilateral renal cysts. 4. Mild diverticulosis along the ascending colon, without diverticulitis. 5. Scattered aortic atherosclerosis. 6. Enlarged prostate noted. 7. Degenerative change along the lumbar spine. Electronically Signed   By: Garald Balding M.D.   On: 06/22/2016 22:39   Dg Chest Port 1 View  Result Date: 06/22/2016 CLINICAL DATA:  Abdominal pain and rectal bleeding. EXAM: PORTABLE CHEST 1 VIEW COMPARISON:  Chest radiograph 11/15/2014  FINDINGS: Lower lung volumes from prior exam. Stable elevation of left hemidiaphragm. Heart size and mediastinal contours are unchanged, borderline cardiomegaly. No pulmonary edema, large pleural effusion or pneumothorax. Stable osseous structures. Air within the stomach in the left upper quadrant. No obvious free air under the hemidiaphragm. Abdominal CT is planned. IMPRESSION: Borderline cardiomegaly.  Chronic elevation of left hemidiaphragm. Electronically Signed   By: Jeb Levering M.D.   On: 06/22/2016 21:24   Assessment/Plan Principal Problem:   Lower GI bleed Active Problems:   Barrett's esophagus   PLAN:   LGIB:  Suspicious for diverticular bleed, though internal hemorrhoid, AVM, and malignancy cannot be excluded.  Will admit for OBS, check serial H and H every 6 hours, and give IVF with bolus.  Give clear liquid.   Hypotension:  Suspect he had a vagal reaction.  BP returned without much IVF.  Will follow.  Hx of Barretts and PUD:  No NSAIDS use and no ASA.  No evidence of UGIB.   DVT prophylaxis: SCD.  Code Status: FULL CODE.  Family Communication: Wife and brother at bedside.  Disposition Plan: Home.  Consults called: Dr Dereck Leep.  Admission status: OBS.    Vergil Burby MD FACP. [facility]  If 7PM-7AM, please contact night-coverage [email or web address] Password [facility]  06/22/2016, 11:33 PM

## 2016-06-22 NOTE — ED Notes (Signed)
X-ray at bedside

## 2016-06-22 NOTE — ED Notes (Signed)
Dr. Thurnell Garbe made aware of elevated lactic acid

## 2016-06-23 ENCOUNTER — Observation Stay (HOSPITAL_COMMUNITY): Payer: Medicare HMO

## 2016-06-23 ENCOUNTER — Encounter (HOSPITAL_COMMUNITY)
Admission: EM | Disposition: A | Discharge status: Home / Self Care | Payer: Self-pay | Source: Home / Self Care | Attending: Internal Medicine

## 2016-06-23 ENCOUNTER — Encounter (HOSPITAL_COMMUNITY): Payer: Self-pay | Admitting: *Deleted

## 2016-06-23 DIAGNOSIS — R109 Unspecified abdominal pain: Secondary | ICD-10-CM | POA: Diagnosis not present

## 2016-06-23 DIAGNOSIS — K91840 Postprocedural hemorrhage and hematoma of a digestive system organ or structure following a digestive system procedure: Secondary | ICD-10-CM | POA: Diagnosis not present

## 2016-06-23 DIAGNOSIS — E876 Hypokalemia: Secondary | ICD-10-CM | POA: Diagnosis present

## 2016-06-23 DIAGNOSIS — D72829 Elevated white blood cell count, unspecified: Secondary | ICD-10-CM | POA: Diagnosis not present

## 2016-06-23 DIAGNOSIS — K5731 Diverticulosis of large intestine without perforation or abscess with bleeding: Secondary | ICD-10-CM | POA: Diagnosis present

## 2016-06-23 DIAGNOSIS — K2270 Barrett's esophagus without dysplasia: Secondary | ICD-10-CM | POA: Diagnosis present

## 2016-06-23 DIAGNOSIS — K922 Gastrointestinal hemorrhage, unspecified: Secondary | ICD-10-CM | POA: Diagnosis not present

## 2016-06-23 DIAGNOSIS — K5793 Diverticulitis of intestine, part unspecified, without perforation or abscess with bleeding: Secondary | ICD-10-CM | POA: Diagnosis not present

## 2016-06-23 DIAGNOSIS — Z8711 Personal history of peptic ulcer disease: Secondary | ICD-10-CM | POA: Diagnosis not present

## 2016-06-23 DIAGNOSIS — K625 Hemorrhage of anus and rectum: Secondary | ICD-10-CM | POA: Diagnosis present

## 2016-06-23 DIAGNOSIS — E039 Hypothyroidism, unspecified: Secondary | ICD-10-CM | POA: Diagnosis present

## 2016-06-23 DIAGNOSIS — Z79899 Other long term (current) drug therapy: Secondary | ICD-10-CM | POA: Diagnosis not present

## 2016-06-23 DIAGNOSIS — D62 Acute posthemorrhagic anemia: Secondary | ICD-10-CM | POA: Diagnosis present

## 2016-06-23 DIAGNOSIS — D696 Thrombocytopenia, unspecified: Secondary | ICD-10-CM | POA: Diagnosis not present

## 2016-06-23 DIAGNOSIS — K644 Residual hemorrhoidal skin tags: Secondary | ICD-10-CM | POA: Diagnosis present

## 2016-06-23 DIAGNOSIS — Z87891 Personal history of nicotine dependence: Secondary | ICD-10-CM | POA: Diagnosis not present

## 2016-06-23 DIAGNOSIS — F419 Anxiety disorder, unspecified: Secondary | ICD-10-CM | POA: Diagnosis present

## 2016-06-23 DIAGNOSIS — K921 Melena: Secondary | ICD-10-CM | POA: Diagnosis not present

## 2016-06-23 DIAGNOSIS — Q438 Other specified congenital malformations of intestine: Secondary | ICD-10-CM | POA: Diagnosis not present

## 2016-06-23 DIAGNOSIS — D120 Benign neoplasm of cecum: Secondary | ICD-10-CM | POA: Diagnosis present

## 2016-06-23 DIAGNOSIS — K633 Ulcer of intestine: Secondary | ICD-10-CM | POA: Diagnosis present

## 2016-06-23 DIAGNOSIS — I723 Aneurysm of iliac artery: Secondary | ICD-10-CM | POA: Diagnosis present

## 2016-06-23 DIAGNOSIS — I959 Hypotension, unspecified: Secondary | ICD-10-CM | POA: Diagnosis not present

## 2016-06-23 DIAGNOSIS — K648 Other hemorrhoids: Secondary | ICD-10-CM | POA: Diagnosis present

## 2016-06-23 DIAGNOSIS — Y848 Other medical procedures as the cause of abnormal reaction of the patient, or of later complication, without mention of misadventure at the time of the procedure: Secondary | ICD-10-CM | POA: Diagnosis not present

## 2016-06-23 DIAGNOSIS — R578 Other shock: Secondary | ICD-10-CM | POA: Diagnosis not present

## 2016-06-23 DIAGNOSIS — E872 Acidosis: Secondary | ICD-10-CM | POA: Diagnosis present

## 2016-06-23 HISTORY — PX: COLONOSCOPY: SHX5424

## 2016-06-23 LAB — CBC
HEMATOCRIT: 29.4 % — AB (ref 39.0–52.0)
HEMATOCRIT: 30.5 % — AB (ref 39.0–52.0)
HEMATOCRIT: 25.9 % — AB (ref 39.0–52.0)
HEMATOCRIT: 23.8 % — AB (ref 39.0–52.0)
HEMOGLOBIN: 9.9 g/dL — AB (ref 13.0–17.0)
HEMOGLOBIN: 8.5 g/dL — AB (ref 13.0–17.0)
Hemoglobin: 10 g/dL — ABNORMAL LOW (ref 13.0–17.0)
Hemoglobin: 8 g/dL — ABNORMAL LOW (ref 13.0–17.0)
MCH: 30.5 pg (ref 26.0–34.0)
MCH: 29.7 pg (ref 26.0–34.0)
MCH: 29.5 pg (ref 26.0–34.0)
MCH: 29.9 pg (ref 26.0–34.0)
MCHC: 33.7 g/dL (ref 30.0–36.0)
MCHC: 32.8 g/dL (ref 30.0–36.0)
MCHC: 32.8 g/dL (ref 30.0–36.0)
MCHC: 33.6 g/dL (ref 30.0–36.0)
MCV: 90.5 fL (ref 78.0–100.0)
MCV: 90.5 fL (ref 78.0–100.0)
MCV: 89.9 fL (ref 78.0–100.0)
MCV: 88.8 fL (ref 78.0–100.0)
PLATELETS: 230 10*3/uL (ref 150–400)
Platelets: 187 10*3/uL (ref 150–400)
Platelets: 198 10*3/uL (ref 150–400)
Platelets: 164 10*3/uL (ref 150–400)
RBC: 3.25 MIL/uL — ABNORMAL LOW (ref 4.22–5.81)
RBC: 3.37 MIL/uL — ABNORMAL LOW (ref 4.22–5.81)
RBC: 2.88 MIL/uL — ABNORMAL LOW (ref 4.22–5.81)
RBC: 2.68 MIL/uL — ABNORMAL LOW (ref 4.22–5.81)
RDW: 14.5 % (ref 11.5–15.5)
RDW: 14.5 % (ref 11.5–15.5)
RDW: 14.6 % (ref 11.5–15.5)
RDW: 15.1 % (ref 11.5–15.5)
WBC: 9.4 10*3/uL (ref 4.0–10.5)
WBC: 8.9 10*3/uL (ref 4.0–10.5)
WBC: 10.4 10*3/uL (ref 4.0–10.5)
WBC: 9.8 10*3/uL (ref 4.0–10.5)

## 2016-06-23 LAB — MAGNESIUM: Magnesium: 1.8 mg/dL (ref 1.7–2.4)

## 2016-06-23 LAB — LACTIC ACID, PLASMA
LACTIC ACID, VENOUS: 1.9 mmol/L (ref 0.5–1.9)
Lactic Acid, Venous: 2.5 mmol/L (ref 0.5–1.9)

## 2016-06-23 LAB — ABO/RH: ABO/RH(D): AB POS

## 2016-06-23 LAB — GLUCOSE, CAPILLARY: Glucose-Capillary: 152 mg/dL — ABNORMAL HIGH (ref 65–99)

## 2016-06-23 LAB — COMPREHENSIVE METABOLIC PANEL
ALK PHOS: 38 U/L (ref 38–126)
ALT: 13 U/L — AB (ref 17–63)
AST: 20 U/L (ref 15–41)
Albumin: 2.9 g/dL — ABNORMAL LOW (ref 3.5–5.0)
Anion gap: 5 (ref 5–15)
BILIRUBIN TOTAL: 0.7 mg/dL (ref 0.3–1.2)
BUN: 13 mg/dL (ref 6–20)
CALCIUM: 8.1 mg/dL — AB (ref 8.9–10.3)
CO2: 23 mmol/L (ref 22–32)
CREATININE: 0.83 mg/dL (ref 0.61–1.24)
Chloride: 111 mmol/L (ref 101–111)
GFR calc Af Amer: >60 mL/min (ref >60)
Glucose, Bld: 148 mg/dL — ABNORMAL HIGH (ref 65–99)
Potassium: 3.5 mmol/L (ref 3.5–5.1)
Sodium: 139 mmol/L (ref 135–145)
TOTAL PROTEIN: 5.2 g/dL — AB (ref 6.5–8.1)

## 2016-06-23 LAB — PREPARE RBC (CROSSMATCH)

## 2016-06-23 LAB — TROPONIN I: Troponin I: <0.03 ng/mL (ref <0.03)

## 2016-06-23 SURGERY — COLONOSCOPY
Anesthesia: Moderate Sedation

## 2016-06-23 MED ORDER — PANTOPRAZOLE SODIUM 40 MG PO TBEC
40.0000 mg | DELAYED_RELEASE_TABLET | Freq: Every day | ORAL | Status: DC
  Administered 2016-06-23 – 2016-06-24 (×2): 40 mg via ORAL
  Filled 2016-06-23 (×2): qty 1

## 2016-06-23 MED ORDER — SODIUM CHLORIDE 0.9 % IV BOLUS (SEPSIS)
1000.0000 mL | Freq: Once | INTRAVENOUS | Status: AC
  Administered 2016-06-23: 1000 mL via INTRAVENOUS

## 2016-06-23 MED ORDER — SODIUM CHLORIDE 0.9 % IV SOLN
Freq: Once | INTRAVENOUS | Status: AC
  Administered 2016-06-23: 17:00:00 via INTRAVENOUS

## 2016-06-23 MED ORDER — OMEGA-3-ACID ETHYL ESTERS 1 G PO CAPS
2.0000 g | ORAL_CAPSULE | Freq: Every day | ORAL | Status: DC
  Administered 2016-06-23 – 2016-06-30 (×5): 2 g via ORAL
  Filled 2016-06-23 (×5): qty 2

## 2016-06-23 MED ORDER — DEXTROSE-NACL 5-0.9 % IV SOLN
INTRAVENOUS | Status: DC
  Administered 2016-06-23 (×2): via INTRAVENOUS

## 2016-06-23 MED ORDER — SODIUM CHLORIDE 0.9% FLUSH
3.0000 mL | Freq: Two times a day (BID) | INTRAVENOUS | Status: DC
  Administered 2016-06-23 – 2016-06-25 (×5): 3 mL via INTRAVENOUS

## 2016-06-23 MED ORDER — LORAZEPAM 1 MG PO TABS
1.0000 mg | ORAL_TABLET | Freq: Once | ORAL | Status: AC
Start: 2016-06-23 — End: 2016-06-23
  Administered 2016-06-23: 1 mg via ORAL
  Filled 2016-06-23: qty 1

## 2016-06-23 MED ORDER — PEG 3350-KCL-NA BICARB-NACL 420 G PO SOLR
4000.0000 mL | Freq: Once | ORAL | Status: AC
  Administered 2016-06-23: 4000 mL via ORAL
  Filled 2016-06-23: qty 4000

## 2016-06-23 MED ORDER — SODIUM CHLORIDE 0.9 % IV SOLN
INTRAVENOUS | Status: DC
  Administered 2016-06-23 – 2016-06-25 (×6): via INTRAVENOUS

## 2016-06-23 MED ORDER — BOOST / RESOURCE BREEZE PO LIQD
1.0000 | Freq: Three times a day (TID) | ORAL | Status: DC
  Administered 2016-06-23 – 2016-06-30 (×11): 1 via ORAL

## 2016-06-23 MED ORDER — LEVOTHYROXINE SODIUM 112 MCG PO TABS
112.0000 ug | ORAL_TABLET | Freq: Every day | ORAL | Status: DC
  Administered 2016-06-23 – 2016-06-30 (×7): 112 ug via ORAL
  Filled 2016-06-23 (×7): qty 1

## 2016-06-23 MED ORDER — PRAVASTATIN SODIUM 20 MG PO TABS
20.0000 mg | ORAL_TABLET | Freq: Every day | ORAL | Status: DC
  Administered 2016-06-23 – 2016-06-29 (×7): 20 mg via ORAL
  Filled 2016-06-23 (×4): qty 1
  Filled 2016-06-23 (×2): qty 2
  Filled 2016-06-23: qty 1

## 2016-06-23 NOTE — Progress Notes (Addendum)
PROGRESS NOTE    Sean Leonard  HGD:[id number] DOB: 09/06/1945 DOA: 06/22/2016 PCP: Pcp Not In System   Brief Narrative:  Sean Leonard is an 71 y.o. male with hx of known diverticulosis with colonoscopy by Dr Felipe Drone 3 years ago, hx of Barretts' s/p EGD same time, DDD, hypothyrodism, bilateral iliac artery aneurysm, presented to the ER with BRBPR.  He had abdominal discomfort a couple of days ago, but has none now.  No black stool or epigastric pain.   He denied using NSAIDS, and had not been on ASA due to his prior hx of PUD.  He did feel lightheaded, and on presentation in the ER, he was hypotensive, but responded to IVF.  Hb was found to be at 12.2g per dl, normal BUN, and normal INR.  He has normal renal fx tests.  His BP on presentation was in the 70's, and now 110 without significant IVF.  A CT of the abd pelvis was done showing showing enlarge pancreatic duct, suspicious for chronic enlargement, and diverticulosis, with internal hemorrhoid.  06/23/16- 12.34pm, overhead rapid response page. Pt was found on the floor, passed out after having a large bloody bowel movement. I went immediately to pt bedside, pt was on the bed, vitals- 105/73, pulse - 105, 100% RA. Nurse noted that pt had reduced respirations. Pt was awake, but with eyes closed, denied chest pain, SOB, but endorsed lower abdominal pain, which he felt had increased.  On exam- appeared weak, regular heart sounds, equal breath sounds bilat, PERRL, Strenght equal in all extremities, lower abdominal tenderness. IVF bolus 1L was ordered, Stat labs- CBC, CMP, Trop, Blood cultures, Mag,  type and match 2 units, Stat EKG-sinus tach, otherwise unchanged from prior. CBG- 152. Repeat BP- 94/54, then, placed pt in trendelenburg position. Gi was paged, they will see pt.  06/23/16- 3.47pm, Page over head code stroke. Went to pts bedside. He was complaining of numbness on his left UE. On exam he had no focal deficits. Cranial nerves 2-12 intact.  PERRL. Sensation- normal- face and all extremities, strenght 5/5 all extremities, DTR- 2+ all extremities. Stat Head Ct ordered negative for bleed. Will order MRI.  Assessment & Plan:   Principal Problem:   Lower GI bleed Active Problems:   Barrett's esophagus  LGIB:  Suspicious for diverticular bleed considering prior colonoscopy findings., though internal hemorrhoid, AVM, and malignancy cannot be excluded.   - CBC Q8H, and give IVF with bolus.  - Give clear liquid.  - Spoke with GI, they will do a lower GI endoscopy. - F/u stat labs - Hemoglobin 8.5, transfuse 1 unit - Lactic acidosis resolved - Continue IV hydration - GI recs appreciated - Transfer to step down, ongoing GI bleed - Changed obs to inpatient  Syncopal episode, Hypotension: Suspect he had a 2nd vasovagal syncope, considering amount of blood he passed. Hgb has been stable.  - Will follow GI recs to transfuse if hgb <9. - F/u stat labs- CBC, CMP, trop, Mag, blood cultures - Bolus 1L N/s, continue normal saline at 100 mL an hour - Closely monitor.  Left upper extremity numbness- concern for intracranial hemorrhage considering patient's just fell though he denied hitting his head, and on requestioning later confirms he didn't hit his head, he says he was aware of events throughout syncopal episode earlier. thorough neuro exam nonfocal. - Stat head CT - MRI brain, when available. This most likely is  due to patient's anxiety and wife,  Confirms patient has significant  anxiety. We will hold off on transferring patient to [facility] for urgent MRI, unless symptoms worsen. - Lipid panel a.m, hemoglobin A1c, PT OT eval - Hold any blood pressure lowering agents.   Hx of Barretts and PUD:  No NSAIDS use and no ASA.  No evidence of UGIB.  DVT prophylaxis:  SCDs  Code Status: Full Family Communication: Wife present at bedside earlier in the day. Also communicated with her later today about husbands syncopal and CVA-like symptoms.    Disposition Plan: Home  Consultants:   Gastroenterology  Procedures:   Antimicrobials: None   Subjective: See above. Bp improved.   Objective: Vitals:   06/23/16 1252 06/23/16 1254 06/23/16 1303 06/23/16 1314  BP: (!) 94/54 98/61 98/63  99/61  Pulse: 92 92 92 89  Resp:   14 15  Temp: 98.1 F (36.7 C)     TempSrc: Oral     SpO2: 100% 100% 100% 100%  Weight:      Height:        Intake/Output Summary (Last 24 hours) at 06/23/16 1323 Last data filed at 06/23/16 0942  Gross per 24 hour  Intake             1920 ml  Output              300 ml  Net             1620 ml   Filed Weights   06/23/16 0017  Weight: 79.7 kg (175 lb 11.3 oz)    Examination:  General exam: Awake ,appears weak. Respiratory system: Clear to auscultation. Respiratory effort normal. Cardiovascular system: S1 & S2 heard, RRR. No JVD, murmurs, rubs, gallops or clicks. No pedal edema. Gastrointestinal system: Tender lower abdomen, nondistended, soft . No organomegaly or masses felt. Normal bowel sounds heard. Central nervous system: Alert and oriented. No focal neurological deficits.PERRL. full neuro exam per above negative. Extremities: Symmetric 5 x 5 power. Skin: No rashes, lesions or ulcers Psychiatry: appears to have significant anxiety, closing his eyes tightly shut when trying to examine his pupils for symmetry and reflex  Data Reviewed: I have personally reviewed following labs and imaging studies  CBC:  Recent Labs Lab 06/22/16 2050 06/22/16 2052 06/23/16 0116 06/23/16 0630  WBC 10.0  --  9.4 8.9  NEUTROABS 3.0  --   --   --   HGB 11.7* 12.2* 9.9* 10.0*  HCT 35.7* 36.0* 29.4* 30.5*  MCV 90.8  --  90.5 90.5  PLT 243  --  187 409   Basic Metabolic Panel:  Recent Labs Lab 06/22/16 2050 06/22/16 2052  NA 135 137  K 3.0* 5.6*  CL 102 107  CO2 24  --   GLUCOSE 167* 146*  BUN 18 24*  CREATININE 1.27* 1.20  CALCIUM 8.7*  --    Liver Function Tests:  Recent Labs Lab  06/22/16 2050  AST 18  ALT 16*  ALKPHOS 50  BILITOT 0.5  PROT 6.5  ALBUMIN 3.6    Recent Labs Lab 06/22/16 2050  LIPASE 33   Coagulation Profile:  Recent Labs Lab 06/22/16 2050  INR 0.97   Cardiac Enzymes:  Recent Labs Lab 06/22/16 2050  TROPONINI <0.03   CBG:  Recent Labs Lab 06/23/16 1234  GLUCAP 152*   Sepsis Labs:  Recent Labs Lab 06/22/16 2056 06/23/16 0900  LATICACIDVEN 2.76* 2.5*    Recent Results (from the past 240 hour(s))  Culture, blood (Routine x 2)     Status: None (  Preliminary result)   Collection Time: 06/22/16  9:08 PM  Result Value Ref Range Status   Specimen Description BLOOD LEFT ARM  Final   Special Requests BOTTLES DRAWN AEROBIC AND ANAEROBIC 6CC EACH  Final   Culture NO GROWTH < 12 HOURS  Final   Report Status PENDING  Incomplete  Culture, blood (Routine x 2)     Status: None (Preliminary result)   Collection Time: 06/22/16  9:25 PM  Result Value Ref Range Status   Specimen Description BLOOD RIGHT ARM  Final   Special Requests BOTTLES DRAWN AEROBIC AND ANAEROBIC 4CC EACH  Final   Culture NO GROWTH < 12 HOURS  Final   Report Status PENDING  Incomplete    Radiology Studies: Ct Abdomen Pelvis W Contrast  Result Date: 06/22/2016 CLINICAL DATA:  Acute onset of severe generalized abdominal pain. Bright red rectal bleeding. Diaphoresis. Initial encounter. EXAM: CT ABDOMEN AND PELVIS WITH CONTRAST TECHNIQUE: Multidetector CT imaging of the abdomen and pelvis was performed using the standard protocol following bolus administration of intravenous contrast. CONTRAST:  195mL ISOVUE-300 IOPAMIDOL (ISOVUE-300) INJECTION 61% COMPARISON:  CT of the abdomen and pelvis from 11/22/2014 FINDINGS: Lower chest: Minimal bibasilar atelectasis or scarring is noted. Scattered coronary artery calcifications are seen. Hepatobiliary: The liver is unremarkable in appearance. The gallbladder is unremarkable in appearance. The common bile duct remains normal in  caliber. Pancreas: There is mild dilatation of the pancreatic duct to 4-5 mm in maximal diameter, of uncertain significance. The pancreas is otherwise unremarkable. Spleen: The spleen is unremarkable in appearance. Adrenals/Urinary Tract: The adrenal glands are unremarkable in appearance. Small bilateral renal cysts are seen. Nonspecific perinephric stranding is noted bilaterally. There is no evidence of hydronephrosis. No renal or ureteral stones are identified. Stomach/Bowel: The stomach is unremarkable in appearance. The small bowel is within normal limits. The appendix is not visualized; there is no evidence for appendicitis. Mild diverticulosis is noted along the ascending colon. Mildly prominent vasculature is noted extending along the rectal wall, raising concern for internal hemorrhoids. Vascular/Lymphatic: Scattered calcification is seen along the abdominal aorta and its branches. Mild mural thrombus is seen along the common iliac arteries bilaterally. The abdominal aorta is otherwise grossly unremarkable. The inferior vena cava is grossly unremarkable. No retroperitoneal lymphadenopathy is seen. No pelvic sidewall lymphadenopathy is identified. Reproductive: The bladder is mildly distended and grossly unremarkable. The prostate is enlarged, measuring 5.3 cm in transverse dimension. Other: No additional soft tissue abnormalities are seen. Musculoskeletal: No acute osseous abnormalities are identified. Multilevel vacuum phenomenon is noted along the lumbar spine, with endplate sclerotic change. The visualized musculature is unremarkable in appearance. IMPRESSION: 1. Mildly prominent vasculature extending along the rectal wall, raising concern for internal hemorrhoids. 2. Mild dilatation of the pancreatic duct to 4-5 mm in maximal diameter, of uncertain significance. Pancreas otherwise unremarkable in appearance. This appears relatively stable from 2016 and may reflect the patient's baseline. 3. Scattered  bilateral renal cysts. 4. Mild diverticulosis along the ascending colon, without diverticulitis. 5. Scattered aortic atherosclerosis. 6. Enlarged prostate noted. 7. Degenerative change along the lumbar spine. Electronically Signed   By: Garald Balding M.D.   On: 06/22/2016 22:39   Dg Chest Port 1 View  Result Date: 06/22/2016 CLINICAL DATA:  Abdominal pain and rectal bleeding. EXAM: PORTABLE CHEST 1 VIEW COMPARISON:  Chest radiograph 11/15/2014 FINDINGS: Lower lung volumes from prior exam. Stable elevation of left hemidiaphragm. Heart size and mediastinal contours are unchanged, borderline cardiomegaly. No pulmonary edema, large pleural effusion or  pneumothorax. Stable osseous structures. Air within the stomach in the left upper quadrant. No obvious free air under the hemidiaphragm. Abdominal CT is planned. IMPRESSION: Borderline cardiomegaly.  Chronic elevation of left hemidiaphragm. Electronically Signed   By: Jeb Levering M.D.   On: 06/22/2016 21:24    Scheduled Meds: . sodium chloride   Intravenous Once  . feeding supplement  1 Container Oral TID BM  . levothyroxine  112 mcg Oral QAC breakfast  . omega-3 acid ethyl esters  2 g Oral Daily  . pantoprazole  40 mg Oral Daily  . pravastatin  20 mg Oral QHS  . sodium chloride  1,000 mL Intravenous Once  . sodium chloride flush  3 mL Intravenous Q12H   Continuous Infusions: . dextrose 5 % and 0.9% NaCl Stopped (06/23/16 1248)    LOS: 0 days   Bethena Roys, MD [facility] Pager [phone].  If 7PM-7AM, please contact night-coverage [email or web address] Password [email or web address] 06/23/2016, 1:23 PM

## 2016-06-23 NOTE — Progress Notes (Signed)
Patient has no visible injuries from fall. Will continue to assess his blood pressures and have placed patient on the bed alarm.

## 2016-06-23 NOTE — Progress Notes (Signed)
Patient developed left-sided numbness. He has been transferred to ICU for closer monitoring. He has not passed any more blood per rectum. Will postpone colonoscopy.

## 2016-06-23 NOTE — Progress Notes (Signed)
PAtient developed left side face and arm numbness and extreme anxiety. Called Code stroke and received verbal order for head CT from MD. Took patient to CT scan and then transferred to the ICU.

## 2016-06-23 NOTE — Progress Notes (Signed)
CODE STROKE In patient Beeper [id number] Exam began 4935 Exam finished 1601 16 08 exam completed n epic, inages sent to [facility], [employer] - spoke with zar

## 2016-06-23 NOTE — Consult Note (Signed)
Referring Provider:  Bethena Roys, MD  Primary Care Physician:  Pcp Not In System Primary Gastroenterologist:  Dr. Laural Golden  Reason for Consultation:    Rectal bleeding and anemia.  HPI:   Patient is 71 year old African-American male who was in usual state of felt when he woke up yesterday morning. He had a bowel movement and noted bright red blood with it. He had another BM about 9 hours later(around 3 PM) when he passed a large amount of blood per rectum. He was therefore brought to emergency room for evaluation. On presentation he was noted to be hypotensive but his blood pressure returned to normal with IV fluid challenge. His H&H was 11.7 and 35.7 and platelet count was normal at 243K. WBC was 10.0. He also was noted to be hypokalemic with serum potassium of 3.0. He and was normal at 18 and creatinine 1.27. Serum lactic acid level was mildly elevated at 0.76. He underwent abdominopelvic CT dated did not show changes of diverticulitis or colitis. Blood cultures are obtained and patient was admitted to ask less service for further management. Hemoglobin this morning was 9.9 g. Blood cultures have remained negative. He had bowel movement this morning when he passed small amount of blood per rectum. About an hour ago he had large bloody bowel movement. He states he became very apprehensive. As he walked back to his bed he passed out. He was found on the floor diaphoretic and with low blood pressure. He was begun on IV fluids and placed in Trendelenburg position and blood pressure returned to normal within few minutes. Patient states that he has noted mild pain in left lower quadrant over the last few days. [facility] was [facility] days ago and had blood work which was normal. He has not experienced fever or chills. He states he is never experienced rectal bleeding before. His last colonoscopy was October 2015 with removal of 2 small rectal polyps and these are hypoplastic. He also had  colonic diverticulosis. About 14 years ago he was diagnosed with peptic ulcer disease by Dr. Irving Shows secondary to NSAIDs. He therefore has not taken aspirin anymore. He states he may have lost 5 pounds over the last few weeks as he has not had a good appetite.   He is retired from [employer] where he worked for 29 years. He has never smoked cigarettes. He used is smokes cigars which he quit in 2008. He does not drink alcohol.  He is married and has one daughter living. She is 51 in good health. He has 3 stepchildren in good health. He lost one son of brain cancer at age 66. Both parents died of MI. Father was 10 and mother 38. One brother died of prostate carcinoma at age 54. Another brother died of brain tumor age 16. He has one brother living who is 10 and he has diabetes mellitus. He has 2 sisters in good health ages 38 and 38. One sister died when she was a baby. He states heartburn is well controlled with PPI and he denies dysphagia.   Past Medical History:  Diagnosis Date  . DDD (degenerative disc disease), lumbar   . Diverticula of colon   . Iliac artery aneurysm, bilateral (HCC)    with significant stenosis of right internal iliac artery  . Peptic ulcer   . Prostatitis   . 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 Last EGD with biopsy  was in    October 2015.  Past Surgical History:  Procedure Laterality Date  . COLONOSCOPY  April 2005   Dr. Irving Shows. Small internal hemorrhoids. Multiple small scattered diverticula in the cecum and descending colon.  . COLONOSCOPY N/A 01/18/2014   Procedure: COLONOSCOPY;  Surgeon: Daneil Dolin, MD;  Location: [facility];  Service: Endoscopy;  Laterality: N/A;  1100  . COLONOSCOPY WITH ESOPHAGOGASTRODUODENOSCOPY (EGD) N/A 12/16/2012   Dr. Gala Romney: rectal and colonic polyps with inadequate preparation, tubular adenoma. EGD with biopsy proven short segment Barrett's esophagus, hiatal hernia, mild  chronic inactive gastritis   . ESOPHAGOGASTRODUODENOSCOPY  July 2005   Dr. Irving Shows grade 2 esophagitis at the GE junction. Acute gastritis. Multiple acute active, deep, irregular, friable ulcers in the antrum and body of the stomach. 2 active, deep, friable, irregular ulcers in the duodenum. Pathology not available.  . ESOPHAGOGASTRODUODENOSCOPY N/A 01/18/2014   Procedure: ESOPHAGOGASTRODUODENOSCOPY (EGD);  Surgeon: Daneil Dolin, MD;  Location: [facility];  Service: Endoscopy;  Laterality: N/A;  . THYROIDECTOMY    . TONSILLECTOMY      Prior to Admission medications   Medication Sig Start Date End Date Taking? Authorizing Provider  fish oil-omega-3 fatty acids 1000 MG capsule Take 2 g by mouth daily.   Yes Historical Provider, MD  levothyroxine (SYNTHROID, LEVOTHROID) 112 MCG tablet Take 112 mcg by mouth every morning.   Yes Historical Provider, MD  Multiple Vitamin (MULTIVITAMIN WITH MINERALS) TABS tablet Take 1 tablet by mouth daily.   Yes Historical Provider, MD  omeprazole (PRILOSEC) 20 MG capsule TAKE 1 CAPSULE EVERY DAY 12/27/15  Yes Mahala Menghini, PA-C  pravastatin (PRAVACHOL) 20 MG tablet Take 20 mg by mouth at bedtime.   Yes Historical Provider, MD    Current Facility-Administered Medications  Medication Dose Route Frequency Provider Last Rate Last Dose  . 0.9 %  sodium chloride infusion   Intravenous Once Ejiroghene E Emokpae, MD      . 0.9 %  sodium chloride infusion   Intravenous Continuous Ejiroghene Arlyce Dice, MD 125 mL/hr at 06/23/16 1350    . dextrose 5 %-0.9 % sodium chloride infusion   Intravenous Continuous Orvan Falconer, MD   Stopped at 06/23/16 1248  . feeding supplement (BOOST / RESOURCE BREEZE) liquid 1 Container  1 Container Oral TID BM Orvan Falconer, MD   1 Container at 06/23/16 0919  . levothyroxine (SYNTHROID, LEVOTHROID) tablet 112 mcg  112 mcg Oral QAC breakfast Orvan Falconer, MD   112 mcg at 06/23/16 0919  . omega-3 acid ethyl esters (LOVAZA) capsule 2 g  2 g Oral  Daily Orvan Falconer, MD   2 g at 06/23/16 0919  . pantoprazole (PROTONIX) EC tablet 40 mg  40 mg Oral Daily Orvan Falconer, MD   40 mg at 06/23/16 0920  . polyethylene glycol-electrolytes (NuLYTELY/GoLYTELY) solution 4,000 mL  4,000 mL Oral Once Rogene Houston, MD      . pravastatin (PRAVACHOL) tablet 20 mg  20 mg Oral QHS Orvan Falconer, MD      . sodium chloride 0.9 % bolus 1,000 mL  1,000 mL Intravenous Once Orvan Falconer, MD      . sodium chloride flush (NS) 0.9 % injection 3 mL  3 mL Intravenous Q12H Orvan Falconer, MD   3 mL at 06/23/16 1000    Allergies as of 06/22/2016  . (No Known Allergies)    Family History  Problem Relation Age of Onset  . Hypertension Mother   .  Heart disease Mother     before age 44  . Heart disease Father     before age 44  . Hyperlipidemia Sister   . Cancer Sister   . Colon cancer Neg Hx   . Liver disease Neg Hx     Social History   Social History  . Marital status: Married    Spouse name: N/A  . Number of children: N/A  . Years of education: N/A   Occupational History  . Not on file.   Social History Main Topics  . Smoking status: Former Smoker    Years: 25.00    Types: Cigars    Quit date: 11/27/2006  . Smokeless tobacco: Never Used     Comment: Quit in 2009  . Alcohol use No     Comment: former. weekend drinker (liqour) before, May 2015 last time he drank during a Therapist, nutritional.   . Drug use: No  . Sexual activity: Not on file   Other Topics Concern  . Not on file   Social History Narrative   1 deceased child. 1 living    Review of Systems: See HPI, otherwise normal ROS  Physical Exam: Temp:  [97.1 F (36.2 C)-98.8 F (37.1 C)] 98.1 F (36.7 C) (04/01 1252) Pulse Rate:  [73-105] 97 (04/01 1330) Resp:  [14-23] 16 (04/01 1330) BP: (77-134)/(54-77) 134/73 (04/01 1330) SpO2:  [97 %-100 %] 100 % (04/01 1330) Weight:  [175 lb 11.3 oz (79.7 kg)] 175 lb 11.3 oz (79.7 kg) (04/01 0017) Last BM Date: 06/23/16  Patient is alert and in no acute  distress. Conjunctiva was sclerae nonicteric. He has upper and lower dentures in place. No neck masses or thyromegaly noted. Cardiac exam with regular rhythm normal S1 and S2. No murmur or gallop noted. Lungs are clear to auscultation. Abdomen is full but symmetrical. Bowel sounds are hyperactive. On palpation abdomen is soft with mild peri-umbilical tenderness. No organomegaly or masses. No peripheral edema or clubbing noted.   Lab Results:  Recent Labs  06/23/16 0116 06/23/16 0630 06/23/16 1307  WBC 9.4 8.9 10.4  HGB 9.9* 10.0* 8.5*  HCT 29.4* 30.5* 25.9*  PLT 187 230 198   BMET  Recent Labs  06/22/16 2050 06/22/16 2052 06/23/16 1307  NA 135 137 139  K 3.0* 5.6* 3.5  CL 102 107 111  CO2 24  --  23  GLUCOSE 167* 146* 148*  BUN 18 24* 13  CREATININE 1.27* 1.20 0.83  CALCIUM 8.7*  --  8.1*   LFT  Recent Labs  06/23/16 1307  PROT 5.2*  ALBUMIN 2.9*  AST 20  ALT 13*  ALKPHOS 38  BILITOT 0.7   PT/INR  Recent Labs  06/22/16 2050  LABPROT 12.8  INR 0.97   Hepatitis Panel No results for input(s): HEPBSAG, HCVAB, HEPAIGM, HEPBIGM in the last 72 hours.  Studies/Results: Ct Abdomen Pelvis W Contrast  Result Date: 06/22/2016 CLINICAL DATA:  Acute onset of severe generalized abdominal pain. Bright red rectal bleeding. Diaphoresis. Initial encounter. EXAM: CT ABDOMEN AND PELVIS WITH CONTRAST TECHNIQUE: Multidetector CT imaging of the abdomen and pelvis was performed using the standard protocol following bolus administration of intravenous contrast. CONTRAST:  121mL ISOVUE-300 IOPAMIDOL (ISOVUE-300) INJECTION 61% COMPARISON:  CT of the abdomen and pelvis from 11/22/2014 FINDINGS: Lower chest: Minimal bibasilar atelectasis or scarring is noted. Scattered coronary artery calcifications are seen. Hepatobiliary: The liver is unremarkable in appearance. The gallbladder is unremarkable in appearance. The common bile duct remains normal in  caliber. Pancreas: There is mild  dilatation of the pancreatic duct to 4-5 mm in maximal diameter, of uncertain significance. The pancreas is otherwise unremarkable. Spleen: The spleen is unremarkable in appearance. Adrenals/Urinary Tract: The adrenal glands are unremarkable in appearance. Small bilateral renal cysts are seen. Nonspecific perinephric stranding is noted bilaterally. There is no evidence of hydronephrosis. No renal or ureteral stones are identified. Stomach/Bowel: The stomach is unremarkable in appearance. The small bowel is within normal limits. The appendix is not visualized; there is no evidence for appendicitis. Mild diverticulosis is noted along the ascending colon. Mildly prominent vasculature is noted extending along the rectal wall, raising concern for internal hemorrhoids. Vascular/Lymphatic: Scattered calcification is seen along the abdominal aorta and its branches. Mild mural thrombus is seen along the common iliac arteries bilaterally. The abdominal aorta is otherwise grossly unremarkable. The inferior vena cava is grossly unremarkable. No retroperitoneal lymphadenopathy is seen. No pelvic sidewall lymphadenopathy is identified. Reproductive: The bladder is mildly distended and grossly unremarkable. The prostate is enlarged, measuring 5.3 cm in transverse dimension. Other: No additional soft tissue abnormalities are seen. Musculoskeletal: No acute osseous abnormalities are identified. Multilevel vacuum phenomenon is noted along the lumbar spine, with endplate sclerotic change. The visualized musculature is unremarkable in appearance. IMPRESSION: 1. Mildly prominent vasculature extending along the rectal wall, raising concern for internal hemorrhoids. 2. Mild dilatation of the pancreatic duct to 4-5 mm in maximal diameter, of uncertain significance. Pancreas otherwise unremarkable in appearance. This appears relatively stable from 2016 and may reflect the patient's baseline. 3. Scattered bilateral renal cysts. 4. Mild  diverticulosis along the ascending colon, without diverticulitis. 5. Scattered aortic atherosclerosis. 6. Enlarged prostate noted. 7. Degenerative change along the lumbar spine. Electronically Signed   By: Garald Balding M.D.   On: 06/22/2016 22:39   Dg Chest Port 1 View  Result Date: 06/22/2016 CLINICAL DATA:  Abdominal pain and rectal bleeding. EXAM: PORTABLE CHEST 1 VIEW COMPARISON:  Chest radiograph 11/15/2014 FINDINGS: Lower lung volumes from prior exam. Stable elevation of left hemidiaphragm. Heart size and mediastinal contours are unchanged, borderline cardiomegaly. No pulmonary edema, large pleural effusion or pneumothorax. Stable osseous structures. Air within the stomach in the left upper quadrant. No obvious free air under the hemidiaphragm. Abdominal CT is planned. IMPRESSION: Borderline cardiomegaly.  Chronic elevation of left hemidiaphragm. Electronically Signed   By: Jeb Levering M.D.   On: 06/22/2016 21:24  Imaging studies reviewed along with CT from 10/16/2012.  Assessment;  Painless large-volume hematochezia possibly secondary to clonic diverticulosis. Last colonoscopy was in October 2015 he also had 2 small rectal polyps removed and these are hypoplastic. Patient experienced syncope about an hour ago after passing large amount of blood per rectum. Suspect vasovagal syncope.  Anemia secondary to GI bleed. H&H from this afternoon is pending.  Patient also noted to have mildly elevated lactic acid level on admission but abdominopelvic CT did not show changes of colitis or diverticulitis.  Mildly dilated pancreatic duct doubt mass or calcification. Dilated pancreatic duct also noted on study of July 2014 therefore not worrisome.  Recommendations;  Diagnostic colonoscopy later today following GoLYTELY prep. Consider transfusion if hemoglobin drops any further. Procedure and risks reviewed with the patient and he is agreeable.   LOS: 0 days   Najeeb Rehman  06/23/2016, 2:05  PM

## 2016-06-24 ENCOUNTER — Inpatient Hospital Stay (HOSPITAL_COMMUNITY): Payer: Medicare HMO | Admitting: Anesthesiology

## 2016-06-24 ENCOUNTER — Encounter (HOSPITAL_COMMUNITY)
Admission: EM | Disposition: A | Discharge status: Home / Self Care | Payer: Self-pay | Source: Home / Self Care | Attending: Internal Medicine

## 2016-06-24 ENCOUNTER — Encounter (HOSPITAL_COMMUNITY): Payer: Self-pay | Admitting: Gastroenterology

## 2016-06-24 DIAGNOSIS — K922 Gastrointestinal hemorrhage, unspecified: Secondary | ICD-10-CM

## 2016-06-24 DIAGNOSIS — D120 Benign neoplasm of cecum: Secondary | ICD-10-CM

## 2016-06-24 HISTORY — PX: POLYPECTOMY: SHX5525

## 2016-06-24 HISTORY — PX: COLONOSCOPY WITH PROPOFOL: SHX5780

## 2016-06-24 LAB — LIPID PANEL
CHOLESTEROL: 99 mg/dL (ref 0–200)
HDL: 15 mg/dL — ABNORMAL LOW (ref >40)
LDL Cholesterol: 49 mg/dL (ref 0–99)
Total CHOL/HDL Ratio: 6.6 RATIO
Triglycerides: 175 mg/dL — ABNORMAL HIGH (ref <150)
VLDL: 35 mg/dL (ref 0–40)

## 2016-06-24 LAB — CBC
HCT: 21.2 % — ABNORMAL LOW (ref 39.0–52.0)
HCT: 23.8 % — ABNORMAL LOW (ref 39.0–52.0)
HCT: 26.4 % — ABNORMAL LOW (ref 39.0–52.0)
HEMOGLOBIN: 8.1 g/dL — AB (ref 13.0–17.0)
HEMOGLOBIN: 9.2 g/dL — AB (ref 13.0–17.0)
Hemoglobin: 7.1 g/dL — ABNORMAL LOW (ref 13.0–17.0)
MCH: 29.6 pg (ref 26.0–34.0)
MCH: 29.9 pg (ref 26.0–34.0)
MCH: 30.1 pg (ref 26.0–34.0)
MCHC: 33.5 g/dL (ref 30.0–36.0)
MCHC: 34 g/dL (ref 30.0–36.0)
MCHC: 34.8 g/dL (ref 30.0–36.0)
MCV: 88.3 fL (ref 78.0–100.0)
MCV: 87.8 fL (ref 78.0–100.0)
MCV: 86.3 fL (ref 78.0–100.0)
PLATELETS: 181 10*3/uL (ref 150–400)
Platelets: 146 10*3/uL — ABNORMAL LOW (ref 150–400)
Platelets: 136 10*3/uL — ABNORMAL LOW (ref 150–400)
RBC: 2.4 MIL/uL — AB (ref 4.22–5.81)
RBC: 2.71 MIL/uL — ABNORMAL LOW (ref 4.22–5.81)
RBC: 3.06 MIL/uL — AB (ref 4.22–5.81)
RDW: 15.5 % (ref 11.5–15.5)
RDW: 15.4 % (ref 11.5–15.5)
RDW: 14.8 % (ref 11.5–15.5)
WBC: 10.9 10*3/uL — ABNORMAL HIGH (ref 4.0–10.5)
WBC: 13.4 10*3/uL — AB (ref 4.0–10.5)
WBC: 13.7 10*3/uL — AB (ref 4.0–10.5)

## 2016-06-24 LAB — BASIC METABOLIC PANEL
Anion gap: 3 — ABNORMAL LOW (ref 5–15)
BUN: 10 mg/dL (ref 6–20)
CALCIUM: 7.8 mg/dL — AB (ref 8.9–10.3)
CO2: 23 mmol/L (ref 22–32)
CREATININE: 0.71 mg/dL (ref 0.61–1.24)
Chloride: 111 mmol/L (ref 101–111)
GFR calc Af Amer: >60 mL/min (ref >60)
GLUCOSE: 128 mg/dL — AB (ref 65–99)
POTASSIUM: 3.7 mmol/L (ref 3.5–5.1)
SODIUM: 137 mmol/L (ref 135–145)

## 2016-06-24 LAB — GLUCOSE, CAPILLARY: GLUCOSE-CAPILLARY: 139 mg/dL — AB (ref 65–99)

## 2016-06-24 LAB — PREPARE RBC (CROSSMATCH)

## 2016-06-24 LAB — MRSA PCR SCREENING: MRSA BY PCR: NEGATIVE

## 2016-06-24 SURGERY — COLONOSCOPY WITH PROPOFOL
Anesthesia: Monitor Anesthesia Care

## 2016-06-24 MED ORDER — PROPOFOL 10 MG/ML IV BOLUS
INTRAVENOUS | Status: AC
  Filled 2016-06-24: qty 40

## 2016-06-24 MED ORDER — SODIUM CHLORIDE 0.9 % IV SOLN
Freq: Once | INTRAVENOUS | Status: AC
  Administered 2016-06-25: 100 mL via INTRAVENOUS

## 2016-06-24 MED ORDER — FENTANYL CITRATE (PF) 100 MCG/2ML IJ SOLN
25.0000 ug | Freq: Once | INTRAMUSCULAR | Status: AC
  Administered 2016-06-24: 25 ug via INTRAVENOUS

## 2016-06-24 MED ORDER — SODIUM CHLORIDE 0.9 % IV BOLUS (SEPSIS)
500.0000 mL | Freq: Once | INTRAVENOUS | Status: AC
  Administered 2016-06-24: 500 mL via INTRAVENOUS

## 2016-06-24 MED ORDER — FENTANYL CITRATE (PF) 100 MCG/2ML IJ SOLN
INTRAMUSCULAR | Status: AC
  Filled 2016-06-24: qty 2

## 2016-06-24 MED ORDER — SODIUM CHLORIDE 0.9 % IV SOLN
Freq: Once | INTRAVENOUS | Status: AC
  Administered 2016-06-24: 08:00:00 via INTRAVENOUS

## 2016-06-24 MED ORDER — PROPOFOL 10 MG/ML IV BOLUS
INTRAVENOUS | Status: DC | PRN
  Administered 2016-06-24: 10 mg via INTRAVENOUS
  Administered 2016-06-24: 20 mg via INTRAVENOUS
  Administered 2016-06-24: 10 mg via INTRAVENOUS

## 2016-06-24 MED ORDER — PROPOFOL 500 MG/50ML IV EMUL
INTRAVENOUS | Status: DC | PRN
  Administered 2016-06-24: 75 ug/kg/min via INTRAVENOUS
  Administered 2016-06-24: 12:00:00 via INTRAVENOUS

## 2016-06-24 MED ORDER — MIDAZOLAM HCL 2 MG/2ML IJ SOLN
1.0000 mg | INTRAMUSCULAR | Status: AC
  Administered 2016-06-24: 2 mg via INTRAVENOUS

## 2016-06-24 MED ORDER — ACETAMINOPHEN 325 MG PO TABS
650.0000 mg | ORAL_TABLET | Freq: Once | ORAL | Status: AC
  Administered 2016-06-24: 650 mg via ORAL
  Filled 2016-06-24: qty 2

## 2016-06-24 MED ORDER — MIDAZOLAM HCL 2 MG/2ML IJ SOLN
INTRAMUSCULAR | Status: AC
  Filled 2016-06-24: qty 2

## 2016-06-24 MED ORDER — DIPHENHYDRAMINE HCL 25 MG PO CAPS
25.0000 mg | ORAL_CAPSULE | Freq: Once | ORAL | Status: AC
  Administered 2016-06-24: 25 mg via ORAL
  Filled 2016-06-24: qty 1

## 2016-06-24 MED ORDER — LACTATED RINGERS IV SOLN: INTRAVENOUS | Status: DC

## 2016-06-24 MED ORDER — SODIUM CHLORIDE 0.9 % IV SOLN: INTRAVENOUS | Status: DC

## 2016-06-24 NOTE — Progress Notes (Signed)
Pt has had 2 large bloody bowel movements. Awaiting CBC results this AM

## 2016-06-24 NOTE — Interval H&P Note (Signed)
History and Physical Interval Note:  06/24/2016 11:19 AM  Sean Leonard  has presented today for surgery, with the diagnosis of lower GI bleed, acute blood loss anemia  The various methods of treatment have been discussed with the patient and family. After consideration of risks, benefits and other options for treatment, the patient has consented to  Procedure(s): COLONOSCOPY WITH PROPOFOL (N/A) as a surgical intervention .  The patient's history has been reviewed, patient examined, no change in status, stable for surgery.  I have reviewed the patient's chart and labs.  Questions were answered to the patient's satisfaction.     [employer]

## 2016-06-24 NOTE — Progress Notes (Signed)
PT Cancellation Note  Patient Details Name: Sean Leonard MRN: [id number] DOB: Jun 09, 1945   Cancelled Treatment:    Reason Eval/Treat Not Completed: Patient not medically ready (Pt chart reviewed.  Pt underwent a colonscopy with snare polypectomy and is currently receiving transfusion of PRBC's.  Will check back tomorrow.  )   Beth Earsie Humm, PT, DPT X: [phone]

## 2016-06-24 NOTE — Transfer of Care (Signed)
Immediate Anesthesia Transfer of Care Note  Patient: Sean Leonard  Procedure(s) Performed: Procedure(s) with comments: COLONOSCOPY WITH PROPOFOL (N/A) POLYPECTOMY - cecal  Patient Location: PACU  Anesthesia Type:MAC  Level of Consciousness: sedated  Airway & Oxygen Therapy: Patient Spontanous Breathing and Patient connected to face mask oxygen  Post-op Assessment: Report given to RN  Post vital signs: Reviewed and stable  Last Vitals:  Vitals:   06/24/16 1115 06/24/16 1120  BP: 118/74 (!) 142/82  Pulse:    Resp: 17 (!) 21  Temp:      Last Pain:  Vitals:   06/24/16 1054  TempSrc: Oral  PainSc:       Patients Stated Pain Goal: 10 (79/39/68 [id number])  Complications: No apparent anesthesia complications

## 2016-06-24 NOTE — Anesthesia Postprocedure Evaluation (Signed)
Anesthesia Post Note  Patient: Sean Leonard  Procedure(s) Performed: Procedure(s) (LRB): COLONOSCOPY WITH PROPOFOL (N/A) POLYPECTOMY  Patient location during evaluation: PACU Anesthesia Type: MAC Level of consciousness: awake and alert Pain management: pain level controlled Vital Signs Assessment: post-procedure vital signs reviewed and stable Respiratory status: spontaneous breathing Cardiovascular status: blood pressure returned to baseline Postop Assessment: no signs of nausea or vomiting Anesthetic complications: no     Last Vitals:  Vitals:   06/24/16 1215 06/24/16 1230  BP:  104/74  Pulse: 97 88  Resp: 16 12  Temp:      Last Pain:  Vitals:   06/24/16 1210  TempSrc:   PainSc: Asleep                 Laterica Matarazzo

## 2016-06-24 NOTE — Op Note (Signed)
[facility] Patient Name: Sean Leonard Procedure Date: 06/24/2016 10:41 AM MRN: [id number] Date of Birth: [birth year] Attending MD: Barney Drain , MD CSN: [id number] Age: 71 Admit Type: Inpatient Procedure:                Colonoscopy WITH SNARE POLYPECTOMY Indications:              Hematochezia Providers:                Barney Drain, MD, Lurline Del, RN, Aram Candela Referring MD:             Norvel Richards, MD, Barrie Folk. Hill MD, MD Medicines:                Propofol per Anesthesia Complications:            No immediate complications. Estimated Blood Loss:     Estimated blood loss: none. Procedure:                Pre-Anesthesia Assessment:                           - Prior to the procedure, a History and Physical                            was performed, and patient medications and                            allergies were reviewed. The patient's tolerance of                            previous anesthesia was also reviewed. The risks                            and benefits of the procedure and the sedation                            options and risks were discussed with the patient.                            All questions were answered, and informed consent                            was obtained. Prior Anticoagulants: The patient has                            taken no previous anticoagulant or antiplatelet                            agents. ASA Grade Assessment: III - A patient with                            severe systemic disease. After reviewing the risks                            and benefits, the patient was deemed in  satisfactory condition to undergo the procedure.                            After obtaining informed consent, the colonoscope                            was passed under direct vision. Throughout the                            procedure, the patient's blood pressure, pulse, and                            oxygen saturations  were monitored continuously. The                            EC-[id number] ([id number]) scope was introduced through                            the anus and advanced to the 5 cm into the ileum.                            The colonoscopy was somewhat difficult due to a                            tortuous colon. Successful completion of the                            procedure was aided by straightening and shortening                            the scope to obtain bowel loop reduction and                            COLOWRAP. The patient tolerated the procedure                            fairly well. The terminal ileum, ileocecal valve,                            appendiceal orifice, and rectum were photographed.                            The quality of the bowel preparation was good. Scope In: 11:37:36 AM Scope Out: 11:57:30 AM Scope Withdrawal Time: 0 hours 14 minutes 10 seconds  Total Procedure Duration: 0 hours 19 minutes 54 seconds  Findings:      The terminal ileum contained red blood.      A 6 mm polyp was found in the cecum. The polyp was sessile. The polyp       was removed with a hot snare. Resection and retrieval were complete.      The recto-sigmoid colon, sigmoid colon and descending colon were       moderately redundant.      Multiple small and large-mouthed diverticula were found in the ascending  colon.      External and internal hemorrhoids were found during retroflexion. Impression:               - RECTAL BLEEDING MOST LIKELY DUE TO ASCENDING                            COLON DIVERTICULOSIS                           - One 6 mm polyp in the cecum, removed with a hot                            snare. Resected and retrieved.                           - Redundant LEFT colon.                           - External and internal hemorrhoids. Moderate Sedation:      Per Anesthesia Care Recommendation:           - Repeat colonoscopy in 5-10 years for surveillance.                            - Soft diet and low fat diet.                           - Continue present medications.                           - Await pathology results.                           -IF PT CONTINUES TO BLEED WILL NEED TAGGED RBC SCAN                            +/- ANGIOGRAPHY/EMBOLIZATION                           - Return patient to hospital ward for ongoing care. Procedure Code(s):        --- Professional ---                           [phone], Colonoscopy, flexible; with removal of                            tumor(s), polyp(s), or other lesion(s) by snare                            technique Diagnosis Code(s):        --- Professional ---                           K92.2, Gastrointestinal hemorrhage, unspecified                           D12.0, Benign neoplasm of cecum  K64.8, Other hemorrhoids                           K92.1, Melena (includes Hematochezia)                           K57.30, Diverticulosis of large intestine without                            perforation or abscess without bleeding                           Q43.8, Other specified congenital malformations of                            intestine CPT copyright 2016 [employer] Medical [employer]. All rights reserved. The codes documented in this report are preliminary and upon coder review may  be revised to meet current compliance requirements. Barney Drain, MD Barney Drain, MD 06/24/2016 12:16:59 PM This report has been signed electronically. Number of Addenda: 0

## 2016-06-24 NOTE — Progress Notes (Signed)
Pt has had 5 large bloody bowel movements this morning. Pt was c/o severe pain to abdomen and asked to get on bedpan. Placed pt on bedpan and he passes several large clots and seemingly vagaled down and passed out for a few seconds. Pt did come to and was alert and oriented but has since been lethargic. Will awaken and responds appropirately. Spoke with Dr. Laural Golden and started a 500cc blous as well pre blood medications and an order to transfuse 2 more units of PRBC. Call Pts wife and she is on the way in.

## 2016-06-24 NOTE — Anesthesia Preprocedure Evaluation (Addendum)
Anesthesia Evaluation  Patient identified by MRN, date of birth, ID band Patient awake    Reviewed: Allergy & Precautions, NPO status , Patient's Chart, lab work & pertinent test results  Airway Mallampati: II  TM Distance: >3 FB Neck ROM: Full    Dental  (+) Edentulous Upper, Edentulous Lower   Pulmonary former smoker,    breath sounds clear to auscultation       Cardiovascular + Peripheral Vascular Disease ( Iliac artery aneurysm, bilateral )   Rhythm:Regular Rate:Tachycardia  Hypotension and syncope yesterday, and near syncope this am prior to transfusion.   Neuro/Psych    GI/Hepatic PUD, Lower GI bleed down Hb 7.1. Transfusing 2 PRBC now.   Endo/Other  Hypothyroidism   Renal/GU      Musculoskeletal   Abdominal   Peds  Hematology   Anesthesia Other Findings   Reproductive/Obstetrics                            Anesthesia Physical Anesthesia Plan  ASA: III  Anesthesia Plan: MAC   Post-op Pain Management:    Induction: Intravenous  Airway Management Planned: Simple Face Mask  Additional Equipment:   Intra-op Plan:   Post-operative Plan:   Informed Consent: I have reviewed the patients History and Physical, chart, labs and discussed the procedure including the risks, benefits and alternatives for the proposed anesthesia with the patient or authorized representative who has indicated his/her understanding and acceptance.     Plan Discussed with:   Anesthesia Plan Comments:         Anesthesia Quick Evaluation

## 2016-06-24 NOTE — Progress Notes (Signed)
PROGRESS NOTE    Sean Leonard  ZOX:[id number] DOB: 14-May-1945 DOA: 06/22/2016 PCP: Pcp Not In System   Brief Narrative:  Sean Leonard is an 71 y.o. male with hx of known diverticulosis with colonoscopy by Dr Felipe Drone 3 years ago, hx of Barretts' s/p EGD same time, DDD, hypothyrodism, bilateral iliac artery aneurysm, presented to the ER with BRBPR.  He had abdominal discomfort a couple of days ago, but has none now.  No black stool or epigastric pain.   He denied using NSAIDS, and had not been on ASA due to his prior hx of PUD.  He did feel lightheaded, and on presentation in the ER, he was hypotensive, but responded to IVF.  Hb was found to be at 12.2g per dl, normal BUN, and normal INR.  He has normal renal fx tests.  His BP on presentation was in the 70's, and now 110 without significant IVF.  A CT of the abd pelvis was done showing showing enlarge pancreatic duct, suspicious for chronic enlargement, and diverticulosis, with internal hemorrhoid.  06/23/16- 2 rapid response pages overhead ,1st one- patient was found on the floor after having a large bloody bowel movement, 2nd page- patient reported numbness in his left upper extremity , so code stroke was called, no Weakness or symptoms to suggest CVA. Stat head CT done negative for intra-cranial hemorrhage considering patient was on the floor though he was sure he did not hit his head as well was conscious the whole time, and aware.   Assessment & Plan:   Principal Problem:   Lower GI bleed Active Problems:   Barrett's esophagus  LGIB: Several multiple bloody bowel movements. Has had total of 4 units of transfusion this admission.  - CBC Q8H, and give IVF with bolus.  - Lactic acidosis resolved - GI recs appreciated - Transfer to step down, ongoing GI bleed - Changed obs to inpatient - Endoscopy today by Dr. Oneida Alar- Polyp, diverticulosis, . A further bleeding will need adequate blood cell scan/angiography, embolization.   Syncopal  episode, Hypotension:  several vasovagal episodes after bowel movement with resultant syncopal attack. Hgb has been stable.  - goal hgb >8.  Left upper extremity numbness- resolved, head CT neg for intracranial bleed, thorough neuro exam nonfocal, yesterday and today. As likely related to vasovagal activity, anxiety, syncopal attack.  - No further stroke workup - Lipid panel a.m, hemoglobin A1c, PT OT eval  Hx of Barretts and PUD:  No NSAIDS use and no ASA.  No evidence of UGIB.  DVT prophylaxis:  SCDs  Code Status: Full Family Communication: Wife present at bedside earlier in the day. Also communicated with her later today about husbands syncopal and CVA-like symptoms.  Disposition Plan: Home  Consultants:   Gastroenterology  Procedures:   Antimicrobials: None   Subjective: Abdominal pain, several bloody bowel movements, with clots.   Objective: Vitals:   06/24/16 1500 06/24/16 1555 06/24/16 1600 06/24/16 1700  BP: 110/89  127/70 116/67  Pulse: 92 91 89 95  Resp: 20 (!) 21 19 13   Temp:  99 F (37.2 C)    TempSrc:  Oral    SpO2: 99% 100% 100% 99%  Weight:      Height:        Intake/Output Summary (Last 24 hours) at 06/24/16 1732 Last data filed at 06/24/16 1600  Gross per 24 hour  Intake          2223.66 ml  Output  1400 ml  Net           823.66 ml   Filed Weights   06/23/16 1647 06/24/16 0400 06/24/16 1048  Weight: 80.9 kg (178 lb 5.6 oz) 78.8 kg (173 lb 11.6 oz) 78.5 kg (173 lb)    Examination:  General exam: Awake ,appears weak. Respiratory system: Clear to auscultation. Respiratory effort normal. Cardiovascular system: S1 & S2 heard, RRR. No JVD, murmurs, rubs, gallops or clicks. No pedal edema. Gastrointestinal system: Tender lower abdomen, nondistended, soft . No organomegaly or masses felt. Normal bowel sounds heard. Central nervous system: Alert and oriented. No focal neurological deficits.PERRL. Strength 5 over 5 all extremities sensation  intact. Cr N 2-12 intact Extremities: Symmetric 5 x 5 power. Skin: No rashes, lesions or ulcers Psychiatry: appears to have significant anxiety, closing his eyes tightly shut when trying to examine his pupils for symmetry and reflex  Data Reviewed: I have personally reviewed following labs and imaging studies  CBC:  Recent Labs Lab 06/22/16 2050  06/23/16 0630 06/23/16 1307 06/23/16 2200 06/24/16 0423 06/24/16 1226  WBC 10.0  < > 8.9 10.4 9.8 10.9* 13.4*  NEUTROABS 3.0  --   --   --   --   --   --   HGB 11.7*  < > 10.0* 8.5* 8.0* 7.1* 8.1*  HCT 35.7*  < > 30.5* 25.9* 23.8* 21.2* 23.8*  MCV 90.8  < > 90.5 89.9 88.8 88.3 87.8  PLT 243  < > 230 198 164 181 146*  < > = values in this interval not displayed. Basic Metabolic Panel:  Recent Labs Lab 06/22/16 2050 06/22/16 2052 06/23/16 1307 06/24/16 0423  NA 135 137 139 137  K 3.0* 5.6* 3.5 3.7  CL 102 107 111 111  CO2 24  --  23 23  GLUCOSE 167* 146* 148* 128*  BUN 18 24* 13 10  CREATININE 1.27* 1.20 0.83 0.71  CALCIUM 8.7*  --  8.1* 7.8*  MG  --   --  1.8  --    Liver Function Tests:  Recent Labs Lab 06/22/16 2050 06/23/16 1307  AST 18 20  ALT 16* 13*  ALKPHOS 50 38  BILITOT 0.5 0.7  PROT 6.5 5.2*  ALBUMIN 3.6 2.9*    Recent Labs Lab 06/22/16 2050  LIPASE 33   Coagulation Profile:  Recent Labs Lab 06/22/16 2050  INR 0.97   Cardiac Enzymes:  Recent Labs Lab 06/22/16 2050 06/23/16 1307  TROPONINI <0.03 <0.03   CBG:  Recent Labs Lab 06/23/16 1234 06/24/16 0635  GLUCAP 152* 139*   Sepsis Labs:  Recent Labs Lab 06/22/16 2056 06/23/16 0900 06/23/16 1500  LATICACIDVEN 2.76* 2.5* 1.9    Recent Results (from the past 240 hour(s))  Culture, blood (Routine x 2)     Status: None (Preliminary result)   Collection Time: 06/22/16  9:08 PM  Result Value Ref Range Status   Specimen Description BLOOD LEFT ARM  Final   Special Requests BOTTLES DRAWN AEROBIC AND ANAEROBIC 6CC EACH  Final    Culture NO GROWTH 2 DAYS  Final   Report Status PENDING  Incomplete  Culture, blood (Routine x 2)     Status: None (Preliminary result)   Collection Time: 06/22/16  9:25 PM  Result Value Ref Range Status   Specimen Description BLOOD RIGHT ARM  Final   Special Requests BOTTLES DRAWN AEROBIC AND ANAEROBIC 4CC EACH  Final   Culture NO GROWTH 2 DAYS  Final   Report  Status PENDING  Incomplete  Culture, blood (routine x 2)     Status: None (Preliminary result)   Collection Time: 06/23/16  1:08 PM  Result Value Ref Range Status   Specimen Description BLOOD  Final   Special Requests   Final    Blood Culture results may not be optimal due to an inadequate volume of blood received in culture bottles   Culture NO GROWTH < 24 HOURS  Final   Report Status PENDING  Incomplete  Culture, blood (routine x 2)     Status: None (Preliminary result)   Collection Time: 06/23/16  1:09 PM  Result Value Ref Range Status   Specimen Description BLOOD  Final   Special Requests Blood Culture adequate volume  Final   Culture NO GROWTH < 24 HOURS  Final   Report Status PENDING  Incomplete  MRSA PCR Screening     Status: None   Collection Time: 06/23/16  7:47 PM  Result Value Ref Range Status   MRSA by PCR NEGATIVE NEGATIVE Final    Comment:        The GeneXpert MRSA Assay (FDA approved for NASAL specimens only), is one component of a comprehensive MRSA colonization surveillance program. It is not intended to diagnose MRSA infection nor to guide or monitor treatment for MRSA infections.     Radiology Studies: Ct Head Wo Contrast  Result Date: 06/23/2016 CLINICAL DATA:  Code stroke, LEFT facial and LEFT arm numbness, rectal bleeding, history ulcer disease, colonic diverticulosis EXAM: CT HEAD WITHOUT CONTRAST TECHNIQUE: Contiguous axial images were obtained from the base of the skull through the vertex without intravenous contrast. Sagittal and coronal MPR images reconstructed from axial data set.  COMPARISON:  None FINDINGS: Brain: Mild generalized atrophy. Normal ventricular morphology. No midline shift or mass effect. Otherwise normal appearance of brain parenchyma. No intracranial hemorrhage, mass lesion or evidence acute infarction. No extra-axial fluid collections. Vascular: Unremarkable Skull: Intact Sinuses/Orbits: Clear Other: N/A IMPRESSION: No acute intracranial abnormality. Findings called to [facility] in ICU on 06/23/2016 at 1620 hours. Electronically Signed   By: Lavonia Dana M.D.   On: 06/23/2016 16:23   Ct Abdomen Pelvis W Contrast  Result Date: 06/22/2016 CLINICAL DATA:  Acute onset of severe generalized abdominal pain. Bright red rectal bleeding. Diaphoresis. Initial encounter. EXAM: CT ABDOMEN AND PELVIS WITH CONTRAST TECHNIQUE: Multidetector CT imaging of the abdomen and pelvis was performed using the standard protocol following bolus administration of intravenous contrast. CONTRAST:  152mL ISOVUE-300 IOPAMIDOL (ISOVUE-300) INJECTION 61% COMPARISON:  CT of the abdomen and pelvis from 11/22/2014 FINDINGS: Lower chest: Minimal bibasilar atelectasis or scarring is noted. Scattered coronary artery calcifications are seen. Hepatobiliary: The liver is unremarkable in appearance. The gallbladder is unremarkable in appearance. The common bile duct remains normal in caliber. Pancreas: There is mild dilatation of the pancreatic duct to 4-5 mm in maximal diameter, of uncertain significance. The pancreas is otherwise unremarkable. Spleen: The spleen is unremarkable in appearance. Adrenals/Urinary Tract: The adrenal glands are unremarkable in appearance. Small bilateral renal cysts are seen. Nonspecific perinephric stranding is noted bilaterally. There is no evidence of hydronephrosis. No renal or ureteral stones are identified. Stomach/Bowel: The stomach is unremarkable in appearance. The small bowel is within normal limits. The appendix is not visualized; there is no evidence for appendicitis. Mild  diverticulosis is noted along the ascending colon. Mildly prominent vasculature is noted extending along the rectal wall, raising concern for internal hemorrhoids. Vascular/Lymphatic: Scattered calcification is seen along the abdominal aorta  and its branches. Mild mural thrombus is seen along the common iliac arteries bilaterally. The abdominal aorta is otherwise grossly unremarkable. The inferior vena cava is grossly unremarkable. No retroperitoneal lymphadenopathy is seen. No pelvic sidewall lymphadenopathy is identified. Reproductive: The bladder is mildly distended and grossly unremarkable. The prostate is enlarged, measuring 5.3 cm in transverse dimension. Other: No additional soft tissue abnormalities are seen. Musculoskeletal: No acute osseous abnormalities are identified. Multilevel vacuum phenomenon is noted along the lumbar spine, with endplate sclerotic change. The visualized musculature is unremarkable in appearance. IMPRESSION: 1. Mildly prominent vasculature extending along the rectal wall, raising concern for internal hemorrhoids. 2. Mild dilatation of the pancreatic duct to 4-5 mm in maximal diameter, of uncertain significance. Pancreas otherwise unremarkable in appearance. This appears relatively stable from 2016 and may reflect the patient's baseline. 3. Scattered bilateral renal cysts. 4. Mild diverticulosis along the ascending colon, without diverticulitis. 5. Scattered aortic atherosclerosis. 6. Enlarged prostate noted. 7. Degenerative change along the lumbar spine. Electronically Signed   By: Garald Balding M.D.   On: 06/22/2016 22:39   Dg Chest Port 1 View  Result Date: 06/22/2016 CLINICAL DATA:  Abdominal pain and rectal bleeding. EXAM: PORTABLE CHEST 1 VIEW COMPARISON:  Chest radiograph 11/15/2014 FINDINGS: Lower lung volumes from prior exam. Stable elevation of left hemidiaphragm. Heart size and mediastinal contours are unchanged, borderline cardiomegaly. No pulmonary edema, large  pleural effusion or pneumothorax. Stable osseous structures. Air within the stomach in the left upper quadrant. No obvious free air under the hemidiaphragm. Abdominal CT is planned. IMPRESSION: Borderline cardiomegaly.  Chronic elevation of left hemidiaphragm. Electronically Signed   By: Jeb Levering M.D.   On: 06/22/2016 21:24    Scheduled Meds: . sodium chloride   Intravenous Once  . feeding supplement  1 Container Oral TID BM  . levothyroxine  112 mcg Oral QAC breakfast  . omega-3 acid ethyl esters  2 g Oral Daily  . pantoprazole  40 mg Oral Daily  . pravastatin  20 mg Oral QHS  . sodium chloride  1,000 mL Intravenous Once  . sodium chloride flush  3 mL Intravenous Q12H   Continuous Infusions: . sodium chloride 100 mL/hr at 06/24/16 0723    LOS: 1 day   Bethena Roys, MD [facility] Pager [phone].  If 7PM-7AM, please contact night-coverage [email or web address] Password [facility] 06/24/2016, 5:32 PM

## 2016-06-24 NOTE — Progress Notes (Signed)
Nutrition Brief Note  Patient identified on the Malnutrition Screening Tool (MST) Report  Patient presents with lower GIB. He has returned now from colonoscopy. Multiple diverticula found and per MD note bleeding most likely due to ascending colin diverticulosis. He has  2 units PRBC's ordered.   He is still resting following procedure but his spouse is here and says pt has a very good appetite and denies acute wt changes. This correlates with his hospital wt hx as well.   Wt Readings from Last 15 Encounters:  06/24/16 173 lb (78.5 kg)  02/07/15 180 lb (81.6 kg)  11/09/14 176 lb 4.8 oz (80 kg)  01/18/14 165 lb (74.8 kg)  12/29/13 165 lb (74.8 kg)  12/16/12 170 lb (77.1 kg)  11/26/12 166 lb (75.3 kg)  11/19/12 165 lb 12.8 oz (75.2 kg)  10/16/12 176 lb (79.8 kg)  03/17/12 172 lb (78 kg)    Body mass index is 22.21 kg/m. Patient meets criteria for normal based on current BMI.   Current diet order is NPO.   Recent Labs Lab 06/22/16 2050 06/22/16 2052 06/23/16 1307 06/24/16 0423  NA 135 137 139 137  K 3.0* 5.6* 3.5 3.7  CL 102 107 111 111  CO2 24  --  23 23  BUN 18 24* 13 10  CREATININE 1.27* 1.20 0.83 0.71  CALCIUM 8.7*  --  8.1* 7.8*  MG  --   --  1.8  --   GLUCOSE 167* 146* 148* 128*     Labs and medications reviewed.   No nutrition interventions warranted at this time. If nutrition issues arise, please consult RD.   Colman Cater MS,RD,CSG,LDN Office: [phone] Pager: [phone]

## 2016-06-24 NOTE — Progress Notes (Signed)
Subjective: 5 large blood bowel movements this morning with associated lower abdominal pain. Passed out for a few seconds this morning after appearing to have had a vasovagal response. Notes lower abdominal pain, appears anxious. First unit of blood started this morning. No N/V. Wife at bedside.   Objective: Vital signs in last 24 hours: Temp:  [97.6 F (36.4 C)-98.6 F (37 C)] 97.6 F (36.4 C) (04/02 0800) Pulse Rate:  [89-118] 109 (04/02 0800) Resp:  [14-22] 16 (04/02 0800) BP: (94-156)/(53-80) 98/53 (04/02 0800) SpO2:  [100 %] 100 % (04/02 0800) Weight:  [173 lb 11.6 oz (78.8 kg)-178 lb 5.6 oz (80.9 kg)] 173 lb 11.6 oz (78.8 kg) (04/02 0400) Last BM Date: 06/24/16 General:   Alert and oriented, anxious  Head:  Normocephalic and atraumatic. Eyes:  No icterus, sclera clear. Conjuctiva pink.  Abdomen:  Bowel sounds present, soft, tender to palpation bilateral lower abdomen Msk:  Symmetrical without gross deformities. Normal posture. Neurologic:  Alert and  oriented x4 Psych:  Alert and cooperative.   Intake/Output from previous day: 04/01 0701 - 04/02 0700 In: 1443.7 [I.V.:1149.2; Blood:294.5] Out: 800 [Urine:800] Intake/Output this shift: Total I/O In: 0.8 [Blood:0.8] Out: -   Lab Results:  Recent Labs  06/23/16 1307 06/23/16 2200 06/24/16 0423  WBC 10.4 9.8 10.9*  HGB 8.5* 8.0* 7.1*  HCT 25.9* 23.8* 21.2*  PLT 198 164 181   BMET  Recent Labs  06/22/16 2050 06/22/16 2052 06/23/16 1307 06/24/16 0423  NA 135 137 139 137  K 3.0* 5.6* 3.5 3.7  CL 102 107 111 111  CO2 24  --  23 23  GLUCOSE 167* 146* 148* 128*  BUN 18 24* 13 10  CREATININE 1.27* 1.20 0.83 0.71  CALCIUM 8.7*  --  8.1* 7.8*   LFT  Recent Labs  06/22/16 2050 06/23/16 1307  PROT 6.5 5.2*  ALBUMIN 3.6 2.9*  AST 18 20  ALT 16* 13*  ALKPHOS 50 38  BILITOT 0.5 0.7   PT/INR  Recent Labs  06/22/16 2050  LABPROT 12.8  INR 0.97    Studies/Results: Ct Head Wo Contrast  Result  Date: 06/23/2016 CLINICAL DATA:  Code stroke, LEFT facial and LEFT arm numbness, rectal bleeding, history ulcer disease, colonic diverticulosis EXAM: CT HEAD WITHOUT CONTRAST TECHNIQUE: Contiguous axial images were obtained from the base of the skull through the vertex without intravenous contrast. Sagittal and coronal MPR images reconstructed from axial data set. COMPARISON:  None FINDINGS: Brain: Mild generalized atrophy. Normal ventricular morphology. No midline shift or mass effect. Otherwise normal appearance of brain parenchyma. No intracranial hemorrhage, mass lesion or evidence acute infarction. No extra-axial fluid collections. Vascular: Unremarkable Skull: Intact Sinuses/Orbits: Clear Other: N/A IMPRESSION: No acute intracranial abnormality. Findings called to [facility] in ICU on 06/23/2016 at 1620 hours. Electronically Signed   By: Lavonia Dana M.D.   On: 06/23/2016 16:23   Ct Abdomen Pelvis W Contrast  Result Date: 06/22/2016 CLINICAL DATA:  Acute onset of severe generalized abdominal pain. Bright red rectal bleeding. Diaphoresis. Initial encounter. EXAM: CT ABDOMEN AND PELVIS WITH CONTRAST TECHNIQUE: Multidetector CT imaging of the abdomen and pelvis was performed using the standard protocol following bolus administration of intravenous contrast. CONTRAST:  169mL ISOVUE-300 IOPAMIDOL (ISOVUE-300) INJECTION 61% COMPARISON:  CT of the abdomen and pelvis from 11/22/2014 FINDINGS: Lower chest: Minimal bibasilar atelectasis or scarring is noted. Scattered coronary artery calcifications are seen. Hepatobiliary: The liver is unremarkable in appearance. The gallbladder is unremarkable in appearance. The  common bile duct remains normal in caliber. Pancreas: There is mild dilatation of the pancreatic duct to 4-5 mm in maximal diameter, of uncertain significance. The pancreas is otherwise unremarkable. Spleen: The spleen is unremarkable in appearance. Adrenals/Urinary Tract: The adrenal glands are unremarkable in  appearance. Small bilateral renal cysts are seen. Nonspecific perinephric stranding is noted bilaterally. There is no evidence of hydronephrosis. No renal or ureteral stones are identified. Stomach/Bowel: The stomach is unremarkable in appearance. The small bowel is within normal limits. The appendix is not visualized; there is no evidence for appendicitis. Mild diverticulosis is noted along the ascending colon. Mildly prominent vasculature is noted extending along the rectal wall, raising concern for internal hemorrhoids. Vascular/Lymphatic: Scattered calcification is seen along the abdominal aorta and its branches. Mild mural thrombus is seen along the common iliac arteries bilaterally. The abdominal aorta is otherwise grossly unremarkable. The inferior vena cava is grossly unremarkable. No retroperitoneal lymphadenopathy is seen. No pelvic sidewall lymphadenopathy is identified. Reproductive: The bladder is mildly distended and grossly unremarkable. The prostate is enlarged, measuring 5.3 cm in transverse dimension. Other: No additional soft tissue abnormalities are seen. Musculoskeletal: No acute osseous abnormalities are identified. Multilevel vacuum phenomenon is noted along the lumbar spine, with endplate sclerotic change. The visualized musculature is unremarkable in appearance. IMPRESSION: 1. Mildly prominent vasculature extending along the rectal wall, raising concern for internal hemorrhoids. 2. Mild dilatation of the pancreatic duct to 4-5 mm in maximal diameter, of uncertain significance. Pancreas otherwise unremarkable in appearance. This appears relatively stable from 2016 and may reflect the patient's baseline. 3. Scattered bilateral renal cysts. 4. Mild diverticulosis along the ascending colon, without diverticulitis. 5. Scattered aortic atherosclerosis. 6. Enlarged prostate noted. 7. Degenerative change along the lumbar spine. Electronically Signed   By: Garald Balding M.D.   On: 06/22/2016 22:39    Dg Chest Port 1 View  Result Date: 06/22/2016 CLINICAL DATA:  Abdominal pain and rectal bleeding. EXAM: PORTABLE CHEST 1 VIEW COMPARISON:  Chest radiograph 11/15/2014 FINDINGS: Lower lung volumes from prior exam. Stable elevation of left hemidiaphragm. Heart size and mediastinal contours are unchanged, borderline cardiomegaly. No pulmonary edema, large pleural effusion or pneumothorax. Stable osseous structures. Air within the stomach in the left upper quadrant. No obvious free air under the hemidiaphragm. Abdominal CT is planned. IMPRESSION: Borderline cardiomegaly.  Chronic elevation of left hemidiaphragm. Electronically Signed   By: Jeb Levering M.D.   On: 06/22/2016 21:24    Assessment: 71 year old male admitted 3/31 with GI bleed, acute blood loss anemia, attempting to prep for colonoscopy yesterday but developed left-sided numbness and Code Stroke was called. CT head unrevealing. Now in ICU, with abdominal pain onset early this morning prior to multiple episodes of large blood bowel movements. Hgb this morning 7.1, down from 8 yesterday. 1 unit PRBCs have started, additional unit to be given. Additional large, bloody BM with clots after assessment this morning with improvement in abdominal cramping and resting now in bed with no distress. Patient continues to have syncopal episodes while on bedpan.   Plan: NPO 2 units PRBCs ordered: 1st unit started already this morning.  Serial H/H Discussed with Dr. Oneida Alar: 2 tap water enemas now, colonoscopy with Propofol today. Blood transfusion to be given over 2 hours. I relayed this to nursing staff, who confirms rate of blood transfusion is currently set for 2 hours each unit.    Annitta Needs, ANP-BC [facility]    LOS: 1 day    06/24/2016, 8:22 AM

## 2016-06-24 NOTE — H&P (View-Only) (Signed)
Subjective: 5 large blood bowel movements this morning with associated lower abdominal pain. Passed out for a few seconds this morning after appearing to have had a vasovagal response. Notes lower abdominal pain, appears anxious. First unit of blood started this morning. No N/V. Wife at bedside.   Objective: Vital signs in last 24 hours: Temp:  [97.6 F (36.4 C)-98.6 F (37 C)] 97.6 F (36.4 C) (04/02 0800) Pulse Rate:  [89-118] 109 (04/02 0800) Resp:  [14-22] 16 (04/02 0800) BP: (94-156)/(53-80) 98/53 (04/02 0800) SpO2:  [100 %] 100 % (04/02 0800) Weight:  [173 lb 11.6 oz (78.8 kg)-178 lb 5.6 oz (80.9 kg)] 173 lb 11.6 oz (78.8 kg) (04/02 0400) Last BM Date: 06/24/16 General:   Alert and oriented, anxious  Head:  Normocephalic and atraumatic. Eyes:  No icterus, sclera clear. Conjuctiva pink.  Abdomen:  Bowel sounds present, soft, tender to palpation bilateral lower abdomen Msk:  Symmetrical without gross deformities. Normal posture. Neurologic:  Alert and  oriented x4 Psych:  Alert and cooperative.   Intake/Output from previous day: 04/01 0701 - 04/02 0700 In: 1443.7 [I.V.:1149.2; Blood:294.5] Out: 800 [Urine:800] Intake/Output this shift: Total I/O In: 0.8 [Blood:0.8] Out: -   Lab Results:  Recent Labs  06/23/16 1307 06/23/16 2200 06/24/16 0423  WBC 10.4 9.8 10.9*  HGB 8.5* 8.0* 7.1*  HCT 25.9* 23.8* 21.2*  PLT 198 164 181   BMET  Recent Labs  06/22/16 2050 06/22/16 2052 06/23/16 1307 06/24/16 0423  NA 135 137 139 137  K 3.0* 5.6* 3.5 3.7  CL 102 107 111 111  CO2 24  --  23 23  GLUCOSE 167* 146* 148* 128*  BUN 18 24* 13 10  CREATININE 1.27* 1.20 0.83 0.71  CALCIUM 8.7*  --  8.1* 7.8*   LFT  Recent Labs  06/22/16 2050 06/23/16 1307  PROT 6.5 5.2*  ALBUMIN 3.6 2.9*  AST 18 20  ALT 16* 13*  ALKPHOS 50 38  BILITOT 0.5 0.7   PT/INR  Recent Labs  06/22/16 2050  LABPROT 12.8  INR 0.97    Studies/Results: Ct Head Wo Contrast  Result  Date: 06/23/2016 CLINICAL DATA:  Code stroke, LEFT facial and LEFT arm numbness, rectal bleeding, history ulcer disease, colonic diverticulosis EXAM: CT HEAD WITHOUT CONTRAST TECHNIQUE: Contiguous axial images were obtained from the base of the skull through the vertex without intravenous contrast. Sagittal and coronal MPR images reconstructed from axial data set. COMPARISON:  None FINDINGS: Brain: Mild generalized atrophy. Normal ventricular morphology. No midline shift or mass effect. Otherwise normal appearance of brain parenchyma. No intracranial hemorrhage, mass lesion or evidence acute infarction. No extra-axial fluid collections. Vascular: Unremarkable Skull: Intact Sinuses/Orbits: Clear Other: N/A IMPRESSION: No acute intracranial abnormality. Findings called to [facility] in ICU on 06/23/2016 at 1620 hours. Electronically Signed   By: Lavonia Dana M.D.   On: 06/23/2016 16:23   Ct Abdomen Pelvis W Contrast  Result Date: 06/22/2016 CLINICAL DATA:  Acute onset of severe generalized abdominal pain. Bright red rectal bleeding. Diaphoresis. Initial encounter. EXAM: CT ABDOMEN AND PELVIS WITH CONTRAST TECHNIQUE: Multidetector CT imaging of the abdomen and pelvis was performed using the standard protocol following bolus administration of intravenous contrast. CONTRAST:  147mL ISOVUE-300 IOPAMIDOL (ISOVUE-300) INJECTION 61% COMPARISON:  CT of the abdomen and pelvis from 11/22/2014 FINDINGS: Lower chest: Minimal bibasilar atelectasis or scarring is noted. Scattered coronary artery calcifications are seen. Hepatobiliary: The liver is unremarkable in appearance. The gallbladder is unremarkable in appearance. The  common bile duct remains normal in caliber. Pancreas: There is mild dilatation of the pancreatic duct to 4-5 mm in maximal diameter, of uncertain significance. The pancreas is otherwise unremarkable. Spleen: The spleen is unremarkable in appearance. Adrenals/Urinary Tract: The adrenal glands are unremarkable in  appearance. Small bilateral renal cysts are seen. Nonspecific perinephric stranding is noted bilaterally. There is no evidence of hydronephrosis. No renal or ureteral stones are identified. Stomach/Bowel: The stomach is unremarkable in appearance. The small bowel is within normal limits. The appendix is not visualized; there is no evidence for appendicitis. Mild diverticulosis is noted along the ascending colon. Mildly prominent vasculature is noted extending along the rectal wall, raising concern for internal hemorrhoids. Vascular/Lymphatic: Scattered calcification is seen along the abdominal aorta and its branches. Mild mural thrombus is seen along the common iliac arteries bilaterally. The abdominal aorta is otherwise grossly unremarkable. The inferior vena cava is grossly unremarkable. No retroperitoneal lymphadenopathy is seen. No pelvic sidewall lymphadenopathy is identified. Reproductive: The bladder is mildly distended and grossly unremarkable. The prostate is enlarged, measuring 5.3 cm in transverse dimension. Other: No additional soft tissue abnormalities are seen. Musculoskeletal: No acute osseous abnormalities are identified. Multilevel vacuum phenomenon is noted along the lumbar spine, with endplate sclerotic change. The visualized musculature is unremarkable in appearance. IMPRESSION: 1. Mildly prominent vasculature extending along the rectal wall, raising concern for internal hemorrhoids. 2. Mild dilatation of the pancreatic duct to 4-5 mm in maximal diameter, of uncertain significance. Pancreas otherwise unremarkable in appearance. This appears relatively stable from 2016 and may reflect the patient's baseline. 3. Scattered bilateral renal cysts. 4. Mild diverticulosis along the ascending colon, without diverticulitis. 5. Scattered aortic atherosclerosis. 6. Enlarged prostate noted. 7. Degenerative change along the lumbar spine. Electronically Signed   By: Garald Balding M.D.   On: 06/22/2016 22:39    Dg Chest Port 1 View  Result Date: 06/22/2016 CLINICAL DATA:  Abdominal pain and rectal bleeding. EXAM: PORTABLE CHEST 1 VIEW COMPARISON:  Chest radiograph 11/15/2014 FINDINGS: Lower lung volumes from prior exam. Stable elevation of left hemidiaphragm. Heart size and mediastinal contours are unchanged, borderline cardiomegaly. No pulmonary edema, large pleural effusion or pneumothorax. Stable osseous structures. Air within the stomach in the left upper quadrant. No obvious free air under the hemidiaphragm. Abdominal CT is planned. IMPRESSION: Borderline cardiomegaly.  Chronic elevation of left hemidiaphragm. Electronically Signed   By: Jeb Levering M.D.   On: 06/22/2016 21:24    Assessment: 71 year old male admitted 3/31 with GI bleed, acute blood loss anemia, attempting to prep for colonoscopy yesterday but developed left-sided numbness and Code Stroke was called. CT head unrevealing. Now in ICU, with abdominal pain onset early this morning prior to multiple episodes of large blood bowel movements. Hgb this morning 7.1, down from 8 yesterday. 1 unit PRBCs have started, additional unit to be given. Additional large, bloody BM with clots after assessment this morning with improvement in abdominal cramping and resting now in bed with no distress. Patient continues to have syncopal episodes while on bedpan.   Plan: NPO 2 units PRBCs ordered: 1st unit started already this morning.  Serial H/H Discussed with Dr. Oneida Alar: 2 tap water enemas now, colonoscopy with Propofol today. Blood transfusion to be given over 2 hours. I relayed this to nursing staff, who confirms rate of blood transfusion is currently set for 2 hours each unit.    Annitta Needs, ANP-BC [facility]    LOS: 1 day    06/24/2016, 8:22 AM

## 2016-06-25 DIAGNOSIS — D62 Acute posthemorrhagic anemia: Secondary | ICD-10-CM

## 2016-06-25 DIAGNOSIS — K5731 Diverticulosis of large intestine without perforation or abscess with bleeding: Secondary | ICD-10-CM

## 2016-06-25 LAB — PREPARE RBC (CROSSMATCH)

## 2016-06-25 LAB — CBC
HCT: 20.3 % — ABNORMAL LOW (ref 39.0–52.0)
HEMATOCRIT: 21.8 % — AB (ref 39.0–52.0)
Hemoglobin: 6.7 g/dL — CL (ref 13.0–17.0)
Hemoglobin: 7.6 g/dL — ABNORMAL LOW (ref 13.0–17.0)
MCH: 29.5 pg (ref 26.0–34.0)
MCH: 30.2 pg (ref 26.0–34.0)
MCHC: 33 g/dL (ref 30.0–36.0)
MCHC: 34.9 g/dL (ref 30.0–36.0)
MCV: 89.4 fL (ref 78.0–100.0)
MCV: 86.5 fL (ref 78.0–100.0)
PLATELETS: 131 10*3/uL — AB (ref 150–400)
PLATELETS: 110 10*3/uL — AB (ref 150–400)
RBC: 2.27 MIL/uL — AB (ref 4.22–5.81)
RBC: 2.52 MIL/uL — AB (ref 4.22–5.81)
RDW: 15 % (ref 11.5–15.5)
RDW: 14.9 % (ref 11.5–15.5)
WBC: 13.3 10*3/uL — ABNORMAL HIGH (ref 4.0–10.5)
WBC: 15.2 10*3/uL — AB (ref 4.0–10.5)

## 2016-06-25 LAB — HEMOGLOBIN AND HEMATOCRIT, BLOOD
HEMATOCRIT: 15.1 % — AB (ref 39.0–52.0)
HEMOGLOBIN: 5.3 g/dL — AB (ref 13.0–17.0)

## 2016-06-25 LAB — GLUCOSE, CAPILLARY: GLUCOSE-CAPILLARY: 108 mg/dL — AB (ref 65–99)

## 2016-06-25 LAB — ABO/RH: ABO/RH(D): AB POS

## 2016-06-25 MED ORDER — SODIUM CHLORIDE 0.9 % IV SOLN
INTRAVENOUS | Status: DC
  Administered 2016-06-25 – 2016-06-27 (×3): via INTRAVENOUS

## 2016-06-25 MED ORDER — PANTOPRAZOLE SODIUM 40 MG PO TBEC
40.0000 mg | DELAYED_RELEASE_TABLET | Freq: Every day | ORAL | Status: DC
  Administered 2016-06-25 – 2016-06-30 (×5): 40 mg via ORAL
  Filled 2016-06-25 (×5): qty 1

## 2016-06-25 MED ORDER — SODIUM CHLORIDE 0.9 % IV SOLN
Freq: Once | INTRAVENOUS | Status: AC
  Administered 2016-06-25: 23:00:00 via INTRAVENOUS

## 2016-06-25 MED ORDER — SODIUM CHLORIDE 0.9 % IV BOLUS (SEPSIS)
500.0000 mL | Freq: Once | INTRAVENOUS | Status: DC
  Administered 2016-06-25: 500 mL via INTRAVENOUS

## 2016-06-25 MED ORDER — CHLORHEXIDINE GLUCONATE 0.12 % MT SOLN
15.0000 mL | Freq: Two times a day (BID) | OROMUCOSAL | Status: DC
  Administered 2016-06-25 – 2016-06-30 (×9): 15 mL via OROMUCOSAL
  Filled 2016-06-25 (×8): qty 15

## 2016-06-25 MED ORDER — SODIUM CHLORIDE 0.9 % IV SOLN: Freq: Once | INTRAVENOUS | Status: DC

## 2016-06-25 MED ORDER — MORPHINE SULFATE (PF) 2 MG/ML IV SOLN
2.0000 mg | INTRAVENOUS | Status: DC | PRN
  Administered 2016-06-25 – 2016-06-27 (×4): 2 mg via INTRAVENOUS
  Filled 2016-06-25 (×4): qty 1

## 2016-06-25 MED ORDER — ORAL CARE MOUTH RINSE
15.0000 mL | Freq: Two times a day (BID) | OROMUCOSAL | Status: DC
  Administered 2016-06-29 – 2016-06-30 (×2): 15 mL via OROMUCOSAL

## 2016-06-25 MED ORDER — SODIUM CHLORIDE 0.9 % IV BOLUS (SEPSIS)
1000.0000 mL | Freq: Once | INTRAVENOUS | Status: AC
  Administered 2016-06-25: 1000 mL via INTRAVENOUS

## 2016-06-25 NOTE — Progress Notes (Signed)
REVIEWED. AGREE. NO ADDITIONAL RECOMMENDATIONS.  Received call from [facility] that patient had another bloody BM. I contacted nuclear medicine and ordered a tagged RBC scan stat. NM aware.   Annitta Needs, ANP-BC [facility] Gastroenter[facility]

## 2016-06-25 NOTE — Progress Notes (Signed)
Tech was giving bath and patient became briefly diaphoretic and became lethargic. Called GI and they are aware.

## 2016-06-25 NOTE — Progress Notes (Signed)
CRITICAL VALUE ALERT  Critical value received:  Hemoglobin 6.7  Date of notification:  06/25/16  Time of notification:  0627  Critical value read back: yes  Nurse who received alert:  Christianne Borrow  MD notified (1st page):  yes  Time of first page:  0629  Responding MD:  Dr. Orvan Falconer  Time MD responded:  [phone]

## 2016-06-25 NOTE — Progress Notes (Signed)
Subjective: 2 bloody BMs overnight, with last at 2am and none since that time. Crampy abdominal discomfort but better than yesterday per wife. No N/V. Hgb dropped 3 grams from yesterday evening, with 2 units PRBCs ordered this morning but not begun yet.   Objective: Vital signs in last 24 hours: Temp:  [97.8 F (36.6 C)-99.7 F (37.6 C)] 98.3 F (36.8 C) (04/03 0748) Pulse Rate:  [87-118] 101 (04/03 0400) Resp:  [12-24] 22 (04/03 0400) BP: (104-142)/(67-89) 124/74 (04/03 0400) SpO2:  [97 %-100 %] 100 % (04/03 0400) Weight:  [173 lb (78.5 kg)-178 lb 5.6 oz (80.9 kg)] 178 lb 5.6 oz (80.9 kg) (04/03 0500) Last BM Date: 06/24/16 General:   Alert and oriented, pleasant, appears less anxious than yesterday.  Head:  Normocephalic and atraumatic. Abdomen:  Bowel sounds present, soft, mild TTP lower abdomen, non-distended. No HSM or hernias noted. No rebound or guarding. No masses appreciated  Msk:  Symmetrical without gross deformities. Normal posture. Pulses:  Normal pulses noted. Extremities:  Without  edema. Neurologic:  Alert and  oriented x4 Psych:  Alert and cooperative. Normal mood and affect.  Intake/Output from previous day: 04/02 0701 - 04/03 0700 In: 3499.2 [P.O.:300; I.V.:2178.3; Blood:1020.8] Out: 1550 [Urine:1550] Intake/Output this shift: Total I/O In: -  Out: 200 [Urine:200]  Lab Results:  Recent Labs  06/24/16 1226 06/24/16 1748 06/25/16 0410  WBC 13.4* 13.7* 13.3*  HGB 8.1* 9.2* 6.7*  HCT 23.8* 26.4* 20.3*  PLT 146* 136* 131*   BMET  Recent Labs  06/22/16 2050 06/22/16 2052 06/23/16 1307 06/24/16 0423  NA 135 137 139 137  K 3.0* 5.6* 3.5 3.7  CL 102 107 111 111  CO2 24  --  23 23  GLUCOSE 167* 146* 148* 128*  BUN 18 24* 13 10  CREATININE 1.27* 1.20 0.83 0.71  CALCIUM 8.7*  --  8.1* 7.8*   LFT  Recent Labs  06/22/16 2050 06/23/16 1307  PROT 6.5 5.2*  ALBUMIN 3.6 2.9*  AST 18 20  ALT 16* 13*  ALKPHOS 50 38  BILITOT 0.5 0.7    PT/INR  Recent Labs  06/22/16 2050  LABPROT 12.8  INR 0.97    Studies/Results: Ct Head Wo Contrast  Result Date: 06/23/2016 CLINICAL DATA:  Code stroke, LEFT facial and LEFT arm numbness, rectal bleeding, history ulcer disease, colonic diverticulosis EXAM: CT HEAD WITHOUT CONTRAST TECHNIQUE: Contiguous axial images were obtained from the base of the skull through the vertex without intravenous contrast. Sagittal and coronal MPR images reconstructed from axial data set. COMPARISON:  None FINDINGS: Brain: Mild generalized atrophy. Normal ventricular morphology. No midline shift or mass effect. Otherwise normal appearance of brain parenchyma. No intracranial hemorrhage, mass lesion or evidence acute infarction. No extra-axial fluid collections. Vascular: Unremarkable Skull: Intact Sinuses/Orbits: Clear Other: N/A IMPRESSION: No acute intracranial abnormality. Findings called to [facility] in ICU on 06/23/2016 at 1620 hours. Electronically Signed   By: Lavonia Dana M.D.   On: 06/23/2016 16:23    Assessment: 71 year old male admitted with acute lower GI bleed, acute blood loss anemia, with colonoscopy 4/2 noting TI containing red blood, 6 mm polyp in cecum s/p polypectomy, multiple small and large-mouthed diverticula in ascending colon. GI bleed felt to be most likely due to ascending diverticulosis. 2 episodes of rectal bleeding overnight, with last documented before 2am. No overt bleeding at time of assessment this morning. Hgb down to 6.7 this morning (from 9.2 yesterday). Total of 4 units PRBCs already received with  an additional 2 units ordered this morning.   Called blood bank and spoke with Vinnie Level: she has notated in chart to keep 4 units ahead at all times.   Spoke with Purcell Nails, RN, who is taking care of patient today. He is to call me if any further bleeding and will proceed with tagged RBC scan stat.   Discussed with Dr. Oneida Alar: will consult surgery as well. Dr. Rosana Hoes on call.    Plan: Keep 4 units ahead Nursing staff to call if any further evidence of overt bleeding and will order stat tagged RBC scan General Surgery consulted 2 units PRBCs this morning May have dysphagia 3 diet Will continue to follow with you  Annitta Needs, ANP-BC [facility] Gastroenterology    LOS: 2 days    06/25/2016, 8:06 AM

## 2016-06-25 NOTE — Progress Notes (Signed)
Called report to [employer] at [facility] who will be nurse for Mr. Bennye Alm

## 2016-06-25 NOTE — Progress Notes (Signed)
Called wife and informed her of transport to [facility]. [facility] has left with patient and on their way. Wife had taken almost all of patients belongings home. Rest taken by [facility]. Plasma still flowing and will have to be stopped by [facility] Nurse staff.

## 2016-06-25 NOTE — Progress Notes (Signed)
PROGRESS NOTE    Sean Leonard  GNF:[id number] DOB: 1945-07-31 DOA: 06/22/2016 PCP: Pcp Not In System   Brief Narrative:  Sean Leonard is an 71 y.o. male with hx of known diverticulosis with colonoscopy by Dr Felipe Drone 3 years ago, hx of Barretts' s/p EGD same time, DDD, hypothyrodism, bilateral iliac artery aneurysm, presented to the ER with BRBPR.  He had abdominal discomfort a couple of days ago, but has none now.  No black stool or epigastric pain.   He denied using NSAIDS, and had not been on ASA due to his prior hx of PUD.  He did feel lightheaded, and on presentation in the ER, he was hypotensive, but responded to IVF.  Hb was found to be at 12.2g per dl, normal BUN, and normal INR.  He has normal renal fx tests.  His BP on presentation was in the 70's, and now 110 without significant IVF.  A CT of the abd pelvis was done showing showing enlarge pancreatic duct, suspicious for chronic enlargement, and diverticulosis, with internal hemorrhoid.  06/23/16- 2 rapid response pages overhead ,1st one- patient was found on the floor after having a large bloody bowel movement, 2nd page- patient reported numbness in his left upper extremity , so code stroke was called, no Weakness or symptoms to suggest CVA. Stat head CT done negative for intra-cranial hemorrhage considering patient was on the floor though he was sure he did not hit his head as he was conscious the whole time, and aware.   Assessment & Plan:   Principal Problem:   Lower GI bleed Active Problems:   Barrett's esophagus  LGIB: Several multiple bloody bowel movements, significant amounts today. Hemoglobin 6.7,  Has had total of 6 units of transfusion this admission.  - CBC Q8H, and give IVF 125cc/hr - Will give one unit FFP, as pt has had multiple units of blood. - Endoscopy today by GI- Dr. Oneida Alar- Polyp, ascending colon diverticulosis, if further bleeding will need tagged blood cell scan/angiography, embolization.  Gen. surgery also  consulted here, also agreed with transfer - Considering persistent bleed, will transfer patient to [facility], step down unit. Talked to IR, Dr. Anselm Pancoast, agreed with transfer, admit to hospitalist service. Recommends we do not do the tagged red blood cell nuclear scan here but at [facility]. Talked to [facility] Silverio Decamp at [facility], no benefit in another colonoscopy, hence Gi consult not necessary.  Syncopal episode, Hypotension:  several vasovagal episodes after bowel movement with resultant syncopal attack.   Left upper extremity numbness- resolved, head CT neg for intracranial bleed, thorough neuro exam nonfocal, yesterday and today. As likely related to vasovagal activity, anxiety, syncopal attack.  - No further stroke workup - Lipid panel a.m, hemoglobin A1c, PT OT eval  Hx of Barretts and PUD:  No NSAIDS use and no ASA.  No evidence of UGIB.  DVT prophylaxis:  SCDs  Code Status: Full Family Communication: Wife present at bedside earlier in the day.  [facility] Plan[facility]  Consultants:   Gastroenterology  Procedures:  Colonoscopy.  Antimicrobials: None   Subjective: Persistent lower GI bleeding. No chest pain or dizziness. No numbness or tingling, or assymetric weakness of extremities.  Objective: Vitals:   06/25/16 0900 06/25/16 1000 06/25/16 1115 06/25/16 1118  BP: 90/75 (!) 98/54    Pulse: (!) 117 (!) 114 (!) 104   Resp: (!) 24 19 19    Temp:   98 F (36.7 C) 97.9 F (36.6 C)  TempSrc:  Oral Oral  SpO2: 100% 97% 100%   Weight:      Height:        Intake/Output Summary (Last 24 hours) at 06/25/16 1134 Last data filed at 06/25/16 1100  Gross per 24 hour  Intake          3515.83 ml  Output             1750 ml  Net          1765.83 ml   Filed Weights   06/24/16 0400 06/24/16 1048 06/25/16 0500  Weight: 78.8 kg (173 lb 11.6 oz) 78.5 kg (173 lb) 80.9 kg (178 lb 5.6 oz)    Examination:  General exam: Awake ,appears weak, tongue appears pale. Respiratory system: Clear  to auscultation. Respiratory effort normal. Cardiovascular system: S1 & S2 heard, RRR. No JVD, murmurs, rubs, gallops or clicks. No pedal edema. Gastrointestinal system: mild Tender lower abdomen, nondistended, soft . No organomegaly or masses felt. Normal bowel sounds heard. Central nervous system: Alert and oriented. No focal neurological deficits.PERRL. Strength 5 over 5 all extremities sensation intact.  Extremities: Symmetric 5 x 5 power. Skin: No rashes, lesions or ulcers  Data Reviewed: I have personally reviewed following labs and imaging studies  CBC:  Recent Labs Lab 06/22/16 2050  06/23/16 2200 06/24/16 0423 06/24/16 1226 06/24/16 1748 06/25/16 0410  WBC 10.0  < > 9.8 10.9* 13.4* 13.7* 13.3*  NEUTROABS 3.0  --   --   --   --   --   --   HGB 11.7*  < > 8.0* 7.1* 8.1* 9.2* 6.7*  HCT 35.7*  < > 23.8* 21.2* 23.8* 26.4* 20.3*  MCV 90.8  < > 88.8 88.3 87.8 86.3 89.4  PLT 243  < > 164 181 146* 136* 131*  < > = values in this interval not displayed. Basic Metabolic Panel:  Recent Labs Lab 06/22/16 2050 06/22/16 2052 06/23/16 1307 06/24/16 0423  NA 135 137 139 137  K 3.0* 5.6* 3.5 3.7  CL 102 107 111 111  CO2 24  --  23 23  GLUCOSE 167* 146* 148* 128*  BUN 18 24* 13 10  CREATININE 1.27* 1.20 0.83 0.71  CALCIUM 8.7*  --  8.1* 7.8*  MG  --   --  1.8  --    Liver Function Tests:  Recent Labs Lab 06/22/16 2050 06/23/16 1307  AST 18 20  ALT 16* 13*  ALKPHOS 50 38  BILITOT 0.5 0.7  PROT 6.5 5.2*  ALBUMIN 3.6 2.9*    Recent Labs Lab 06/22/16 2050  LIPASE 33   Coagulation Profile:  Recent Labs Lab 06/22/16 2050  INR 0.97   Cardiac Enzymes:  Recent Labs Lab 06/22/16 2050 06/23/16 1307  TROPONINI <0.03 <0.03   CBG:  Recent Labs Lab 06/23/16 1234 06/24/16 0635  GLUCAP 152* 139*   Sepsis Labs:  Recent Labs Lab 06/22/16 2056 06/23/16 0900 06/23/16 1500  LATICACIDVEN 2.76* 2.5* 1.9    Recent Results (from the past 240 hour(s))    Culture, blood (Routine x 2)     Status: None (Preliminary result)   Collection Time: 06/22/16  9:08 PM  Result Value Ref Range Status   Specimen Description BLOOD LEFT ARM  Final   Special Requests BOTTLES DRAWN AEROBIC AND ANAEROBIC 6CC EACH  Final   Culture NO GROWTH 3 DAYS  Final   Report Status PENDING  Incomplete  Culture, blood (Routine x 2)     Status: None (Preliminary  result)   Collection Time: 06/22/16  9:25 PM  Result Value Ref Range Status   Specimen Description BLOOD RIGHT ARM  Final   Special Requests BOTTLES DRAWN AEROBIC AND ANAEROBIC 4CC EACH  Final   Culture NO GROWTH 3 DAYS  Final   Report Status PENDING  Incomplete  Culture, blood (routine x 2)     Status: None (Preliminary result)   Collection Time: 06/23/16  1:08 PM  Result Value Ref Range Status   Specimen Description BLOOD  Final   Special Requests   Final    Blood Culture results may not be optimal due to an inadequate volume of blood received in culture bottles   Culture NO GROWTH 2 DAYS  Final   Report Status PENDING  Incomplete  Culture, blood (routine x 2)     Status: None (Preliminary result)   Collection Time: 06/23/16  1:09 PM  Result Value Ref Range Status   Specimen Description BLOOD  Final   Special Requests Blood Culture adequate volume  Final   Culture NO GROWTH 2 DAYS  Final   Report Status PENDING  Incomplete  MRSA PCR Screening     Status: None   Collection Time: 06/23/16  7:47 PM  Result Value Ref Range Status   MRSA by PCR NEGATIVE NEGATIVE Final    Comment:        The GeneXpert MRSA Assay (FDA approved for NASAL specimens only), is one component of a comprehensive MRSA colonization surveillance program. It is not intended to diagnose MRSA infection nor to guide or monitor treatment for MRSA infections.     Radiology Studies: Ct Head Wo Contrast  Result Date: 06/23/2016 CLINICAL DATA:  Code stroke, LEFT facial and LEFT arm numbness, rectal bleeding, history ulcer disease,  colonic diverticulosis EXAM: CT HEAD WITHOUT CONTRAST TECHNIQUE: Contiguous axial images were obtained from the base of the skull through the vertex without intravenous contrast. Sagittal and coronal MPR images reconstructed from axial data set. COMPARISON:  None FINDINGS: Brain: Mild generalized atrophy. Normal ventricular morphology. No midline shift or mass effect. Otherwise normal appearance of brain parenchyma. No intracranial hemorrhage, mass lesion or evidence acute infarction. No extra-axial fluid collections. Vascular: Unremarkable Skull: Intact Sinuses/Orbits: Clear Other: N/A IMPRESSION: No acute intracranial abnormality. Findings called to [facility] in ICU on 06/23/2016 at 1620 hours. Electronically Signed   By: Lavonia Dana M.D.   On: 06/23/2016 16:23    Scheduled Meds: . sodium chloride   Intravenous Once  . sodium chloride   Intravenous Once  . feeding supplement  1 Container Oral TID BM  . levothyroxine  112 mcg Oral QAC breakfast  . omega-3 acid ethyl esters  2 g Oral Daily  . pantoprazole  40 mg Oral QAC breakfast  . pravastatin  20 mg Oral QHS  . sodium chloride  1,000 mL Intravenous Once  . sodium chloride flush  3 mL Intravenous Q12H   Continuous Infusions: . sodium chloride 500 mL (06/25/16 1112)    LOS: 2 days   Bethena Roys, MD [facility] Pager [phone].  If 7PM-7AM, please contact night-coverage [email or web address] Password [facility] 06/25/2016, 11:34 AM

## 2016-06-25 NOTE — Consult Note (Signed)
SURGICAL CONSULTATION NOTE (initial) - cpt: 99254  HISTORY OF PRESENT ILLNESS (HPI):  71 y.o. male presented to [facility] ED 4 days ago for bright red blood per rectum and lightheadedness with hypotension to SBP 70's while in ED and some abdominal "discomfort" a few days prior to presentation. Patient was previously recognized to have ascending colonic diverticulosis on colonoscopy by Dr. Sydell Axon 3 years ago, along with Barrett's esophagitis + transmetaplasia. Since admission, patient has had several bloody bowel movements consisting of bright red blood as well as some darker clots. He also reports moderate Right-sided abdominal pain, described as "crampy". He underwent colonoscopy yesterday, which visualized blood in the colon and terminal ileum along with ascending colonic diverticulosis and polypectomy of a 6 mm sessile cecal polyp, but no active bleeding was seen. He was transfused 2 Units PRBC for Hb 8.1, which increased to only 9.2 yesterday, and Hb this morning has decreased further to 6.7 with hypotension and tachycardia.  Surgery is consulted by medical physician Dr. Denton Brick in this context for evaluation and management of likely ascending colonic diverticular lower GI bleed.  PAST MEDICAL HISTORY (PMH):  Past Medical History:  Diagnosis Date  . DDD (degenerative disc disease), lumbar   . Diverticula of colon   . Iliac artery aneurysm, bilateral (HCC)    with significant stenosis of right internal iliac artery  . Peptic ulcer   . Prostatitis   . Thyroid disease      PAST SURGICAL HISTORY ([place]):  Past Surgical History:  Procedure Laterality Date  . COLONOSCOPY  April 2005   Dr. Irving Shows. Small internal hemorrhoids. Multiple small scattered diverticula in the cecum and descending colon.  . COLONOSCOPY N/A 01/18/2014   Procedure: COLONOSCOPY;  Surgeon: Daneil Dolin, MD;  Location: [facility];  Service: Endoscopy;  Laterality: N/A;  1100  . COLONOSCOPY WITH ESOPHAGOGASTRODUODENOSCOPY  (EGD) N/A 12/16/2012   Dr. Gala Romney: rectal and colonic polyps with inadequate preparation, tubular adenoma. EGD with biopsy proven short segment Barrett's esophagus, hiatal hernia, mild chronic inactive gastritis   . ESOPHAGOGASTRODUODENOSCOPY  July 2005   Dr. Irving Shows grade 2 esophagitis at the GE junction. Acute gastritis. Multiple acute active, deep, irregular, friable ulcers in the antrum and body of the stomach. 2 active, deep, friable, irregular ulcers in the duodenum. Pathology not available.  . ESOPHAGOGASTRODUODENOSCOPY N/A 01/18/2014   Procedure: ESOPHAGOGASTRODUODENOSCOPY (EGD);  Surgeon: Daneil Dolin, MD;  Location: [facility];  Service: Endoscopy;  Laterality: N/A;  . THYROIDECTOMY    . TONSILLECTOMY       MEDICATIONS:  Prior to Admission medications   Medication Sig Start Date End Date Taking? Authorizing Provider  fish oil-omega-3 fatty acids 1000 MG capsule Take 2 g by mouth daily.   Yes Historical Provider, MD  levothyroxine (SYNTHROID, LEVOTHROID) 112 MCG tablet Take 112 mcg by mouth every morning.   Yes Historical Provider, MD  Multiple Vitamin (MULTIVITAMIN WITH MINERALS) TABS tablet Take 1 tablet by mouth daily.   Yes Historical Provider, MD  omeprazole (PRILOSEC) 20 MG capsule TAKE 1 CAPSULE EVERY DAY 12/27/15  Yes Mahala Menghini, PA-C  pravastatin (PRAVACHOL) 20 MG tablet Take 20 mg by mouth at bedtime.   Yes Historical Provider, MD     ALLERGIES:  No Known Allergies   SOCIAL HISTORY:  Social History   Social History  . Marital status: Married    Spouse name: N/A  . Number of children: N/A  . Years of education: N/A   Occupational History  .  Not on file.   Social History Main Topics  . Smoking status: Former Smoker    Years: 25.00    Types: Cigars    Quit date: 11/27/2006  . Smokeless tobacco: Never Used     Comment: Quit in 2009  . Alcohol use No     Comment: former. 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   . Drug use: No  . Sexual activity: Not on file   Other Topics Concern  . Not on file   Social History Narrative   1 deceased child. 1 living    FAMILY HISTORY:  Family History  Problem Relation Age of Onset  . Hypertension Mother   . Heart disease Mother     before age 42  . Heart disease Father     before age 53  . Hyperlipidemia Sister   . Cancer Sister   . Colon cancer Neg Hx   . Liver disease Neg Hx     REVIEW OF SYSTEMS:  Constitutional: denies weight loss, fever, chills, or sweats  Eyes: denies any other vision changes, history of eye injury  ENT: denies sore throat, hearing problems  Respiratory: denies shortness of breath, wheezing  Cardiovascular: denies chest pain, palpitations  Gastrointestinal: abdominal pain, N/V, and bowel function as per HPI Genitourinary: denies burning with urination or urinary frequency Musculoskeletal: denies any other joint pains or cramps  Skin: denies any other rashes or skin discolorations  Neurological: denies any other headache, but reports dizziness and weakness  Psychiatric: denies any other depression, anxiety   All other review of systems were negative   VITAL SIGNS:  Temp:  [97.8 F (36.6 C)-99.7 F (37.6 C)] 99 F (37.2 C) (04/03 0847) Pulse Rate:  [87-118] 114 (04/03 1000) Resp:  [12-24] 19 (04/03 1000) BP: (90-142)/(54-89) 98/54 (04/03 1000) SpO2:  [97 %-100 %] 97 % (04/03 1000) Weight:  [78.5 kg (173 lb)-80.9 kg (178 lb 5.6 oz)] 80.9 kg (178 lb 5.6 oz) (04/03 0500)     Height: 6\' 2"  (188 cm) Weight: 80.9 kg (178 lb 5.6 oz) BMI (Calculated): 22.3   INTAKE/OUTPUT:  This shift: Total I/O In: 640 [P.O.:240; I.V.:400] Out: 200 [Urine:200]  Last 2 shifts: @IOLAST2SHIFTS @   PHYSICAL EXAM:  Constitutional:  -- Normal body habitus  -- Awake and oriented x3, but somewhat lethargic  Eyes:  -- Pupils equally round and reactive to light  -- No scleral icterus  Ear, nose, and throat:  -- No jugular venous  distension  Pulmonary:  -- No crackles  -- Equal breath sounds bilaterally -- Breathing non-labored at rest Cardiovascular:  -- S1, S2 present  -- No pericardial rubs Gastrointestinal:  -- Abdomen soft and non-distended with moderate Right-sided abdominal pain, no guarding/rebound  -- No abdominal masses appreciated, pulsatile or otherwise  Musculoskeletal and Integumentary:  -- Wounds or skin discoloration: None appreciated -- Extremities: B/L UE and LE FROM, hands and feet warm, no edema  Neurologic:  -- Motor function: intact and symmetric -- Sensation: intact and symmetric  Labs:  CBC Latest Ref Rng & Units 06/25/2016 06/24/2016 06/24/2016  WBC 4.0 - 10.5 K/uL 13.3(H) 13.7(H) 13.4(H)  Hemoglobin 13.0 - 17.0 g/dL 6.7(LL) 9.2(L) 8.1(L)  Hematocrit 39.0 - 52.0 % 20.3(L) 26.4(L) 23.8(L)  Platelets 150 - 400 K/uL 131(L) 136(L) 146(L)   BMP Latest Ref Rng & Units 06/24/2016 06/23/2016 06/22/2016  Glucose 65 - 99 mg/dL 128(H) 148(H) 146(H)  BUN 6 - 20 mg/dL 10  13 24(H)  Creatinine 0.61 - 1.24 mg/dL 0.71 0.83 1.20  Sodium 135 - 145 mmol/L 137 139 137  Potassium 3.5 - 5.1 mmol/L 3.7 3.5 5.6(H)  Chloride 101 - 111 mmol/L 111 111 107  CO2 22 - 32 mmol/L 23 23 -  Calcium 8.9 - 10.3 mg/dL 7.8(L) 8.1(L) -   Imaging studies:  CT Abdomen and Pelvis with Contrast (06/22/2016) 1. Mildly prominent vasculature extending along the rectal wall, raising concern for internal hemorrhoids. 2. Mild dilatation of the pancreatic duct to 4-5 mm in maximal diameter, of uncertain significance. Pancreas otherwise unremarkable in appearance. This appears relatively stable from 2016 and may reflect the patient's baseline. 3. Scattered bilateral renal cysts. 4. Mild diverticulosis along the ascending colon, without diverticulitis. 5. Scattered aortic atherosclerosis. 6. Enlarged prostate noted. 7. Degenerative change along the lumbar spine.  Colonoscopy (06/24/2016) The terminal ileum contained red blood. A 6  mm polyp was found in the cecum. The polyp was sessile. The polyp was removed with a hot snare. Resection and retrieval were complete. The recto-sigmoid colon, sigmoid colon and descending colon were moderately redundant. Multiple small and large-mouthed diverticula were found in the ascending colon.  Assessment/Plan: (ICD-10's: K68.31, D75) 71 y.o. male with acute hemodynamically significant lower gastrointestinal hemorrhage, likely attributable to ascending colonic diverticulosis, complicated by pertinent comorbidities including GERD with PUD, thyroid disease, B/L iliac artery aneurysms with Right internal iliac arterial stenosis, prostatitis, and lumbar spine degenerative disc disease.   - ICU supportive care and transfusion prn as per hospitalist   - recommend IR visceral angiography with embolization in context of active diverticular hemorrhage  - may consider nuclear medicine bleeding scan if concern for slower bleed, though this offers only diagnosis without intervention  - medical management of co-morbidities as per medical team, will follow along with primary team unless patient is transferred  - possible need for Right colectomy discussed with patient, who expresses understanding  - DVT prophylaxis  All of the above findings and recommendations were discussed with the patient and ICU nursing staff, and all of patient's questions were answered to his expressed satisfaction.  Thank you for the opportunity to participate in this patient's care.   -- Marilynne Drivers Rosana Hoes, MD, [place]: [facility] Office: [phone]

## 2016-06-25 NOTE — Progress Notes (Signed)
Had a second bowel movement this shift. Bright to dark blood with few clots. Moderate amount. Patient was asked to roll to remove pan and became very diaphoretic with cool clammy skin then very lethargic for the second time this morning. Heart rate did increase to 125 blood pressure is being taken on left forearm due to IV access in upper left arm BP 94/63. Blood being infused in right AC.

## 2016-06-25 NOTE — Progress Notes (Addendum)
Physician notified: [facility] admissions.  At: 1739  Regarding: FYI Pt arrived from [facility]. VSS. Going to reorder T+S and CBC at 1630, plasma completed with EMS during transport.  1832: [place]:  pt and spouse in room. told planned for NM gi study in AM. No active bleeding at this time. Labs ordered for 1830.  Orders: NS @ 60ml/hr. Clear liquid diet through breakfast. CBC now and in AM.

## 2016-06-25 NOTE — Addendum Note (Signed)
Addendum  created 06/25/16 1013 by Mickel Baas, CRNA   Sign clinical note

## 2016-06-25 NOTE — Progress Notes (Signed)
Patient having severe abdominal pain and cramping, scored 10 out of 10. Patient was tearful and in extreme discomfort. Placed patient on bed pan and patient had third, medium sized, bloody stool since beginning of shift. Some discomfort was relieved after bowel movement but was still in some pain. MD made aware via text page about pain and multiple bloody bowel movements. PRN morphine 2 mg IV ordered and was given. Patient now seems to be more relaxed. Will continue to monitor.

## 2016-06-25 NOTE — Progress Notes (Addendum)
RN paged NP earlier as pt was tachy in the 120-130 range. BP 95/69. 1L bolus ordered and Stat H/H. Hgb 5.3. 2Units of FFP ordered and 3U PRBCs. CBC 1.5-2 hours after transfusions complete. Per RN, pt has had 2 bloody BMs since 1900 hrs. Watch carefully. Pt is for NM tag study in am and IR has been consulted unofficially for plan of care.  KJKG, NP [facility]  NP to bedside to see pt: S: pt endorses feeling worse than anytime during his hospitalization. States abdominal pain is also worsening. No n/v. No palpitations. Feels "cold" and "weak".  O: acutely ill appearing AAM in NAD. Appears quite miserable and fatigued. VS_BP 104 after bolus. HR still in 120s. RR and SaO2 normal. Conjunctiva pale. Card: tachy. S1S2. Abd: BS + x 4 quads. Abdomen slightly distended but not rigid. Generalized tenderness on palpation in all quads.Neuro: no focal deficits.   A/P: 1. GIB, continues to bleed. Hgb trending downward in spite of transfusions. Will give 3U PRBCs now and 2U FFP. Watch vitals closely. Low threshold to TF to ICU.    NP reviewed chart thoroughly including rounding MD, surgery and GI notes. Not sure why Tag study was not done upon transfer when pt reached [facility]. Due to hemodynamic instability and now a Hgb of 5.3 with continued bleeding, NP ordered tag study stat and contacted Dr. Anselm Pancoast of IR. We discussed pt and he didn't understand either why test wasn't done upon arrival to [facility].  IR Plan: Continue blood transfusions and FFP. Tag study team being called in for stat study. Dr. Anselm Pancoast asked that he be called with results of Tag study.  [facility], NP [facility] Update 0620: NP spoke with Dr. Anselm Pancoast again and relayed results of tag study. + bleeding at the pelvic brim in the rectum. Dr. Anselm Pancoast will angio pt this am. Pt has been NPO since MN. No further bloody stools. VS look better since transfusions. Pt is almost complete 3/3 PRBCs and has had FFP. CBC due 1.5 hrs after blood, which should time out to be around 0800.   Report left for oncoming attending. KJKG, NP [facility]

## 2016-06-25 NOTE — Anesthesia Postprocedure Evaluation (Signed)
Anesthesia Post Note  Patient: Sean Leonard  Procedure(s) Performed: Procedure(s) (LRB): COLONOSCOPY WITH PROPOFOL (N/A) POLYPECTOMY  Patient location during evaluation: ICU Anesthesia Type: MAC Level of consciousness: awake and alert and oriented Pain management: pain level controlled Vital Signs Assessment: post-procedure vital signs reviewed and stable Respiratory status: spontaneous breathing Cardiovascular status: stable Postop Assessment: no signs of nausea or vomiting Anesthetic complications: no     Last Vitals:  Vitals:   06/25/16 0900 06/25/16 1000  BP: 90/75 (!) 98/54  Pulse: (!) 117 (!) 114  Resp: (!) 24 19  Temp:      Last Pain:  Vitals:   06/25/16 0847  TempSrc: Oral  PainSc:                  [name] A

## 2016-06-25 NOTE — Progress Notes (Signed)
PT Cancellation Note  Patient Details Name: Sean Leonard MRN: [id number] DOB: [birth year]   Cancelled Treatment:    Reason Eval/Treat Not Completed: Patient not medically ready (H&H after transfusion yesterday is 6.7/20.3, and he is currently receiving another transfusion.  Spoke with RN who expressed that he has had several bloody BM's, has been diaphoretic with cool clammy skin then very lethargic, tachycardic with HR 125bpm, and hypotensive with BP: 94/63.  Will check back tomorrow. )  Beth Marlene Beidler, PT, DPT X: [phone]

## 2016-06-26 ENCOUNTER — Encounter (HOSPITAL_COMMUNITY): Payer: Self-pay | Admitting: Internal Medicine

## 2016-06-26 ENCOUNTER — Inpatient Hospital Stay (HOSPITAL_COMMUNITY): Payer: Medicare HMO

## 2016-06-26 DIAGNOSIS — D62 Acute posthemorrhagic anemia: Secondary | ICD-10-CM

## 2016-06-26 HISTORY — PX: IR US GUIDE VASC ACCESS RIGHT: IMG2390

## 2016-06-26 HISTORY — PX: IR ANGIOGRAM VISCERAL SELECTIVE: IMG657

## 2016-06-26 LAB — BPAM RBC
BLOOD PRODUCT EXPIRATION DATE: 201804132359
BLOOD PRODUCT EXPIRATION DATE: 201804252359
BLOOD PRODUCT EXPIRATION DATE: 201804252359
BLOOD PRODUCT EXPIRATION DATE: 201804252359
BLOOD PRODUCT EXPIRATION DATE: 201804292359
Blood Product Expiration Date: 201804242359
Blood Product Expiration Date: 201804262359
Blood Product Expiration Date: 201804262359
ISSUE DATE / TIME: 201804011714
ISSUE DATE / TIME: 201804030818
ISSUE DATE / TIME: 201804031124
ISSUE DATE / TIME: 201804020745
ISSUE DATE / TIME: 201804021010
ISSUE DATE / TIME: 201804021313
UNIT TYPE AND RH: 6200
UNIT TYPE AND RH: 6200
UNIT TYPE AND RH: 6200
Unit Type and Rh: 600
Unit Type and Rh: 600
Unit Type and Rh: 600
Unit Type and Rh: 6200
Unit Type and Rh: 6200

## 2016-06-26 LAB — TYPE AND SCREEN
ABO/RH(D): AB POS
Antibody Screen: NEGATIVE
UNIT DIVISION: 0
UNIT DIVISION: 0
UNIT DIVISION: 0
UNIT DIVISION: 0
UNIT DIVISION: 0
Unit division: 0
Unit division: 0
Unit division: 0

## 2016-06-26 LAB — CBC
HCT: 22.2 % — ABNORMAL LOW (ref 39.0–52.0)
HEMATOCRIT: 21.8 % — AB (ref 39.0–52.0)
HEMATOCRIT: 21.2 % — AB (ref 39.0–52.0)
HEMOGLOBIN: 7.4 g/dL — AB (ref 13.0–17.0)
Hemoglobin: 7.7 g/dL — ABNORMAL LOW (ref 13.0–17.0)
Hemoglobin: 7.7 g/dL — ABNORMAL LOW (ref 13.0–17.0)
MCH: 30.7 pg (ref 26.0–34.0)
MCH: 30 pg (ref 26.0–34.0)
MCH: 30.3 pg (ref 26.0–34.0)
MCHC: 35.3 g/dL (ref 30.0–36.0)
MCHC: 34.7 g/dL (ref 30.0–36.0)
MCHC: 34.9 g/dL (ref 30.0–36.0)
MCV: 86.9 fL (ref 78.0–100.0)
MCV: 86.4 fL (ref 78.0–100.0)
MCV: 86.9 fL (ref 78.0–100.0)
PLATELETS: 93 10*3/uL — AB (ref 150–400)
Platelets: 90 10*3/uL — ABNORMAL LOW (ref 150–400)
Platelets: 104 10*3/uL — ABNORMAL LOW (ref 150–400)
RBC: 2.51 MIL/uL — ABNORMAL LOW (ref 4.22–5.81)
RBC: 2.57 MIL/uL — AB (ref 4.22–5.81)
RBC: 2.44 MIL/uL — AB (ref 4.22–5.81)
RDW: 14.5 % (ref 11.5–15.5)
RDW: 14.2 % (ref 11.5–15.5)
RDW: 14.3 % (ref 11.5–15.5)
WBC: 14.1 10*3/uL — AB (ref 4.0–10.5)
WBC: 12.8 10*3/uL — AB (ref 4.0–10.5)
WBC: 12.9 10*3/uL — AB (ref 4.0–10.5)

## 2016-06-26 LAB — BPAM FFP
BLOOD PRODUCT EXPIRATION DATE: 201804082359
ISSUE DATE / TIME: 201804031441
Unit Type and Rh: 8400

## 2016-06-26 LAB — PREPARE FRESH FROZEN PLASMA: UNIT DIVISION: 0

## 2016-06-26 LAB — PREPARE RBC (CROSSMATCH)

## 2016-06-26 MED ORDER — IOPAMIDOL (ISOVUE-300) INJECTION 61%
INTRAVENOUS | Status: AC
  Administered 2016-06-26: 75 mL
  Filled 2016-06-26: qty 100

## 2016-06-26 MED ORDER — SODIUM PERTECHNETATE TC 99M INJECTION
20.0000 | Freq: Once | INTRAVENOUS | Status: AC | PRN
  Administered 2016-06-26: 20 via INTRAVENOUS

## 2016-06-26 MED ORDER — MIDAZOLAM HCL 2 MG/2ML IJ SOLN
INTRAMUSCULAR | Status: AC
  Filled 2016-06-26: qty 2

## 2016-06-26 MED ORDER — SODIUM CHLORIDE 0.9 % IV SOLN: Freq: Once | INTRAVENOUS | Status: DC

## 2016-06-26 MED ORDER — LIDOCAINE HCL (PF) 1 % IJ SOLN
INTRAMUSCULAR | Status: AC
  Filled 2016-06-26: qty 10

## 2016-06-26 MED ORDER — FENTANYL CITRATE (PF) 100 MCG/2ML IJ SOLN
INTRAMUSCULAR | Status: AC
  Filled 2016-06-26: qty 2

## 2016-06-26 MED ORDER — IOPAMIDOL (ISOVUE-300) INJECTION 61%
INTRAVENOUS | Status: AC
  Administered 2016-06-26: 90 mL
  Filled 2016-06-26: qty 150

## 2016-06-26 MED ORDER — FENTANYL CITRATE (PF) 100 MCG/2ML IJ SOLN
INTRAMUSCULAR | Status: AC | PRN
  Administered 2016-06-26: 25 ug via INTRAVENOUS

## 2016-06-26 MED ORDER — MIDAZOLAM HCL 2 MG/2ML IJ SOLN
INTRAMUSCULAR | Status: AC | PRN
  Administered 2016-06-26: 1 mg via INTRAVENOUS

## 2016-06-26 MED ORDER — LIDOCAINE HCL 1 % IJ SOLN
INTRAMUSCULAR | Status: AC | PRN
  Administered 2016-06-26: 5 mL

## 2016-06-26 NOTE — Care Management Note (Signed)
Case Management Note  Patient Details  Name: Sean Leonard MRN: [id number] Date of Birth: [birth year]  Subjective/Objective:    Transfer from [facility], he is from home with wife, presents with GIB, per red tag study here at [facility] suggest bleeding at pelvic brim/rectum, angiogram is neg, recommendations to consult GI for repeat colonoscopy to try to determine source of bleeding.  NCM will cont to follow for dc needs.                 Action/Plan:   Expected Discharge Date:                  Expected Discharge Plan:  Home/Self Care  In-House Referral:     Discharge planning Services  CM Consult  Post Acute Care Choice:    Choice offered to:     DME Arranged:    DME Agency:     HH Arranged:    HH Agency:     Status of Service:  In process, will continue to follow  If discussed at Long Length of Stay Meetings, dates discussed:    Additional Comments:  Zenon Mayo, RN 06/26/2016, 11:03 PM

## 2016-06-26 NOTE — Progress Notes (Addendum)
PROGRESS NOTE    Sean Leonard  GYK:[id number] DOB: [birth year] DOA: 06/22/2016 PCP: Pcp Not In System   Brief Narrative:  Sean Leonard is an 71 y.o. male with hx of known diverticulosis with colonoscopy by Dr Felipe Drone 3 years ago, hx of Barretts' s/p EGD same time, DDD, hypothyrodism, bilateral iliac artery aneurysm, presented to the ER with BRBPR.  He had abdominal discomfort a couple of days ago, but has none now.  No black stool or epigastric pain.   He denied using NSAIDS, and had not been on ASA due to his prior hx of PUD.   Hb was found to be at 12.2g per dl, normal BUN, and normal INR.  He has normal renal fx tests.  His BP on presentation was in the 70's, and now 110 without significant IVF.  A CT of the abd pelvis was done showing showing enlarge pancreatic duct, suspicious for chronic enlargement, and diverticulosis, with internal hemorrhoid.  Colonoscopy was done at [facility].  Patient re-bled and was sent to [facility] for bleeding scan and then IR for angiogram.  No source of bleeding found.  Hgb dropped to 5 and has been given 2 more units of blood.    Assessment & Plan:   Principal Problem:   Lower GI bleed Active Problems:   Barrett's esophagus  LGIB:  - Endoscopy 4/2 by GI- Dr. Oneida Alar- Polyp, ascending colon diverticulosis had further bleeding got tagged blood cell scan/angiography but no source of bleeding found. (spoke with IR, not sure bleeding on scan is accurate- could be bladder etc)   GS is recommending GI consult as source of bleeding not clear for them and he may benefit from a colonoscopy.  Spoke with Dr. Silverio Decamp-- call back IF patient begins to bleed and she will attempt to scope. Currently patient is NOT bleeding.  Plan to start clears and watch closely -cbc q 6 hours- plts starting to increase -will have 2 units PRBC ready for transfusion if needed -if bleeding is severe IR suggested that CTA may be beneficial at that time  Syncopal episode, Hypotension:   several vasovagal episodes after bowel movement  Left upper extremity numbness- resolved, head CT neg for intracranial bleed, - No further stroke workup  ABLA -has been given FFP and PRBC (appears to be 9 units of PRBC) -will have 2 units ready  Leukocytosis -trending down  Hx of Barretts and PUD:  No NSAIDS use and no ASA.  No evidence of UGIB.  DVT prophylaxis:  SCDs  Code Status: Full Family Communication: Wife/daughter Disposition Plan: remain in SDU  Consultants:   Gastroenterology  Procedures:  Colonoscopy. Bleeding scan angiogram  Antimicrobials: None   Subjective: No bleeding since last PM  Objective: Vitals:   06/26/16 0858 06/26/16 1025 06/26/16 1100 06/26/16 1200  BP: 112/63 106/64 118/71 111/62  Pulse: 82 88 91 82  Resp: 17 (!) 0 14 (!) 0  Temp:   98.7 F (37.1 C)   TempSrc:   Axillary   SpO2: 100% 100% 100% 100%  Weight:      Height:        Intake/Output Summary (Last 24 hours) at 06/26/16 1549 Last data filed at 06/26/16 1200  Gross per 24 hour  Intake             3164 ml  Output              475 ml  Net  2689 ml   Filed Weights   06/24/16 1048 06/25/16 0500 06/25/16 1733  Weight: 78.5 kg (173 lb) 80.9 kg (178 lb 5.6 oz) 80.8 kg (178 lb 2.1 oz)    Examination:  General exam: Awake, NAD Respiratory system: Clear to auscultation. Respiratory effort normal. Cardiovascular system: S1 & S2 heard, RRR. No JVD, murmurs, rubs, gallops or clicks. No pedal edema. Gastrointestinal system: mildly tender, + BS Central nervous system: A+Ox3, NAD Extremities: Symmetric 5 x 5 power. Skin: No rashes, lesions or ulcers  Data Reviewed: I have personally reviewed following labs and imaging studies  CBC:  Recent Labs Lab 06/22/16 2050  06/24/16 1748 06/25/16 0410 06/25/16 1844 06/25/16 2138 06/26/16 0954 06/26/16 1417  WBC 10.0  < > 13.7* 13.3* 15.2*  --  14.1* 12.8*  NEUTROABS 3.0  --   --   --   --   --   --   --   HGB 11.7*  <  > 9.2* 6.7* 7.6* 5.3* 7.7* 7.7*  HCT 35.7*  < > 26.4* 20.3* 21.8* 15.1* 21.8* 22.2*  MCV 90.8  < > 86.3 89.4 86.5  --  86.9 86.4  PLT 243  < > 136* 131* 110*  --  90* 93*  < > = values in this interval not displayed. Basic Metabolic Panel:  Recent Labs Lab 06/22/16 2050 06/22/16 2052 06/23/16 1307 06/24/16 0423  NA 135 137 139 137  K 3.0* 5.6* 3.5 3.7  CL 102 107 111 111  CO2 24  --  23 23  GLUCOSE 167* 146* 148* 128*  BUN 18 24* 13 10  CREATININE 1.27* 1.20 0.83 0.71  CALCIUM 8.7*  --  8.1* 7.8*  MG  --   --  1.8  --    Liver Function Tests:  Recent Labs Lab 06/22/16 2050 06/23/16 1307  AST 18 20  ALT 16* 13*  ALKPHOS 50 38  BILITOT 0.5 0.7  PROT 6.5 5.2*  ALBUMIN 3.6 2.9*    Recent Labs Lab 06/22/16 2050  LIPASE 33   Coagulation Profile:  Recent Labs Lab 06/22/16 2050  INR 0.97   Cardiac Enzymes:  Recent Labs Lab 06/22/16 2050 06/23/16 1307  TROPONINI <0.03 <0.03   CBG:  Recent Labs Lab 06/23/16 1234 06/24/16 0635 06/25/16 1416  GLUCAP 152* 139* 108*   Sepsis Labs:  Recent Labs Lab 06/22/16 2056 06/23/16 0900 06/23/16 1500  LATICACIDVEN 2.76* 2.5* 1.9    Recent Results (from the past 240 hour(s))  Culture, blood (Routine x 2)     Status: None (Preliminary result)   Collection Time: 06/22/16  9:08 PM  Result Value Ref Range Status   Specimen Description BLOOD LEFT ARM  Final   Special Requests BOTTLES DRAWN AEROBIC AND ANAEROBIC 6CC EACH  Final   Culture NO GROWTH 4 DAYS  Final   Report Status PENDING  Incomplete  Culture, blood (Routine x 2)     Status: None (Preliminary result)   Collection Time: 06/22/16  9:25 PM  Result Value Ref Range Status   Specimen Description BLOOD RIGHT ARM  Final   Special Requests BOTTLES DRAWN AEROBIC AND ANAEROBIC 4CC EACH  Final   Culture NO GROWTH 4 DAYS  Final   Report Status PENDING  Incomplete  Culture, blood (routine x 2)     Status: None (Preliminary result)   Collection Time: 06/23/16   1:08 PM  Result Value Ref Range Status   Specimen Description BLOOD  Final   Special Requests  Final    Blood Culture results may not be optimal due to an inadequate volume of blood received in culture bottles   Culture NO GROWTH 3 DAYS  Final   Report Status PENDING  Incomplete  Culture, blood (routine x 2)     Status: None (Preliminary result)   Collection Time: 06/23/16  1:09 PM  Result Value Ref Range Status   Specimen Description BLOOD  Final   Special Requests Blood Culture adequate volume  Final   Culture NO GROWTH 3 DAYS  Final   Report Status PENDING  Incomplete  MRSA PCR Screening     Status: None   Collection Time: 06/23/16  7:47 PM  Result Value Ref Range Status   MRSA by PCR NEGATIVE NEGATIVE Final    Comment:        The GeneXpert MRSA Assay (FDA approved for NASAL specimens only), is one component of a comprehensive MRSA colonization surveillance program. It is not intended to diagnose MRSA infection nor to guide or monitor treatment for MRSA infections.     Radiology Studies: Nm Gi Blood Loss  Result Date: 06/26/2016 CLINICAL DATA:  71 y/o M; bright red blood per rectum and lower abdominal pain for several days. Bloody stools 1.5 hours prior to imaging. EXAM: NUCLEAR MEDICINE GASTROINTESTINAL BLEEDING SCAN TECHNIQUE: Sequential abdominal images were obtained following intravenous administration of Tc-62m labeled red blood cells. RADIOPHARMACEUTICALS:  20.7 mCi Tc-71m in-vitro labeled red cells. COMPARISON:  06/22/2016 CT of the abdomen and pelvis. FINDINGS: On dynamic imaging there is progressive radiotracer accumulation within the bladder. Inferiorly at the level of the pelvic brim there is a second focus of radiotracer uptake with variable activity. No abnormal radiotracer uptake is seen passing through small or large bowel within the abdomen. IMPRESSION: Inferiorly at level of pelvic brim there is a focus of radiotracer uptake with variable activity likely  representing active bleeding within the rectum. No abnormal radiotracer uptake is seen passing through small or large bowel to suggest a more proximal bleed. Electronically Signed   By: Kristine Garbe M.D.   On: 06/26/2016 04:42   Ir Angiogram Visceral Selective  Result Date: 06/26/2016 INDICATION: Acute lower GI bleeding requiring transfusions EXAM: ULTRASOUND GUIDANCE FOR VASCULAR ACCESS CELIAC, SMA, AND IMA CATHETERIZATIONS AND ANGIOGRAMS MEDICATIONS: 1% lidocaine locally. ANESTHESIA/SEDATION: Moderate (conscious) sedation was employed during this procedure. A total of Versed 1.0 mg and Fentanyl 25 mcg was administered intravenously. Moderate Sedation Time: 35 minutes. The patient's level of consciousness and vital signs were monitored continuously by radiology nursing throughout the procedure under my direct supervision. CONTRAST:  100 cc Isovue FLUOROSCOPY TIME:  Fluoroscopy Time: 6 minutes 24 seconds (460 mGy). COMPLICATIONS: None immediate. PROCEDURE: Informed consent was obtained from the patient following explanation of the procedure, risks, benefits and alternatives. The patient understands, agrees and consents for the procedure. All questions were addressed. A time out was performed prior to the initiation of the procedure. Maximal barrier sterile technique utilized including caps, mask, sterile gowns, sterile gloves, large sterile drape, hand hygiene, and Betadine prep. Under sterile conditions and local anesthesia, ultrasound micropuncture access performed of the right common femoral artery. 5 French sheath inserted over a Bentson guidewire. Initially, a C2 catheter was utilized to select the SMA. SMA angiogram performed. SMA is widely patent. Jejunal branches throughout the mesentery are patent. No evidence of active bleeding. Replaced right hepatic artery noted off of the proximal SMA, a normal variant. On the venous phase, the superior mesenteric vein and portal vein  are patent. Catheter  was exchanged for a Mickelson catheter. This was reformed in the aorta and utilized to select the celiac origin. Celiac angiogram performed. Celiac origin is widely patent. Mild atherosclerotic change. Splenic, left gastric, and left hepatic vasculature are all patent. The gastroduodenal artery and gastroepiploic artery are visualized and patent. The catheter was retracted and utilized to select the IMA. Selective IMA angiogram performed. IMA is mildly atherosclerotic but patent. The superior hemorrhoidal and left colic branches are all patent. No evidence of active bleeding. On the venous phase, the IMV is patent. Access removed. Hemostasis obtained with the Exoseal device. No immediate complication. Patient tolerated the procedure well. IMPRESSION: Successful mesenteric angiograms of the celiac, SMA and IMA without evidence of active GI bleeding. Electronically Signed   By: Jerilynn Mages.  Shick M.D.   On: 06/26/2016 09:54   Ir Angiogram Visceral Selective  Result Date: 06/26/2016 INDICATION: Acute lower GI bleeding requiring transfusions EXAM: ULTRASOUND GUIDANCE FOR VASCULAR ACCESS CELIAC, SMA, AND IMA CATHETERIZATIONS AND ANGIOGRAMS MEDICATIONS: 1% lidocaine locally. ANESTHESIA/SEDATION: Moderate (conscious) sedation was employed during this procedure. A total of Versed 1.0 mg and Fentanyl 25 mcg was administered intravenously. Moderate Sedation Time: 35 minutes. The patient's level of consciousness and vital signs were monitored continuously by radiology nursing throughout the procedure under my direct supervision. CONTRAST:  100 cc Isovue FLUOROSCOPY TIME:  Fluoroscopy Time: 6 minutes 24 seconds (460 mGy). COMPLICATIONS: None immediate. PROCEDURE: Informed consent was obtained from the patient following explanation of the procedure, risks, benefits and alternatives. The patient understands, agrees and consents for the procedure. All questions were addressed. A time out was performed prior to the initiation of the  procedure. Maximal barrier sterile technique utilized including caps, mask, sterile gowns, sterile gloves, large sterile drape, hand hygiene, and Betadine prep. Under sterile conditions and local anesthesia, ultrasound micropuncture access performed of the right common femoral artery. 5 French sheath inserted over a Bentson guidewire. Initially, a C2 catheter was utilized to select the SMA. SMA angiogram performed. SMA is widely patent. Jejunal branches throughout the mesentery are patent. No evidence of active bleeding. Replaced right hepatic artery noted off of the proximal SMA, a normal variant. On the venous phase, the superior mesenteric vein and portal vein are patent. Catheter was exchanged for a Mickelson catheter. This was reformed in the aorta and utilized to select the celiac origin. Celiac angiogram performed. Celiac origin is widely patent. Mild atherosclerotic change. Splenic, left gastric, and left hepatic vasculature are all patent. The gastroduodenal artery and gastroepiploic artery are visualized and patent. The catheter was retracted and utilized to select the IMA. Selective IMA angiogram performed. IMA is mildly atherosclerotic but patent. The superior hemorrhoidal and left colic branches are all patent. No evidence of active bleeding. On the venous phase, the IMV is patent. Access removed. Hemostasis obtained with the Exoseal device. No immediate complication. Patient tolerated the procedure well. IMPRESSION: Successful mesenteric angiograms of the celiac, SMA and IMA without evidence of active GI bleeding. Electronically Signed   By: Jerilynn Mages.  Shick M.D.   On: 06/26/2016 09:54   Ir Angiogram Visceral Selective  Result Date: 06/26/2016 INDICATION: Acute lower GI bleeding requiring transfusions EXAM: ULTRASOUND GUIDANCE FOR VASCULAR ACCESS CELIAC, SMA, AND IMA CATHETERIZATIONS AND ANGIOGRAMS MEDICATIONS: 1% lidocaine locally. ANESTHESIA/SEDATION: Moderate (conscious) sedation was employed during  this procedure. A total of Versed 1.0 mg and Fentanyl 25 mcg was administered intravenously. Moderate Sedation Time: 35 minutes. The patient's level of consciousness and vital signs were monitored continuously by  radiology nursing throughout the procedure under my direct supervision. CONTRAST:  100 cc Isovue FLUOROSCOPY TIME:  Fluoroscopy Time: 6 minutes 24 seconds (460 mGy). COMPLICATIONS: None immediate. PROCEDURE: Informed consent was obtained from the patient following explanation of the procedure, risks, benefits and alternatives. The patient understands, agrees and consents for the procedure. All questions were addressed. A time out was performed prior to the initiation of the procedure. Maximal barrier sterile technique utilized including caps, mask, sterile gowns, sterile gloves, large sterile drape, hand hygiene, and Betadine prep. Under sterile conditions and local anesthesia, ultrasound micropuncture access performed of the right common femoral artery. 5 French sheath inserted over a Bentson guidewire. Initially, a C2 catheter was utilized to select the SMA. SMA angiogram performed. SMA is widely patent. Jejunal branches throughout the mesentery are patent. No evidence of active bleeding. Replaced right hepatic artery noted off of the proximal SMA, a normal variant. On the venous phase, the superior mesenteric vein and portal vein are patent. Catheter was exchanged for a Mickelson catheter. This was reformed in the aorta and utilized to select the celiac origin. Celiac angiogram performed. Celiac origin is widely patent. Mild atherosclerotic change. Splenic, left gastric, and left hepatic vasculature are all patent. The gastroduodenal artery and gastroepiploic artery are visualized and patent. The catheter was retracted and utilized to select the IMA. Selective IMA angiogram performed. IMA is mildly atherosclerotic but patent. The superior hemorrhoidal and left colic branches are all patent. No evidence  of active bleeding. On the venous phase, the IMV is patent. Access removed. Hemostasis obtained with the Exoseal device. No immediate complication. Patient tolerated the procedure well. IMPRESSION: Successful mesenteric angiograms of the celiac, SMA and IMA without evidence of active GI bleeding. Electronically Signed   By: Jerilynn Mages.  Shick M.D.   On: 06/26/2016 09:54   Ir US Guide Vasc Access Right  Result Date: 06/26/2016 INDICATION: Acute lower GI bleeding requiring transfusions EXAM: ULTRASOUND GUIDANCE FOR VASCULAR ACCESS CELIAC, SMA, AND IMA CATHETERIZATIONS AND ANGIOGRAMS MEDICATIONS: 1% lidocaine locally. ANESTHESIA/SEDATION: Moderate (conscious) sedation was employed during this procedure. A total of Versed 1.0 mg and Fentanyl 25 mcg was administered intravenously. Moderate Sedation Time: 35 minutes. The patient's level of consciousness and vital signs were monitored continuously by radiology nursing throughout the procedure under my direct supervision. CONTRAST:  100 cc Isovue FLUOROSCOPY TIME:  Fluoroscopy Time: 6 minutes 24 seconds (460 mGy). COMPLICATIONS: None immediate. PROCEDURE: Informed consent was obtained from the patient following explanation of the procedure, risks, benefits and alternatives. The patient understands, agrees and consents for the procedure. All questions were addressed. A time out was performed prior to the initiation of the procedure. Maximal barrier sterile technique utilized including caps, mask, sterile gowns, sterile gloves, large sterile drape, hand hygiene, and Betadine prep. Under sterile conditions and local anesthesia, ultrasound micropuncture access performed of the right common femoral artery. 5 French sheath inserted over a Bentson guidewire. Initially, a C2 catheter was utilized to select the SMA. SMA angiogram performed. SMA is widely patent. Jejunal branches throughout the mesentery are patent. No evidence of active bleeding. Replaced right hepatic artery noted off of  the proximal SMA, a normal variant. On the venous phase, the superior mesenteric vein and portal vein are patent. Catheter was exchanged for a Mickelson catheter. This was reformed in the aorta and utilized to select the celiac origin. Celiac angiogram performed. Celiac origin is widely patent. Mild atherosclerotic change. Splenic, left gastric, and left hepatic vasculature are all patent. The gastroduodenal artery and  gastroepiploic artery are visualized and patent. The catheter was retracted and utilized to select the IMA. Selective IMA angiogram performed. IMA is mildly atherosclerotic but patent. The superior hemorrhoidal and left colic branches are all patent. No evidence of active bleeding. On the venous phase, the IMV is patent. Access removed. Hemostasis obtained with the Exoseal device. No immediate complication. Patient tolerated the procedure well. IMPRESSION: Successful mesenteric angiograms of the celiac, SMA and IMA without evidence of active GI bleeding. Electronically Signed   By: Jerilynn Mages.  Shick M.D.   On: 06/26/2016 09:54    Scheduled Meds: . chlorhexidine  15 mL Mouth Rinse BID  . feeding supplement  1 Container Oral TID BM  . fentaNYL      . levothyroxine  112 mcg Oral QAC breakfast  . lidocaine (PF)      . mouth rinse  15 mL Mouth Rinse q12n4p  . midazolam      . omega-3 acid ethyl esters  2 g Oral Daily  . pantoprazole  40 mg Oral QAC breakfast  . pravastatin  20 mg Oral QHS   Continuous Infusions: . sodium chloride 50 mL/hr at 06/26/16 0200    LOS: 3 days   Baldwin, DO [facility] Pager [phone].  If 7PM-7AM, please contact night-coverage [email or web address] Password [facility] 06/26/2016, 3:49 PM

## 2016-06-26 NOTE — Progress Notes (Signed)
CCMD notified RN that patient's HR was 130-140s.  Upon assessment, patient found to be diaphoretic and stating he was hot.  Patient's daughter in room fanning him.  He appears restless and had another bloody bowel movement.  NP notified of elevated HR.  Orders obtained for NS bolus and stat H&H.  Will continue to monitor.

## 2016-06-26 NOTE — Evaluation (Signed)
Physical Therapy Evaluation Patient Details Name: Sean Leonard MRN: [id number] DOB: 04-10-1945 Today's Date: 06/26/2016   History of Present Illness  71 y.o. male with hx of known diverticulosis with colonoscopy by Dr Felipe Drone 3 years ago, hx of Barretts' s/p EGD same time, DDD, hypothyrodism, bilateral iliac artery aneurysm, presented to the ER with BRBPR  Clinical Impression  Patient demonstrates deficits in functional mobility as indicated below. Will need continued skilled PT to address deficits and maximize function. Will see as indicated and progress as tolerated.      Follow Up Recommendations No PT follow up;Supervision - Intermittent    Equipment Recommendations  None recommended by PT    Recommendations for Other Services       Precautions / Restrictions Restrictions Weight Bearing Restrictions: No      Mobility  Bed Mobility Overal bed mobility: Needs Assistance Bed Mobility: Supine to Sit;Sit to Supine     Supine to sit: Supervision Sit to supine: Supervision   General bed mobility comments: supervision for line management, increased time to perform. No physical assist required  Transfers Overall transfer level: Needs assistance Equipment used: None Transfers: Sit to/from Stand Sit to Stand: Min guard         General transfer comment: min guard for safety and stability, no physical assist required  Ambulation/Gait Ambulation/Gait assistance: Min guard Ambulation Distance (Feet): 16 Feet Assistive device: None Gait Pattern/deviations: WFL(Within Functional Limits) Gait velocity: decreased   General Gait Details: patient able to ambulated in room, limited by fatigue. VSS throughout with BP remainins 120s/80s all positions (x3)  Stairs            Wheelchair Mobility    Modified Rankin (Stroke Patients Only)       Balance Overall balance assessment: No apparent balance deficits (not formally assessed)                                            Pertinent Vitals/Pain Pain Assessment: No/denies pain    Home Living Family/patient expects to be discharged to:: Private residence Living Arrangements: Spouse/significant other Available Help at Discharge: Family Type of Home: House Home Access: Stairs to enter Entrance Stairs-Rails: Can reach both Entrance Stairs-Number of Steps: 5 Home Layout: One level        Prior Function Level of Independence: Independent               Hand Dominance   Dominant Hand: Right    Extremity/Trunk Assessment   Upper Extremity Assessment Upper Extremity Assessment: Overall WFL for tasks assessed    Lower Extremity Assessment Lower Extremity Assessment: Overall WFL for tasks assessed       Communication   Communication: No difficulties  Cognition Arousal/Alertness: Awake/alert Behavior During Therapy: WFL for tasks assessed/performed Overall Cognitive Status: Within Functional Limits for tasks assessed                                        General Comments      Exercises     Assessment/Plan    PT Assessment Patient needs continued PT services  PT Problem List Decreased activity tolerance;Decreased balance;Decreased mobility;Cardiopulmonary status limiting activity       PT Treatment Interventions DME instruction;Gait training;Stair training;Functional mobility training;Therapeutic activities;Therapeutic exercise;Balance training;Patient/family education    PT  Goals (Current goals can be found in the Care Plan section)  Acute Rehab PT Goals Patient Stated Goal: to go home PT Goal Formulation: With patient/family Time For Goal Achievement: 07/10/16 Potential to Achieve Goals: Good    Frequency Min 3X/week   Barriers to discharge        Co-evaluation               End of Session   Activity Tolerance: Patient limited by fatigue Patient left: in bed;with call bell/phone within reach;with family/visitor  present Nurse Communication: Mobility status PT Visit Diagnosis: Difficulty in walking, not elsewhere classified (R26.2)    Time: 6568-1275 PT Time Calculation (min) (ACUTE ONLY): 16 min   Charges:   PT Evaluation $PT Eval Moderate Complexity: 1 Procedure     PT G Codes:        Alben Deeds, PT DPT  [place] 06/26/2016, 1:39 PM

## 2016-06-26 NOTE — Consult Note (Signed)
Chief Complaint: Patient was seen in consultation today for mesenteric arteriogram with possible embolization Chief Complaint  Patient presents with  . Rectal Bleeding    this morning  . Abdominal Pain    1 week   at the request of Dr Princella Ion   Supervising Physician: Daryll Brod  Patient Status: [facility]  History of Present Illness: Sean Leonard is a 71 y.o. male   Normal state of health until Sat am Had large bloody BM Fainted later in day Presented to [facility] Has had continued bleeding Known diverticulitis -- followed with Dr Sydell Axon Colonoscopy 4/2: The terminal ileum contained red blood. Findings: A 6 mm polyp was found in the cecum. The polyp was sessile. The polyp was removed with a hot snare. Resection and retrieval were complete. The recto-sigmoid colon, sigmoid colon and descending colon were moderately redundant. Multiple small and large-mouthed diverticula were found in the ascending colon.  Transferred to [facility] last night Red tag study:  IMPRESSION: Inferiorly at level of pelvic brim there is a focus of radiotracer uptake with variable activity likely representing active bleeding within the rectum. No abnormal radiotracer uptake is seen passing through small or large bowel to suggest a more proximal bleed.  Imaging reviewed with Dr Anselm Pancoast Plan for mesenteric arteriogram with possible embolization now   Past Medical History:  Diagnosis Date  . DDD (degenerative disc disease), lumbar   . Diverticula of colon   . Iliac artery aneurysm, bilateral (HCC)    with significant stenosis of right internal iliac artery  . Peptic ulcer   . Prostatitis   . Thyroid disease     Past Surgical History:  Procedure Laterality Date  . COLONOSCOPY  April 2005   Dr. Irving Shows. Small internal hemorrhoids. Multiple small scattered diverticula in the cecum and descending colon.  . COLONOSCOPY N/A 01/18/2014   Procedure: COLONOSCOPY;  Surgeon: Daneil Dolin, MD;   Location: [facility];  Service: Endoscopy;  Laterality: N/A;  1100  . COLONOSCOPY WITH ESOPHAGOGASTRODUODENOSCOPY (EGD) N/A 12/16/2012   Dr. Gala Romney: rectal and colonic polyps with inadequate preparation, tubular adenoma. EGD with biopsy proven short segment Barrett's esophagus, hiatal hernia, mild chronic inactive gastritis   . ESOPHAGOGASTRODUODENOSCOPY  July 2005   Dr. Irving Shows grade 2 esophagitis at the GE junction. Acute gastritis. Multiple acute active, deep, irregular, friable ulcers in the antrum and body of the stomach. 2 active, deep, friable, irregular ulcers in the duodenum. Pathology not available.  . ESOPHAGOGASTRODUODENOSCOPY N/A 01/18/2014   Procedure: ESOPHAGOGASTRODUODENOSCOPY (EGD);  Surgeon: Daneil Dolin, MD;  Location: [facility];  Service: Endoscopy;  Laterality: N/A;  . THYROIDECTOMY    . TONSILLECTOMY      Allergies: Patient has no known allergies.  Medications: Prior to Admission medications   Medication Sig Start Date End Date Taking? Authorizing Provider  fish oil-omega-3 fatty acids 1000 MG capsule Take 2 g by mouth daily.   Yes Historical Provider, MD  levothyroxine (SYNTHROID, LEVOTHROID) 112 MCG tablet Take 112 mcg by mouth every morning.   Yes Historical Provider, MD  Multiple Vitamin (MULTIVITAMIN WITH MINERALS) TABS tablet Take 1 tablet by mouth daily.   Yes Historical Provider, MD  omeprazole (PRILOSEC) 20 MG capsule TAKE 1 CAPSULE EVERY DAY 12/27/15  Yes Mahala Menghini, PA-C  pravastatin (PRAVACHOL) 20 MG tablet Take 20 mg by mouth at bedtime.   Yes Historical Provider, MD     Family History  Problem Relation Age of Onset  .  Hypertension Mother   . Heart disease Mother     before age 28  . Heart disease Father     before age 42  . Hyperlipidemia Sister   . Cancer Sister   . Colon cancer Neg Hx   . Liver disease Neg Hx     Social History   Social History  . Marital status: Married    Spouse name: N/A  . Number of children: N/A  .  Years of education: N/A   Social History Main Topics  . Smoking status: Former Smoker    Years: 25.00    Types: Cigars    Quit date: 11/27/2006  . Smokeless tobacco: Never Used     Comment: Quit in 2009  . Alcohol use No     Comment: former. weekend drinker (liqour) before, May 2015 last time he drank during a Therapist, nutritional.   . Drug use: No  . Sexual activity: Not Asked   Other Topics Concern  . None   Social History Narrative   1 deceased child. 1 living    Review of Systems: A 12 point ROS discussed and pertinent positives are indicated in the HPI above.  All other systems are negative.  Review of Systems  Constitutional: Positive for activity change, appetite change and fatigue. Negative for fever.  Respiratory: Negative for cough and shortness of breath.   Cardiovascular: Negative for chest pain.  Gastrointestinal: Positive for abdominal pain, anal bleeding and blood in stool.  Musculoskeletal: Negative for back pain.  Neurological: Positive for weakness.  Psychiatric/Behavioral: Negative for behavioral problems and confusion.    Vital Signs: BP 113/73 (BP Location: Right Arm)   Pulse 90   Temp 99 F (37.2 C) (Oral)   Resp (!) 21   Ht 6\' 2"  (1.88 m)   Wt 178 lb 2.1 oz (80.8 kg)   SpO2 100%   BMI 22.87 kg/m   Physical Exam  Constitutional: He is oriented to person, place, and time. He appears well-nourished.  Cardiovascular: Normal rate, regular rhythm and normal heart sounds.   Pulmonary/Chest: Effort normal and breath sounds normal.  Abdominal: Soft. Bowel sounds are normal. There is no tenderness.  Musculoskeletal: Normal range of motion.  Neurological: He is alert and oriented to person, place, and time.  Skin: Skin is warm and dry.  Psychiatric: He has a normal mood and affect. His behavior is normal. Judgment and thought content normal.  Nursing note and vitals reviewed.   Mallampati Score:  MD Evaluation Airway: WNL Heart: WNL Abdomen: WNL Chest/  Lungs: WNL ASA  Classification: 3 Mallampati/Airway Score: One  Imaging: Ct Head Wo Contrast  Result Date: 06/23/2016 CLINICAL DATA:  Code stroke, LEFT facial and LEFT arm numbness, rectal bleeding, history ulcer disease, colonic diverticulosis EXAM: CT HEAD WITHOUT CONTRAST TECHNIQUE: Contiguous axial images were obtained from the base of the skull through the vertex without intravenous contrast. Sagittal and coronal MPR images reconstructed from axial data set. COMPARISON:  None FINDINGS: Brain: Mild generalized atrophy. Normal ventricular morphology. No midline shift or mass effect. Otherwise normal appearance of brain parenchyma. No intracranial hemorrhage, mass lesion or evidence acute infarction. No extra-axial fluid collections. Vascular: Unremarkable Skull: Intact Sinuses/Orbits: Clear Other: N/A IMPRESSION: No acute intracranial abnormality. Findings called to [facility] in ICU on 06/23/2016 at 1620 hours. Electronically Signed   By: Lavonia Dana M.D.   On: 06/23/2016 16:23   Nm Gi Blood Loss  Result Date: 06/26/2016 CLINICAL DATA:  71 y/o M; bright red  blood per rectum and lower abdominal pain for several days. Bloody stools 1.5 hours prior to imaging. EXAM: NUCLEAR MEDICINE GASTROINTESTINAL BLEEDING SCAN TECHNIQUE: Sequential abdominal images were obtained following intravenous administration of Tc-53m labeled red blood cells. RADIOPHARMACEUTICALS:  20.7 mCi Tc-59m in-vitro labeled red cells. COMPARISON:  06/22/2016 CT of the abdomen and pelvis. FINDINGS: On dynamic imaging there is progressive radiotracer accumulation within the bladder. Inferiorly at the level of the pelvic brim there is a second focus of radiotracer uptake with variable activity. No abnormal radiotracer uptake is seen passing through small or large bowel within the abdomen. IMPRESSION: Inferiorly at level of pelvic brim there is a focus of radiotracer uptake with variable activity likely representing active bleeding within the  rectum. No abnormal radiotracer uptake is seen passing through small or large bowel to suggest a more proximal bleed. Electronically Signed   By: Kristine Garbe M.D.   On: 06/26/2016 04:42   Ct Abdomen Pelvis W Contrast  Result Date: 06/22/2016 CLINICAL DATA:  Acute onset of severe generalized abdominal pain. Bright red rectal bleeding. Diaphoresis. Initial encounter. EXAM: CT ABDOMEN AND PELVIS WITH CONTRAST TECHNIQUE: Multidetector CT imaging of the abdomen and pelvis was performed using the standard protocol following bolus administration of intravenous contrast. CONTRAST:  168mL ISOVUE-300 IOPAMIDOL (ISOVUE-300) INJECTION 61% COMPARISON:  CT of the abdomen and pelvis from 11/22/2014 FINDINGS: Lower chest: Minimal bibasilar atelectasis or scarring is noted. Scattered coronary artery calcifications are seen. Hepatobiliary: The liver is unremarkable in appearance. The gallbladder is unremarkable in appearance. The common bile duct remains normal in caliber. Pancreas: There is mild dilatation of the pancreatic duct to 4-5 mm in maximal diameter, of uncertain significance. The pancreas is otherwise unremarkable. Spleen: The spleen is unremarkable in appearance. Adrenals/Urinary Tract: The adrenal glands are unremarkable in appearance. Small bilateral renal cysts are seen. Nonspecific perinephric stranding is noted bilaterally. There is no evidence of hydronephrosis. No renal or ureteral stones are identified. Stomach/Bowel: The stomach is unremarkable in appearance. The small bowel is within normal limits. The appendix is not visualized; there is no evidence for appendicitis. Mild diverticulosis is noted along the ascending colon. Mildly prominent vasculature is noted extending along the rectal wall, raising concern for internal hemorrhoids. Vascular/Lymphatic: Scattered calcification is seen along the abdominal aorta and its branches. Mild mural thrombus is seen along the common iliac arteries  bilaterally. The abdominal aorta is otherwise grossly unremarkable. The inferior vena cava is grossly unremarkable. No retroperitoneal lymphadenopathy is seen. No pelvic sidewall lymphadenopathy is identified. Reproductive: The bladder is mildly distended and grossly unremarkable. The prostate is enlarged, measuring 5.3 cm in transverse dimension. Other: No additional soft tissue abnormalities are seen. Musculoskeletal: No acute osseous abnormalities are identified. Multilevel vacuum phenomenon is noted along the lumbar spine, with endplate sclerotic change. The visualized musculature is unremarkable in appearance. IMPRESSION: 1. Mildly prominent vasculature extending along the rectal wall, raising concern for internal hemorrhoids. 2. Mild dilatation of the pancreatic duct to 4-5 mm in maximal diameter, of uncertain significance. Pancreas otherwise unremarkable in appearance. This appears relatively stable from 2016 and may reflect the patient's baseline. 3. Scattered bilateral renal cysts. 4. Mild diverticulosis along the ascending colon, without diverticulitis. 5. Scattered aortic atherosclerosis. 6. Enlarged prostate noted. 7. Degenerative change along the lumbar spine. Electronically Signed   By: Garald Balding M.D.   On: 06/22/2016 22:39   Dg Chest Port 1 View  Result Date: 06/22/2016 CLINICAL DATA:  Abdominal pain and rectal bleeding. EXAM: PORTABLE CHEST 1 VIEW  COMPARISON:  Chest radiograph 11/15/2014 FINDINGS: Lower lung volumes from prior exam. Stable elevation of left hemidiaphragm. Heart size and mediastinal contours are unchanged, borderline cardiomegaly. No pulmonary edema, large pleural effusion or pneumothorax. Stable osseous structures. Air within the stomach in the left upper quadrant. No obvious free air under the hemidiaphragm. Abdominal CT is planned. IMPRESSION: Borderline cardiomegaly.  Chronic elevation of left hemidiaphragm. Electronically Signed   By: Jeb Levering M.D.   On:  06/22/2016 21:24    Labs:  CBC:  Recent Labs  06/24/16 1226 06/24/16 1748 06/25/16 0410 06/25/16 1844 06/25/16 2138  WBC 13.4* 13.7* 13.3* 15.2*  --   HGB 8.1* 9.2* 6.7* 7.6* 5.3*  HCT 23.8* 26.4* 20.3* 21.8* 15.1*  PLT 146* 136* 131* 110*  --     COAGS:  Recent Labs  06/22/16 2050  INR 0.97    BMP:  Recent Labs  06/22/16 2050 06/22/16 2052 06/23/16 1307 06/24/16 0423  NA 135 137 139 137  K 3.0* 5.6* 3.5 3.7  CL 102 107 111 111  CO2 24  --  23 23  GLUCOSE 167* 146* 148* 128*  BUN 18 24* 13 10  CALCIUM 8.7*  --  8.1* 7.8*  CREATININE 1.27* 1.20 0.83 0.71  GFRNONAA 56*  --  >60 >60  GFRAA >60  --  >60 >60    LIVER FUNCTION TESTS:  Recent Labs  06/22/16 2050 06/23/16 1307  BILITOT 0.5 0.7  AST 18 20  ALT 16* 13*  ALKPHOS 50 38  PROT 6.5 5.2*  ALBUMIN 3.6 2.9*    TUMOR MARKERS: No results for input(s): AFPTM, CEA, CA199, CHROMGRNA in the last 8760 hours.  Assessment and Plan:  GI bleed Bleeding continues Red tag cell study + Now for mesenteric arteriogram with possible embolization Risks and Benefits discussed with the patient including, but not limited to bleeding, infection, vascular injury or contrast induced renal failure. All of the patient's questions were answered, patient is agreeable to proceed. Consent signed and in chart.  Thank you for this interesting consult.  I greatly enjoyed meeting JAMYRON REDD and look forward to participating in their care.  A copy of this report was sent to the requesting provider on this date.  Electronically Signed: Monia Sabal A 06/26/2016, 7:17 AM   I spent a total of 40 Minutes    in face to face in clinical consultation, greater than 50% of which was counseling/coordinating care for mesenteric arteriogram with embo

## 2016-06-26 NOTE — Procedures (Signed)
Acute GI bleed  s/p celiac, sma, and ima angios  No comp Stable Neg for acute bleeding EBL less than 5cc Full report in PACS

## 2016-06-26 NOTE — Sedation Documentation (Signed)
5 Fr. Exoseal to right groin 

## 2016-06-26 NOTE — Consult Note (Signed)
[facility] Surgery Consult Note  Sean Leonard 1945-06-03  [id number].    Requesting MD: Eliseo Squires Chief Complaint/Reason for Consult: Lower GI bleed  HPI:  Sean Leonard is a 71yo male admitted to [facility] 3/31 via transfer from [facility] with acute lower GI bleed. His abdominal pain began 3/31 and gradually got worse. Pain is in the lower abdomen, was constant and crampy in nature. He reports having daily bright red bloody BM's since that time. Each time he has a BM he feels lightheaded. States that he had never had this before.  Hospital workup: - CT scan 3/31 showed mildly prominent vasculature extending along the rectal wall, raising concern for internal hemorrhoids; mild diverticulosis along the ascending colon, without diverticulitis - colonoscopy at [facility] 4/2: report says bleeding noted in terminal ileum, rectal bleeding most likely due to ascending colon diverticulosis -  Red tag cell study 4/4 showed possible bleeding at the pelvic brim/rectum - 4/4 mesenteric angiograms of the celiac, SMA and IMA without evidence of active GI bleeding - last bloody BM 4/3 PM - latest Hg 7.7, set to recheck again this afternoon  Currently the patient denies any abdominal pain, nausea, or vomiting. Last bloody BM was yesterday evening.   -PMH significant for Diverticulosis, Barrett's esophagus, GERD, DDD, hypothyroidism, bilateral iliac artery aneurysm  -Abdominal surgical history: none -Prior to admission last colonoscopy 12/2013 by Dr. Gala Romney with removal of 2 small hypoplastic rectal polyps, also found to have colonic diverticulosis; upper endo at the same time -Nonsmoker -Employment: retired, used to make cigarettes  ROS: Review of Systems  Constitutional: Negative.   HENT: Negative.   Eyes: Negative.   Respiratory: Negative.   Cardiovascular: Negative.   Gastrointestinal: Positive for abdominal pain and blood in stool. Negative for constipation, heartburn, nausea and  vomiting.  Genitourinary: Negative.   Musculoskeletal: Negative.   Skin: Negative.   Neurological: Positive for dizziness.  All systems reviewed and otherwise negative except for as above  Family History  Problem Relation Age of Onset  . Hypertension Mother   . Heart disease Mother     before age 45  . Heart disease Father     before age 9  . Hyperlipidemia Sister   . Cancer Sister   . Colon cancer Neg Hx   . Liver disease Neg Hx     Past Medical History:  Diagnosis Date  . DDD (degenerative disc disease), lumbar   . Diverticula of colon   . Iliac artery aneurysm, bilateral (HCC)    with significant stenosis of right internal iliac artery  . Peptic ulcer   . Prostatitis   . Thyroid disease     Past Surgical History:  Procedure Laterality Date  . COLONOSCOPY  April 2005   Dr. Irving Shows. Small internal hemorrhoids. Multiple small scattered diverticula in the cecum and descending colon.  . COLONOSCOPY N/A 01/18/2014   Procedure: COLONOSCOPY;  Surgeon: Daneil Dolin, MD;  [facility]: [facility];  Service: Endoscopy;  Laterality: N/A;  1100  . COLONOSCOPY N/A 06/23/2016   Procedure: COLONOSCOPY;  Surgeon: Rogene Houston, MD;  Location: [facility];  Service: Endoscopy;  Laterality: N/A;  . COLONOSCOPY WITH ESOPHAGOGASTRODUODENOSCOPY (EGD) N/A 12/16/2012   Dr. Gala Romney: rectal and colonic polyps with inadequate preparation, tubular adenoma. EGD with biopsy proven short segment Barrett's esophagus, hiatal hernia, mild chronic inactive gastritis   . COLONOSCOPY WITH PROPOFOL N/A 06/24/2016   Procedure: COLONOSCOPY WITH PROPOFOL;  Surgeon: Danie Binder, MD;  Location: [facility];  Service: Endoscopy;  Laterality: N/A;  . ESOPHAGOGASTRODUODENOSCOPY  July 2005   Dr. Irving Shows grade 2 esophagitis at the GE junction. Acute gastritis. Multiple acute active, deep, irregular, friable ulcers in the antrum and body of the stomach. 2 active, deep, friable, irregular ulcers in the  duodenum. Pathology not available.  . ESOPHAGOGASTRODUODENOSCOPY N/A 01/18/2014   Procedure: ESOPHAGOGASTRODUODENOSCOPY (EGD);  Surgeon: Daneil Dolin, MD;  [facility];  Service: Endoscopy;  Laterality: N/A;  . IR ANGIOGRAM VISCERAL SELECTIVE  06/26/2016  . IR ANGIOGRAM VISCERAL SELECTIVE  06/26/2016  . IR ANGIOGRAM VISCERAL SELECTIVE  06/26/2016  . IR US GUIDE VASC ACCESS RIGHT  06/26/2016  . POLYPECTOMY  06/24/2016   Procedure: POLYPECTOMY;  Surgeon: Danie Binder, MD;  Location: [facility];  Service: Endoscopy;;  cecal  . THYROIDECTOMY    . TONSILLECTOMY      Social History:  reports that he quit smoking about 9 years ago. His smoking use included Cigars. He quit after 25.00 years of use. He has never used smokeless tobacco. He reports that he does not drink alcohol or use drugs.  Allergies: No Known Allergies  Medications Prior to Admission  Medication Sig Dispense Refill  . fish oil-omega-3 fatty acids 1000 MG capsule Take 2 g by mouth daily.    Marland Kitchen levothyroxine (SYNTHROID, LEVOTHROID) 112 MCG tablet Take 112 mcg by mouth every morning.    . Multiple Vitamin (MULTIVITAMIN WITH MINERALS) TABS tablet Take 1 tablet by mouth daily.    Marland Kitchen omeprazole (PRILOSEC) 20 MG capsule TAKE 1 CAPSULE EVERY DAY 90 capsule 0  . pravastatin (PRAVACHOL) 20 MG tablet Take 20 mg by mouth at bedtime.      Prior to Admission medications   Medication Sig Start Date End Date Taking? Authorizing Provider  fish oil-omega-3 fatty acids 1000 MG capsule Take 2 g by mouth daily.   Yes Historical Provider, MD  levothyroxine (SYNTHROID, LEVOTHROID) 112 MCG tablet Take 112 mcg by mouth every morning.   Yes Historical Provider, MD  Multiple Vitamin (MULTIVITAMIN WITH MINERALS) TABS tablet Take 1 tablet by mouth daily.   Yes Historical Provider, MD  omeprazole (PRILOSEC) 20 MG capsule TAKE 1 CAPSULE EVERY DAY 12/27/15  Yes Mahala Menghini, PA-C  pravastatin (PRAVACHOL) 20 MG tablet Take 20 mg by mouth at bedtime.    Yes Historical Provider, MD    Blood pressure 111/62, pulse 82, temperature 98.7 F (37.1 C), temperature source Axillary, resp. rate (!) 0, height 6\' 2"  (1.88 m), weight 178 lb 2.1 oz (80.8 kg), SpO2 100 %. Physical Exam: General: pleasant, WD/WN AA male who is laying in bed in NAD HEENT: head is normocephalic, atraumatic.  Sclera are noninjected.  PERRL.  Ears and nose without any masses or lesions.  Mouth is pink and moist. Good dentition. Heart: regular, rate, and rhythm.  No obvious murmurs, gallops, or rubs noted.  Palpable pedal pulses bilaterally Lungs: CTAB, no wheezes, rhonchi, or rales noted.  Respiratory effort nonlabored Abd: no previous incisions seen, soft, NT/ND, +BS, no masses, hernias, or organomegaly MS: all 4 extremities are symmetrical with no cyanosis, clubbing, or edema. Skin: warm and dry with no masses, lesions, or rashes Psych: A&Ox3 with an appropriate affect. Neuro: CM 2-12 intact, extremity CSM intact bilaterally, normal speech  Results for orders placed or performed during the hospital encounter of 06/22/16 (from the past 48 hour(s))  CBC     Status: Abnormal   Collection Time: 06/24/16  5:48 PM  Result Value Ref Range   WBC 13.7 (H) 4.0 - 10.5 K/uL   RBC 3.06 (L) 4.22 - 5.81 MIL/uL   Hemoglobin 9.2 (L) 13.0 - 17.0 g/dL   HCT 26.4 (L) 39.0 - 52.0 %   MCV 86.3 78.0 - 100.0 fL   MCH 30.1 26.0 - 34.0 pg   MCHC 34.8 30.0 - 36.0 g/dL   RDW 14.8 11.5 - 15.5 %   Platelets 136 (L) 150 - 400 K/uL  CBC     Status: Abnormal   Collection Time: 06/25/16  4:10 AM  Result Value Ref Range   WBC 13.3 (H) 4.0 - 10.5 K/uL   RBC 2.27 (L) 4.22 - 5.81 MIL/uL   Hemoglobin 6.7 (LL) 13.0 - 17.0 g/dL    Comment: CRITICAL RESULT CALLED TO, READ BACK BY AND VERIFIED WITH: FREEMAN L ON 0403 AT 0620 BY HAMLETT P    HCT 20.3 (L) 39.0 - 52.0 %   MCV 89.4 78.0 - 100.0 fL   MCH 29.5 26.0 - 34.0 pg   MCHC 33.0 30.0 - 36.0 g/dL   RDW 15.0 11.5 - 15.5 %   Platelets 131 (L) 150 -  400 K/uL  Glucose, capillary     Status: Abnormal   Collection Time: 06/25/16  2:16 PM  Result Value Ref Range   Glucose-Capillary 108 (H) 65 - 99 mg/dL  Type and screen [place]     Status: None (Preliminary result)   Collection Time: 06/25/16  6:44 PM  Result Value Ref Range   ABO/RH(D) AB POS    Antibody Screen NEG    Sample Expiration 06/28/2016    Unit Number [id number]    Blood Component Type RED CELLS,LR    Unit division 00    Status of Unit ISSUED    Transfusion Status OK TO TRANSFUSE    Crossmatch Result Compatible    Unit Number [id number]    Blood Component Type RED CELLS,LR    Unit division 00    Status of Unit REL FROM [facility]    Transfusion Status OK TO TRANSFUSE    Crossmatch Result Compatible    Unit Number [id number]    Blood Component Type RED CELLS,LR    Unit division 00    Status of Unit ISSUED,FINAL    Transfusion Status OK TO TRANSFUSE    Crossmatch Result Compatible    Unit Number [id number]    Blood Component Type RED CELLS,LR    Unit division 00    Status of Unit ISSUED    Transfusion Status OK TO TRANSFUSE    Crossmatch Result Compatible   CBC     Status: Abnormal   Collection Time: 06/25/16  6:44 PM  Result Value Ref Range   WBC 15.2 (H) 4.0 - 10.5 K/uL    Comment: REPEATED TO VERIFY WHITE COUNT CONFIRMED ON SMEAR    RBC 2.52 (L) 4.22 - 5.81 MIL/uL   Hemoglobin 7.6 (L) 13.0 - 17.0 g/dL   HCT 21.8 (L) 39.0 - 52.0 %   MCV 86.5 78.0 - 100.0 fL   MCH 30.2 26.0 - 34.0 pg   MCHC 34.9 30.0 - 36.0 g/dL   RDW 14.9 11.5 - 15.5 %   Platelets 110 (L) 150 - 400 K/uL    Comment: PLATELET COUNT CONFIRMED BY SMEAR  ABO/Rh     Status: None   Collection Time: 06/25/16  6:44 PM  Result Value Ref Range   ABO/RH(D) AB POS   Hemoglobin  and hematocrit, blood     Status: Abnormal   Collection Time: 06/25/16  9:38 PM  Result Value Ref Range   Hemoglobin 5.3 (LL) 13.0 - 17.0 g/dL    Comment: REPEATED TO VERIFY CRITICAL RESULT  CALLED TO, READ BACK BY AND VERIFIED WITH: Thompson Caul RN AT 2231 ON 04.03.2018 BY COCHRANE S    HCT 15.1 (L) 39.0 - 52.0 %  Prepare RBC     Status: None   Collection Time: 06/25/16 10:27 PM  Result Value Ref Range   Order Confirmation ORDER PROCESSED BY BLOOD BANK   Prepare fresh frozen plasma     Status: None (Preliminary result)   Collection Time: 06/25/16 10:35 PM  Result Value Ref Range   Unit Number [id number]    Blood Component Type THWPLS APHR1    Unit division 00    Status of Unit REL FROM [facility]    Transfusion Status OK TO TRANSFUSE    Unit Number [id number]    Blood Component Type THWPLS APHR3    Unit division 00    Status of Unit REL FROM [facility]    Transfusion Status OK TO TRANSFUSE    Unit Number [id number]    Blood Component Type THWPLS APHR1    Unit division 00    Status of Unit ISSUED    Transfusion Status OK TO TRANSFUSE    Unit Number [id number]    Blood Component Type THWPLS APHR1    Unit division 00    Status of Unit ISSUED    Transfusion Status OK TO TRANSFUSE   CBC     Status: Abnormal   Collection Time: 06/26/16  9:54 AM  Result Value Ref Range   WBC 14.1 (H) 4.0 - 10.5 K/uL    Comment: WHITE COUNT CONFIRMED ON SMEAR   RBC 2.51 (L) 4.22 - 5.81 MIL/uL   Hemoglobin 7.7 (L) 13.0 - 17.0 g/dL    Comment: POST TRANSFUSION SPECIMEN   HCT 21.8 (L) 39.0 - 52.0 %   MCV 86.9 78.0 - 100.0 fL   MCH 30.7 26.0 - 34.0 pg   MCHC 35.3 30.0 - 36.0 g/dL   RDW 14.5 11.5 - 15.5 %   Platelets 90 (L) 150 - 400 K/uL    Comment: PLATELET COUNT CONFIRMED BY SMEAR   Nm Gi Blood Loss  Result Date: 06/26/2016 CLINICAL DATA:  71 y/o M; bright red blood per rectum and lower abdominal pain for several days. Bloody stools 1.5 hours prior to imaging. EXAM: NUCLEAR MEDICINE GASTROINTESTINAL BLEEDING SCAN TECHNIQUE: Sequential abdominal images were obtained following intravenous administration of Tc-33m labeled red blood cells. RADIOPHARMACEUTICALS:  20.7 mCi Tc-61m in-vitro  labeled red cells. COMPARISON:  06/22/2016 CT of the abdomen and pelvis. FINDINGS: On dynamic imaging there is progressive radiotracer accumulation within the bladder. Inferiorly at the level of the pelvic brim there is a second focus of radiotracer uptake with variable activity. No abnormal radiotracer uptake is seen passing through small or large bowel within the abdomen. IMPRESSION: Inferiorly at level of pelvic brim there is a focus of radiotracer uptake with variable activity likely representing active bleeding within the rectum. No abnormal radiotracer uptake is seen passing through small or large bowel to suggest a more proximal bleed. Electronically Signed   By: Kristine Garbe M.D.   On: 06/26/2016 04:42   Ir Angiogram Visceral Selective  Result Date: 06/26/2016 INDICATION: Acute lower GI bleeding requiring transfusions EXAM: ULTRASOUND GUIDANCE FOR VASCULAR ACCESS CELIAC, SMA, AND IMA CATHETERIZATIONS AND ANGIOGRAMS MEDICATIONS:  1% lidocaine locally. ANESTHESIA/SEDATION: Moderate (conscious) sedation was employed during this procedure. A total of Versed 1.0 mg and Fentanyl 25 mcg was administered intravenously. Moderate Sedation Time: 35 minutes. The patient's level of consciousness and vital signs were monitored continuously by radiology nursing throughout the procedure under my direct supervision. CONTRAST:  100 cc Isovue FLUOROSCOPY TIME:  Fluoroscopy Time: 6 minutes 24 seconds (460 mGy). COMPLICATIONS: None immediate. PROCEDURE: Informed consent was obtained from the patient following explanation of the procedure, risks, benefits and alternatives. The patient understands, agrees and consents for the procedure. All questions were addressed. A time out was performed prior to the initiation of the procedure. Maximal barrier sterile technique utilized including caps, mask, sterile gowns, sterile gloves, large sterile drape, hand hygiene, and Betadine prep. Under sterile conditions and local  anesthesia, ultrasound micropuncture access performed of the right common femoral artery. 5 French sheath inserted over a Bentson guidewire. Initially, a C2 catheter was utilized to select the SMA. SMA angiogram performed. SMA is widely patent. Jejunal branches throughout the mesentery are patent. No evidence of active bleeding. Replaced right hepatic artery noted off of the proximal SMA, a normal variant. On the venous phase, the superior mesenteric vein and portal vein are patent. Catheter was exchanged for a Mickelson catheter. This was reformed in the aorta and utilized to select the celiac origin. Celiac angiogram performed. Celiac origin is widely patent. Mild atherosclerotic change. Splenic, left gastric, and left hepatic vasculature are all patent. The gastroduodenal artery and gastroepiploic artery are visualized and patent. The catheter was retracted and utilized to select the IMA. Selective IMA angiogram performed. IMA is mildly atherosclerotic but patent. The superior hemorrhoidal and left colic branches are all patent. No evidence of active bleeding. On the venous phase, the IMV is patent. Access removed. Hemostasis obtained with the Exoseal device. No immediate complication. Patient tolerated the procedure well. IMPRESSION: Successful mesenteric angiograms of the celiac, SMA and IMA without evidence of active GI bleeding. Electronically Signed   By: Jerilynn Mages.  Shick M.D.   On: 06/26/2016 09:54   Ir Angiogram Visceral Selective  Result Date: 06/26/2016 INDICATION: Acute lower GI bleeding requiring transfusions EXAM: ULTRASOUND GUIDANCE FOR VASCULAR ACCESS CELIAC, SMA, AND IMA CATHETERIZATIONS AND ANGIOGRAMS MEDICATIONS: 1% lidocaine locally. ANESTHESIA/SEDATION: Moderate (conscious) sedation was employed during this procedure. A total of Versed 1.0 mg and Fentanyl 25 mcg was administered intravenously. Moderate Sedation Time: 35 minutes. The patient's level of consciousness and vital signs were monitored  continuously by radiology nursing throughout the procedure under my direct supervision. CONTRAST:  100 cc Isovue FLUOROSCOPY TIME:  Fluoroscopy Time: 6 minutes 24 seconds (460 mGy). COMPLICATIONS: None immediate. PROCEDURE: Informed consent was obtained from the patient following explanation of the procedure, risks, benefits and alternatives. The patient understands, agrees and consents for the procedure. All questions were addressed. A time out was performed prior to the initiation of the procedure. Maximal barrier sterile technique utilized including caps, mask, sterile gowns, sterile gloves, large sterile drape, hand hygiene, and Betadine prep. Under sterile conditions and local anesthesia, ultrasound micropuncture access performed of the right common femoral artery. 5 French sheath inserted over a Bentson guidewire. Initially, a C2 catheter was utilized to select the SMA. SMA angiogram performed. SMA is widely patent. Jejunal branches throughout the mesentery are patent. No evidence of active bleeding. Replaced right hepatic artery noted off of the proximal SMA, a normal variant. On the venous phase, the superior mesenteric vein and portal vein are patent. Catheter was exchanged for a  Mickelson catheter. This was reformed in the aorta and utilized to select the celiac origin. Celiac angiogram performed. Celiac origin is widely patent. Mild atherosclerotic change. Splenic, left gastric, and left hepatic vasculature are all patent. The gastroduodenal artery and gastroepiploic artery are visualized and patent. The catheter was retracted and utilized to select the IMA. Selective IMA angiogram performed. IMA is mildly atherosclerotic but patent. The superior hemorrhoidal and left colic branches are all patent. No evidence of active bleeding. On the venous phase, the IMV is patent. Access removed. Hemostasis obtained with the Exoseal device. No immediate complication. Patient tolerated the procedure well. IMPRESSION:  Successful mesenteric angiograms of the celiac, SMA and IMA without evidence of active GI bleeding. Electronically Signed   By: Jerilynn Mages.  Shick M.D.   On: 06/26/2016 09:54   Ir Angiogram Visceral Selective  Result Date: 06/26/2016 INDICATION: Acute lower GI bleeding requiring transfusions EXAM: ULTRASOUND GUIDANCE FOR VASCULAR ACCESS CELIAC, SMA, AND IMA CATHETERIZATIONS AND ANGIOGRAMS MEDICATIONS: 1% lidocaine locally. ANESTHESIA/SEDATION: Moderate (conscious) sedation was employed during this procedure. A total of Versed 1.0 mg and Fentanyl 25 mcg was administered intravenously. Moderate Sedation Time: 35 minutes. The patient's level of consciousness and vital signs were monitored continuously by radiology nursing throughout the procedure under my direct supervision. CONTRAST:  100 cc Isovue FLUOROSCOPY TIME:  Fluoroscopy Time: 6 minutes 24 seconds (460 mGy). COMPLICATIONS: None immediate. PROCEDURE: Informed consent was obtained from the patient following explanation of the procedure, risks, benefits and alternatives. The patient understands, agrees and consents for the procedure. All questions were addressed. A time out was performed prior to the initiation of the procedure. Maximal barrier sterile technique utilized including caps, mask, sterile gowns, sterile gloves, large sterile drape, hand hygiene, and Betadine prep. Under sterile conditions and local anesthesia, ultrasound micropuncture access performed of the right common femoral artery. 5 French sheath inserted over a Bentson guidewire. Initially, a C2 catheter was utilized to select the SMA. SMA angiogram performed. SMA is widely patent. Jejunal branches throughout the mesentery are patent. No evidence of active bleeding. Replaced right hepatic artery noted off of the proximal SMA, a normal variant. On the venous phase, the superior mesenteric vein and portal vein are patent. Catheter was exchanged for a Mickelson catheter. This was reformed in the aorta  and utilized to select the celiac origin. Celiac angiogram performed. Celiac origin is widely patent. Mild atherosclerotic change. Splenic, left gastric, and left hepatic vasculature are all patent. The gastroduodenal artery and gastroepiploic artery are visualized and patent. The catheter was retracted and utilized to select the IMA. Selective IMA angiogram performed. IMA is mildly atherosclerotic but patent. The superior hemorrhoidal and left colic branches are all patent. No evidence of active bleeding. On the venous phase, the IMV is patent. Access removed. Hemostasis obtained with the Exoseal device. No immediate complication. Patient tolerated the procedure well. IMPRESSION: Successful mesenteric angiograms of the celiac, SMA and IMA without evidence of active GI bleeding. Electronically Signed   By: Jerilynn Mages.  Shick M.D.   On: 06/26/2016 09:54   Ir US Guide Vasc Access Right  Result Date: 06/26/2016 INDICATION: Acute lower GI bleeding requiring transfusions EXAM: ULTRASOUND GUIDANCE FOR VASCULAR ACCESS CELIAC, SMA, AND IMA CATHETERIZATIONS AND ANGIOGRAMS MEDICATIONS: 1% lidocaine locally. ANESTHESIA/SEDATION: Moderate (conscious) sedation was employed during this procedure. A total of Versed 1.0 mg and Fentanyl 25 mcg was administered intravenously. Moderate Sedation Time: 35 minutes. The patient's level of consciousness and vital signs were monitored continuously by radiology nursing throughout the procedure under  my direct supervision. CONTRAST:  100 cc Isovue FLUOROSCOPY TIME:  Fluoroscopy Time: 6 minutes 24 seconds (460 mGy). COMPLICATIONS: None immediate. PROCEDURE: Informed consent was obtained from the patient following explanation of the procedure, risks, benefits and alternatives. The patient understands, agrees and consents for the procedure. All questions were addressed. A time out was performed prior to the initiation of the procedure. Maximal barrier sterile technique utilized including caps, mask,  sterile gowns, sterile gloves, large sterile drape, hand hygiene, and Betadine prep. Under sterile conditions and local anesthesia, ultrasound micropuncture access performed of the right common femoral artery. 5 French sheath inserted over a Bentson guidewire. Initially, a C2 catheter was utilized to select the SMA. SMA angiogram performed. SMA is widely patent. Jejunal branches throughout the mesentery are patent. No evidence of active bleeding. Replaced right hepatic artery noted off of the proximal SMA, a normal variant. On the venous phase, the superior mesenteric vein and portal vein are patent. Catheter was exchanged for a Mickelson catheter. This was reformed in the aorta and utilized to select the celiac origin. Celiac angiogram performed. Celiac origin is widely patent. Mild atherosclerotic change. Splenic, left gastric, and left hepatic vasculature are all patent. The gastroduodenal artery and gastroepiploic artery are visualized and patent. The catheter was retracted and utilized to select the IMA. Selective IMA angiogram performed. IMA is mildly atherosclerotic but patent. The superior hemorrhoidal and left colic branches are all patent. No evidence of active bleeding. On the venous phase, the IMV is patent. Access removed. Hemostasis obtained with the Exoseal device. No immediate complication. Patient tolerated the procedure well. IMPRESSION: Successful mesenteric angiograms of the celiac, SMA and IMA without evidence of active GI bleeding. Electronically Signed   By: Jerilynn Mages.  Shick M.D.   On: 06/26/2016 09:54   Anti-infectives    None        Assessment/Plan Lower GI bleed - known h/o colonic diverticulosis per colonoscopy 12/2013, but no prior h/o GI bleed or diverticulitis - no previous abdominal surgical history - BRBPR began 3/31 - CT scan 3/31 showed mildly prominent vasculature extending along the rectal wall, raising concern for internal hemorrhoids; mild diverticulosis along the  ascending colon, without diverticulitis - colonoscopy at [facility] 4/2: report says bleeding noted in terminal ileum, rectal bleeding most likely due to ascending colon diverticulosis -  Red tag cell study 4/4 showed possible bleeding at the pelvic brim/rectum - 4/4 mesenteric angiograms of the celiac, SMA and IMA without evidence of active GI bleeding - last bloody BM 4/3 PM - latest Hg 7.7, set to recheck again this afternoon  Diverticulosis Barrett's esophagus DDD Hypothyroidism Bilateral iliac artery aneurysm   ID - none VTE - SCDs FEN - IVF, NPO except sips/chips  Plan - Patient is hemodynamically stable and not actively bleeding, therefore there is no rule for acute surgical intervention. There seems to be some confusion as to where the bleeding is actually coming from. Colonoscopy report from 4/2 at [facility] reports blood in the terminal ileum, but red tag cell study reports bleeding at pelvic brim/rectum. Angiogram was negative for active bleeding. Recommend consulting GI here at [facility] for repeat colonoscopy. Since he is not actively bleeding at this time they may be able to better visualize his anatomy and source of bleeding. Continue transfusions PRN per primary team.  Jerrye Beavers, [facility] Surgery 06/26/2016, 2:11 PM Pager: [phone] Consults: [phone] Mon-Fri 7:00 am-4:30 pm Sat-Sun 7:00 am-11:30 am

## 2016-06-26 NOTE — Progress Notes (Signed)
PT Cancellation Note  Patient Details Name: Sean Leonard MRN: [id number] DOB: October 29, 1945   Cancelled Treatment:    Reason Eval/Treat Not Completed: Patient not medically ready (hgb 5.3), no appropriate for therapies at this time   Duncan Dull 06/26/2016, 7:41 AM

## 2016-06-27 ENCOUNTER — Inpatient Hospital Stay (HOSPITAL_COMMUNITY): Payer: Medicare HMO

## 2016-06-27 LAB — CBC
HEMATOCRIT: 19.6 % — AB (ref 39.0–52.0)
HEMATOCRIT: 20.9 % — AB (ref 39.0–52.0)
HEMATOCRIT: 22.1 % — AB (ref 39.0–52.0)
HEMATOCRIT: 20.4 % — AB (ref 39.0–52.0)
HEMOGLOBIN: 6.9 g/dL — AB (ref 13.0–17.0)
HEMOGLOBIN: 7.6 g/dL — AB (ref 13.0–17.0)
Hemoglobin: 7.3 g/dL — ABNORMAL LOW (ref 13.0–17.0)
Hemoglobin: 7.1 g/dL — ABNORMAL LOW (ref 13.0–17.0)
MCH: 30.7 pg (ref 26.0–34.0)
MCH: 30.3 pg (ref 26.0–34.0)
MCH: 29.7 pg (ref 26.0–34.0)
MCH: 30.1 pg (ref 26.0–34.0)
MCHC: 35.2 g/dL (ref 30.0–36.0)
MCHC: 34.9 g/dL (ref 30.0–36.0)
MCHC: 34.4 g/dL (ref 30.0–36.0)
MCHC: 34.8 g/dL (ref 30.0–36.0)
MCV: 87.1 fL (ref 78.0–100.0)
MCV: 86.7 fL (ref 78.0–100.0)
MCV: 86.3 fL (ref 78.0–100.0)
MCV: 86.4 fL (ref 78.0–100.0)
PLATELETS: 95 10*3/uL — AB (ref 150–400)
PLATELETS: 119 10*3/uL — AB (ref 150–400)
Platelets: 107 10*3/uL — ABNORMAL LOW (ref 150–400)
Platelets: 109 10*3/uL — ABNORMAL LOW (ref 150–400)
RBC: 2.25 MIL/uL — ABNORMAL LOW (ref 4.22–5.81)
RBC: 2.41 MIL/uL — ABNORMAL LOW (ref 4.22–5.81)
RBC: 2.56 MIL/uL — ABNORMAL LOW (ref 4.22–5.81)
RBC: 2.36 MIL/uL — AB (ref 4.22–5.81)
RDW: 14.7 % (ref 11.5–15.5)
RDW: 14.3 % (ref 11.5–15.5)
RDW: 14.5 % (ref 11.5–15.5)
RDW: 14.6 % (ref 11.5–15.5)
WBC: 12 10*3/uL — ABNORMAL HIGH (ref 4.0–10.5)
WBC: 12.8 10*3/uL — AB (ref 4.0–10.5)
WBC: 14.2 10*3/uL — ABNORMAL HIGH (ref 4.0–10.5)
WBC: 14.1 10*3/uL — ABNORMAL HIGH (ref 4.0–10.5)

## 2016-06-27 LAB — BASIC METABOLIC PANEL
ANION GAP: 4 — AB (ref 5–15)
BUN: 9 mg/dL (ref 6–20)
CHLORIDE: 111 mmol/L (ref 101–111)
CO2: 21 mmol/L — ABNORMAL LOW (ref 22–32)
Calcium: 6.9 mg/dL — ABNORMAL LOW (ref 8.9–10.3)
Creatinine, Ser: 0.69 mg/dL (ref 0.61–1.24)
GFR calc Af Amer: >60 mL/min (ref >60)
GLUCOSE: 87 mg/dL (ref 65–99)
POTASSIUM: 3.5 mmol/L (ref 3.5–5.1)
Sodium: 136 mmol/L (ref 135–145)

## 2016-06-27 LAB — PREPARE FRESH FROZEN PLASMA
UNIT DIVISION: 0
UNIT DIVISION: 0
Unit division: 0
Unit division: 0

## 2016-06-27 LAB — BPAM FFP
BLOOD PRODUCT EXPIRATION DATE: 201804072359
BLOOD PRODUCT EXPIRATION DATE: 201804082359
BLOOD PRODUCT EXPIRATION DATE: 201804082359
Blood Product Expiration Date: 201804072359
ISSUE DATE / TIME: 201804040004
ISSUE DATE / TIME: 201804040136
ISSUE DATE / TIME: 201804040848
ISSUE DATE / TIME: 201804041450
UNIT TYPE AND RH: 6200
Unit Type and Rh: 6200
Unit Type and Rh: 8400
Unit Type and Rh: 8400

## 2016-06-27 LAB — CULTURE, BLOOD (ROUTINE X 2)
Culture: NO GROWTH
Culture: NO GROWTH

## 2016-06-27 LAB — PREPARE RBC (CROSSMATCH)

## 2016-06-27 LAB — COMPREHENSIVE METABOLIC PANEL
ALK PHOS: 27 U/L — AB (ref 38–126)
ALT: 10 U/L — ABNORMAL LOW (ref 17–63)
AST: 16 U/L (ref 15–41)
Albumin: 1.7 g/dL — ABNORMAL LOW (ref 3.5–5.0)
Anion gap: 4 — ABNORMAL LOW (ref 5–15)
BILIRUBIN TOTAL: 0.6 mg/dL (ref 0.3–1.2)
BUN: 10 mg/dL (ref 6–20)
CALCIUM: 7 mg/dL — AB (ref 8.9–10.3)
CO2: 21 mmol/L — ABNORMAL LOW (ref 22–32)
Chloride: 112 mmol/L — ABNORMAL HIGH (ref 101–111)
Creatinine, Ser: 0.71 mg/dL (ref 0.61–1.24)
GFR calc non Af Amer: >60 mL/min (ref >60)
Glucose, Bld: 86 mg/dL (ref 65–99)
POTASSIUM: 3.6 mmol/L (ref 3.5–5.1)
Sodium: 137 mmol/L (ref 135–145)
Total Protein: 3.4 g/dL — ABNORMAL LOW (ref 6.5–8.1)

## 2016-06-27 MED ORDER — SODIUM CHLORIDE 0.9 % IV SOLN: Freq: Once | INTRAVENOUS | Status: DC

## 2016-06-27 MED ORDER — IOPAMIDOL (ISOVUE-370) INJECTION 76%
INTRAVENOUS | Status: AC
  Administered 2016-06-27: 100 mL
  Filled 2016-06-27: qty 100

## 2016-06-27 MED ORDER — PEG-KCL-NACL-NASULF-NA ASC-C 100 G PO SOLR
0.5000 | Freq: Once | ORAL | Status: AC
  Administered 2016-06-28: 100 g via ORAL
  Filled 2016-06-27 (×2): qty 1

## 2016-06-27 MED ORDER — SODIUM CHLORIDE 0.9 % IV SOLN
Freq: Once | INTRAVENOUS | Status: AC
  Administered 2016-06-27: 03:00:00 via INTRAVENOUS

## 2016-06-27 MED ORDER — PEG-KCL-NACL-NASULF-NA ASC-C 100 G PO SOLR: 1.0000 | Freq: Once | ORAL | Status: DC

## 2016-06-27 MED ORDER — SODIUM CHLORIDE 0.9 % IV BOLUS (SEPSIS)
1000.0000 mL | Freq: Once | INTRAVENOUS | Status: AC
  Administered 2016-06-27: 1000 mL via INTRAVENOUS

## 2016-06-27 NOTE — Progress Notes (Signed)
Patient ID: Sean Leonard, male   DOB: 19-May-1945, 71 y.o.   MRN: [id number]  [facility] Surgery Progress Note  3 Days Post-Op  Subjective: CC- lower GI bleed Resting comfortably in bed. States that he had another bloody BM last night. No BM this morning. Hg 6.9 this AM therefore he received 2 more uPRBC. Repeat CBC pending. States that he is tired this morning. Denies any abdominal pain, nausea, or vomiting. Tolerating clears well yesterday until bloody BM.  He went for CTA this morning. GI tentatively planning for colonoscopy in AM.  Objective: Vital signs in last 24 hours: Temp:  [98.2 F (36.8 C)-98.8 F (37.1 C)] 98.8 F (37.1 C) (04/05 1022) Pulse Rate:  [84-109] 91 (04/05 1200) Resp:  [15-23] 16 (04/05 1200) BP: (86-122)/(61-79) 107/73 (04/05 1200) SpO2:  [97 %-100 %] 99 % (04/05 1200) Last BM Date: 06/27/16  Intake/Output from previous day: 04/04 0701 - 04/05 0700 In: 2095 [P.O.:360; I.V.:1400; Blood:335] Out: 352 [Urine:350; Stool:2] Intake/Output this shift: Total I/O In: 230 [I.V.:200; Blood:30] Out: 500 [Urine:500]  PE: Gen:  Alert, NAD, pleasant Card:  RRR, no M/G/R heard Pulm:  CTAB, no W/R/R, effort normal Abd: no previous incisions seen, soft, NT/ND, +BS, no masses, hernias, or organomegaly Ext:  No erythema, edema, or tenderness BUE/BLE  Lab Results:   Recent Labs  06/26/16 1941 06/27/16 0101  WBC 12.9* 12.0*  HGB 7.4* 6.9*  HCT 21.2* 19.6*  PLT 104* 107*   BMET No results for input(s): NA, K, CL, CO2, GLUCOSE, BUN, CREATININE, CALCIUM in the last 72 hours. PT/INR No results for input(s): LABPROT, INR in the last 72 hours. CMP     Component Value Date/Time   NA 137 06/24/2016 0423   K 3.7 06/24/2016 0423   CL 111 06/24/2016 0423   CO2 23 06/24/2016 0423   GLUCOSE 128 (H) 06/24/2016 0423   BUN 10 06/24/2016 0423   CREATININE 0.71 06/24/2016 0423   CALCIUM 7.8 (L) 06/24/2016 0423   PROT 5.2 (L) 06/23/2016 1307   ALBUMIN 2.9 (L)  06/23/2016 1307   ALBUMIN 4.2 05/15/2012   AST 20 06/23/2016 1307   AST 15 05/15/2012   ALT 13 (L) 06/23/2016 1307   ALKPHOS 38 06/23/2016 1307   BILITOT 0.7 06/23/2016 1307   BILITOT 0.4 05/15/2012   GFRNONAA >60 06/24/2016 0423   GFRAA >60 06/24/2016 0423   Lipase     Component Value Date/Time   LIPASE 33 06/22/2016 2050       Studies/Results: Nm Gi Blood Loss  Result Date: 06/26/2016 CLINICAL DATA:  71 y/o M; bright red blood per rectum and lower abdominal pain for several days. Bloody stools 1.5 hours prior to imaging. EXAM: NUCLEAR MEDICINE GASTROINTESTINAL BLEEDING SCAN TECHNIQUE: Sequential abdominal images were obtained following intravenous administration of Tc-62m labeled red blood cells. RADIOPHARMACEUTICALS:  20.7 mCi Tc-29m in-vitro labeled red cells. COMPARISON:  06/22/2016 CT of the abdomen and pelvis. FINDINGS: On dynamic imaging there is progressive radiotracer accumulation within the bladder. Inferiorly at the level of the pelvic brim there is a second focus of radiotracer uptake with variable activity. No abnormal radiotracer uptake is seen passing through small or large bowel within the abdomen. IMPRESSION: Inferiorly at level of pelvic brim there is a focus of radiotracer uptake with variable activity likely representing active bleeding within the rectum. No abnormal radiotracer uptake is seen passing through small or large bowel to suggest a more proximal bleed. Electronically Signed   By: Edgardo Roys.D.  On: 06/26/2016 04:42   Ir Angiogram Visceral Selective  Result Date: 06/26/2016 INDICATION: Acute lower GI bleeding requiring transfusions EXAM: ULTRASOUND GUIDANCE FOR VASCULAR ACCESS CELIAC, SMA, AND IMA CATHETERIZATIONS AND ANGIOGRAMS MEDICATIONS: 1% lidocaine locally. ANESTHESIA/SEDATION: Moderate (conscious) sedation was employed during this procedure. A total of Versed 1.0 mg and Fentanyl 25 mcg was administered intravenously. Moderate Sedation  Time: 35 minutes. The patient's level of consciousness and vital signs were monitored continuously by radiology nursing throughout the procedure under my direct supervision. CONTRAST:  100 cc Isovue FLUOROSCOPY TIME:  Fluoroscopy Time: 6 minutes 24 seconds (460 mGy). COMPLICATIONS: None immediate. PROCEDURE: Informed consent was obtained from the patient following explanation of the procedure, risks, benefits and alternatives. The patient understands, agrees and consents for the procedure. All questions were addressed. A time out was performed prior to the initiation of the procedure. Maximal barrier sterile technique utilized including caps, mask, sterile gowns, sterile gloves, large sterile drape, hand hygiene, and Betadine prep. Under sterile conditions and local anesthesia, ultrasound micropuncture access performed of the right common femoral artery. 5 French sheath inserted over a Bentson guidewire. Initially, a C2 catheter was utilized to select the SMA. SMA angiogram performed. SMA is widely patent. Jejunal branches throughout the mesentery are patent. No evidence of active bleeding. Replaced right hepatic artery noted off of the proximal SMA, a normal variant. On the venous phase, the superior mesenteric vein and portal vein are patent. Catheter was exchanged for a Mickelson catheter. This was reformed in the aorta and utilized to select the celiac origin. Celiac angiogram performed. Celiac origin is widely patent. Mild atherosclerotic change. Splenic, left gastric, and left hepatic vasculature are all patent. The gastroduodenal artery and gastroepiploic artery are visualized and patent. The catheter was retracted and utilized to select the IMA. Selective IMA angiogram performed. IMA is mildly atherosclerotic but patent. The superior hemorrhoidal and left colic branches are all patent. No evidence of active bleeding. On the venous phase, the IMV is patent. Access removed. Hemostasis obtained with the Exoseal  device. No immediate complication. Patient tolerated the procedure well. IMPRESSION: Successful mesenteric angiograms of the celiac, SMA and IMA without evidence of active GI bleeding. Electronically Signed   By: Jerilynn Mages.  Shick M.D.   On: 06/26/2016 09:54   Ir Angiogram Visceral Selective  Result Date: 06/26/2016 INDICATION: Acute lower GI bleeding requiring transfusions EXAM: ULTRASOUND GUIDANCE FOR VASCULAR ACCESS CELIAC, SMA, AND IMA CATHETERIZATIONS AND ANGIOGRAMS MEDICATIONS: 1% lidocaine locally. ANESTHESIA/SEDATION: Moderate (conscious) sedation was employed during this procedure. A total of Versed 1.0 mg and Fentanyl 25 mcg was administered intravenously. Moderate Sedation Time: 35 minutes. The patient's level of consciousness and vital signs were monitored continuously by radiology nursing throughout the procedure under my direct supervision. CONTRAST:  100 cc Isovue FLUOROSCOPY TIME:  Fluoroscopy Time: 6 minutes 24 seconds (460 mGy). COMPLICATIONS: None immediate. PROCEDURE: Informed consent was obtained from the patient following explanation of the procedure, risks, benefits and alternatives. The patient understands, agrees and consents for the procedure. All questions were addressed. A time out was performed prior to the initiation of the procedure. Maximal barrier sterile technique utilized including caps, mask, sterile gowns, sterile gloves, large sterile drape, hand hygiene, and Betadine prep. Under sterile conditions and local anesthesia, ultrasound micropuncture access performed of the right common femoral artery. 5 French sheath inserted over a Bentson guidewire. Initially, a C2 catheter was utilized to select the SMA. SMA angiogram performed. SMA is widely patent. Jejunal branches throughout the mesentery are patent. No evidence  of active bleeding. Replaced right hepatic artery noted off of the proximal SMA, a normal variant. On the venous phase, the superior mesenteric vein and portal vein are  patent. Catheter was exchanged for a Mickelson catheter. This was reformed in the aorta and utilized to select the celiac origin. Celiac angiogram performed. Celiac origin is widely patent. Mild atherosclerotic change. Splenic, left gastric, and left hepatic vasculature are all patent. The gastroduodenal artery and gastroepiploic artery are visualized and patent. The catheter was retracted and utilized to select the IMA. Selective IMA angiogram performed. IMA is mildly atherosclerotic but patent. The superior hemorrhoidal and left colic branches are all patent. No evidence of active bleeding. On the venous phase, the IMV is patent. Access removed. Hemostasis obtained with the Exoseal device. No immediate complication. Patient tolerated the procedure well. IMPRESSION: Successful mesenteric angiograms of the celiac, SMA and IMA without evidence of active GI bleeding. Electronically Signed   By: Jerilynn Mages.  Shick M.D.   On: 06/26/2016 09:54   Ir Angiogram Visceral Selective  Result Date: 06/26/2016 INDICATION: Acute lower GI bleeding requiring transfusions EXAM: ULTRASOUND GUIDANCE FOR VASCULAR ACCESS CELIAC, SMA, AND IMA CATHETERIZATIONS AND ANGIOGRAMS MEDICATIONS: 1% lidocaine locally. ANESTHESIA/SEDATION: Moderate (conscious) sedation was employed during this procedure. A total of Versed 1.0 mg and Fentanyl 25 mcg was administered intravenously. Moderate Sedation Time: 35 minutes. The patient's level of consciousness and vital signs were monitored continuously by radiology nursing throughout the procedure under my direct supervision. CONTRAST:  100 cc Isovue FLUOROSCOPY TIME:  Fluoroscopy Time: 6 minutes 24 seconds (460 mGy). COMPLICATIONS: None immediate. PROCEDURE: Informed consent was obtained from the patient following explanation of the procedure, risks, benefits and alternatives. The patient understands, agrees and consents for the procedure. All questions were addressed. A time out was performed prior to the  initiation of the procedure. Maximal barrier sterile technique utilized including caps, mask, sterile gowns, sterile gloves, large sterile drape, hand hygiene, and Betadine prep. Under sterile conditions and local anesthesia, ultrasound micropuncture access performed of the right common femoral artery. 5 French sheath inserted over a Bentson guidewire. Initially, a C2 catheter was utilized to select the SMA. SMA angiogram performed. SMA is widely patent. Jejunal branches throughout the mesentery are patent. No evidence of active bleeding. Replaced right hepatic artery noted off of the proximal SMA, a normal variant. On the venous phase, the superior mesenteric vein and portal vein are patent. Catheter was exchanged for a Mickelson catheter. This was reformed in the aorta and utilized to select the celiac origin. Celiac angiogram performed. Celiac origin is widely patent. Mild atherosclerotic change. Splenic, left gastric, and left hepatic vasculature are all patent. The gastroduodenal artery and gastroepiploic artery are visualized and patent. The catheter was retracted and utilized to select the IMA. Selective IMA angiogram performed. IMA is mildly atherosclerotic but patent. The superior hemorrhoidal and left colic branches are all patent. No evidence of active bleeding. On the venous phase, the IMV is patent. Access removed. Hemostasis obtained with the Exoseal device. No immediate complication. Patient tolerated the procedure well. IMPRESSION: Successful mesenteric angiograms of the celiac, SMA and IMA without evidence of active GI bleeding. Electronically Signed   By: Jerilynn Mages.  Shick M.D.   On: 06/26/2016 09:54   Ir US Guide Vasc Access Right  Result Date: 06/26/2016 INDICATION: Acute lower GI bleeding requiring transfusions EXAM: ULTRASOUND GUIDANCE FOR VASCULAR ACCESS CELIAC, SMA, AND IMA CATHETERIZATIONS AND ANGIOGRAMS MEDICATIONS: 1% lidocaine locally. ANESTHESIA/SEDATION: Moderate (conscious) sedation was  employed during this procedure. A  total of Versed 1.0 mg and Fentanyl 25 mcg was administered intravenously. Moderate Sedation Time: 35 minutes. The patient's level of consciousness and vital signs were monitored continuously by radiology nursing throughout the procedure under my direct supervision. CONTRAST:  100 cc Isovue FLUOROSCOPY TIME:  Fluoroscopy Time: 6 minutes 24 seconds (460 mGy). COMPLICATIONS: None immediate. PROCEDURE: Informed consent was obtained from the patient following explanation of the procedure, risks, benefits and alternatives. The patient understands, agrees and consents for the procedure. All questions were addressed. A time out was performed prior to the initiation of the procedure. Maximal barrier sterile technique utilized including caps, mask, sterile gowns, sterile gloves, large sterile drape, hand hygiene, and Betadine prep. Under sterile conditions and local anesthesia, ultrasound micropuncture access performed of the right common femoral artery. 5 French sheath inserted over a Bentson guidewire. Initially, a C2 catheter was utilized to select the SMA. SMA angiogram performed. SMA is widely patent. Jejunal branches throughout the mesentery are patent. No evidence of active bleeding. Replaced right hepatic artery noted off of the proximal SMA, a normal variant. On the venous phase, the superior mesenteric vein and portal vein are patent. Catheter was exchanged for a Mickelson catheter. This was reformed in the aorta and utilized to select the celiac origin. Celiac angiogram performed. Celiac origin is widely patent. Mild atherosclerotic change. Splenic, left gastric, and left hepatic vasculature are all patent. The gastroduodenal artery and gastroepiploic artery are visualized and patent. The catheter was retracted and utilized to select the IMA. Selective IMA angiogram performed. IMA is mildly atherosclerotic but patent. The superior hemorrhoidal and left colic branches are all  patent. No evidence of active bleeding. On the venous phase, the IMV is patent. Access removed. Hemostasis obtained with the Exoseal device. No immediate complication. Patient tolerated the procedure well. IMPRESSION: Successful mesenteric angiograms of the celiac, SMA and IMA without evidence of active GI bleeding. Electronically Signed   By: Jerilynn Mages.  Shick M.D.   On: 06/26/2016 09:54    Anti-infectives: Anti-infectives    None       Assessment/Plan Lower GI bleed - known h/o colonic diverticulosis per colonoscopy 12/2013, but no prior h/o GI bleed or diverticulitis - no previous abdominal surgical history - BRBPR began 3/31 - CT scan 3/31 showed mildly prominent vasculature extending along the rectal wall, raising concern for internal hemorrhoids; mild diverticulosis along the ascending colon, without diverticulitis - colonoscopy [facility] 4/2: report says bleeding noted in terminal ileum, rectal bleeding most likely due to ascending colon diverticulosis -  Red tag cell study 4/4 showed possible bleeding at the pelvic brim/rectum - 4/4 mesenteric angiograms of the celiac, SMA and IMA without evidence of active GI bleeding - +bloody BM last night  Diverticulosis Barrett's esophagus DDD Hypothyroidism Bilateral iliac artery aneurysm   ID - none VTE - SCDs FEN - IVF, clear liquids today, NPO after MN for procedure  Plan - CTA results pending. GI tentatively planning for repeat colonoscopy in AM to try to localize site of bleeding. Will continue to follow. Continue transfusions PRN per primary team.   LOS: 4 days    Jerrye Beavers , [facility] Surgery 06/27/2016, 12:31 PM Pager: [phone] Consults: [phone] Mon-Fri 7:00 am-4:30 pm Sat-Sun 7:00 am-11:30 am

## 2016-06-27 NOTE — Consult Note (Signed)
Referring Provider: No ref. provider found Primary Care Physician:  Pcp Not In System Primary Gastroenterologist:  Dr. Sydell Axon  Reason for Consultation:  LGIB  HPI: DEMONTEZ NOVACK is a 71 y.o. male with hx of known diverticulosis with colonoscopy by Dr Felipe Drone 3 years ago, hx of Barretts' s/p EGD same time, DDD, hypothyrodism, bilateral iliac artery aneurysm.  He presented to the ER at [facility] with BRBPR on 3/31.  Had colonoscopy 4/2 at [facility] with blood in right colon (results below).  Patient transferred here for IR for ongoing bleeding.  Bleeding scan positive for pelvic brim/rectum.  IR angio unable to identify source.  Patient re-bled early this AM.  CT angio results pending from this AM.  I believe that he has been given 11 units PRBC's and some FFP as well.  Most recent Hgb 7.3 grams.  Colonoscopy by Dr. Oneida Alar on 4/2:  The terminal ileum contained red blood. A 6 mm polyp was found in the cecum. The polyp was sessile. The polyp was removed with a hot snare. Resection and retrieval were complete. The recto-sigmoid colon, sigmoid colon and descending colon were moderately redundant. Multiple small and large-mouthed diverticula were found in the ascending colon. External and internal hemorrhoids were found during retroflexion.  Surgery has been consulted as well and is following the patient.  IR, surgery, and hospitalists requesting repeat colonoscopy prior to considering other intervention.  Past Medical History:  Diagnosis Date  . DDD (degenerative disc disease), lumbar   . Diverticula of colon   . Iliac artery aneurysm, bilateral (HCC)    with significant stenosis of right internal iliac artery  . Peptic ulcer   . Prostatitis   . Thyroid disease     Past Surgical History:  Procedure Laterality Date  . COLONOSCOPY  April 2005   Dr. Irving Shows. Small internal hemorrhoids. Multiple small scattered diverticula in the cecum and descending colon.  . COLONOSCOPY N/A 01/18/2014   Procedure: COLONOSCOPY;  Surgeon: Daneil Dolin, MD;  Location: [facility];  Service: Endoscopy;  Laterality: N/A;  1100  . COLONOSCOPY N/A 06/23/2016   Procedure: COLONOSCOPY;  Surgeon: Rogene Houston, MD;  Location: [facility];  Service: Endoscopy;  Laterality: N/A;  . COLONOSCOPY WITH ESOPHAGOGASTRODUODENOSCOPY (EGD) N/A 12/16/2012   Dr. Gala Romney: rectal and colonic polyps with inadequate preparation, tubular adenoma. EGD with biopsy proven short segment Barrett's esophagus, hiatal hernia, mild chronic inactive gastritis   . COLONOSCOPY WITH PROPOFOL N/A 06/24/2016   Procedure: COLONOSCOPY WITH PROPOFOL;  Surgeon: Danie Binder, MD;  Location: [facility];  Service: Endoscopy;  Laterality: N/A;  . ESOPHAGOGASTRODUODENOSCOPY  July 2005   Dr. Irving Shows grade 2 esophagitis at the GE junction. Acute gastritis. Multiple acute active, deep, irregular, friable ulcers in the antrum and body of the stomach. 2 active, deep, friable, irregular ulcers in the duodenum. Pathology not available.  . ESOPHAGOGASTRODUODENOSCOPY N/A 01/18/2014   Procedure: ESOPHAGOGASTRODUODENOSCOPY (EGD);  Surgeon: Daneil Dolin, MD;  Location: [facility];  Service: Endoscopy;  Laterality: N/A;  . IR ANGIOGRAM VISCERAL SELECTIVE  06/26/2016  . IR ANGIOGRAM VISCERAL SELECTIVE  06/26/2016  . IR ANGIOGRAM VISCERAL SELECTIVE  06/26/2016  . IR US GUIDE VASC ACCESS RIGHT  06/26/2016  . POLYPECTOMY  06/24/2016   Procedure: POLYPECTOMY;  Surgeon: Danie Binder, MD;  Location: [facility];  Service: Endoscopy;;  cecal  . THYROIDECTOMY    . TONSILLECTOMY      Prior to Admission medications  Medication Sig Start Date End Date Taking? Authorizing Provider  fish oil-omega-3 fatty acids 1000 MG capsule Take 2 g by mouth daily.   Yes Historical Provider, MD  levothyroxine (SYNTHROID, LEVOTHROID) 112 MCG tablet Take 112 mcg by mouth every morning.   Yes Historical Provider, MD  Multiple Vitamin (MULTIVITAMIN WITH MINERALS) TABS tablet  Take 1 tablet by mouth daily.   Yes Historical Provider, MD  omeprazole (PRILOSEC) 20 MG capsule TAKE 1 CAPSULE EVERY DAY 12/27/15  Yes Tiffany Kocher, PA-C  pravastatin (PRAVACHOL) 20 MG tablet Take 20 mg by mouth at bedtime.   Yes Historical Provider, MD    Current Facility-Administered Medications  Medication Dose Route Frequency Provider Last Rate Last Dose  . 0.9 %  sodium chloride infusion   Intravenous Continuous Ozella Rocks, MD 50 mL/hr at 06/27/16 1210    . 0.9 %  sodium chloride infusion   Intravenous Once Jessica U Vann, DO      . 0.9 %  sodium chloride infusion   Intravenous Once Leda Gauze, NP      . chlorhexidine (PERIDEX) 0.12 % solution 15 mL  15 mL Mouth Rinse BID Ozella Rocks, MD   15 mL at 06/27/16 0827  . feeding supplement (BOOST / RESOURCE BREEZE) liquid 1 Container  1 Container Oral TID BM Houston Siren, MD   1 Container at 06/25/16 2000  . levothyroxine (SYNTHROID, LEVOTHROID) tablet 112 mcg  112 mcg Oral QAC breakfast Houston Siren, MD   112 mcg at 06/27/16 [phone]  . MEDLINE mouth rinse  15 mL Mouth Rinse q12n4p Ozella Rocks, MD      . morphine 2 MG/ML injection 2 mg  2 mg Intravenous Q2H PRN Leda Gauze, NP   2 mg at 06/27/16 0110  . omega-3 acid ethyl esters (LOVAZA) capsule 2 g  2 g Oral Daily Houston Siren, MD   2 g at 06/25/16 1053  . pantoprazole (PROTONIX) EC tablet 40 mg  40 mg Oral QAC breakfast West Bali, MD   40 mg at 06/27/16 0826  . peg 3350 powder (MOVIPREP) kit 100 g  0.5 kit Oral Once Dorothea Ogle, MD       And  . Melene Muller ON 06/28/2016] peg 3350 powder (MOVIPREP) kit 100 g  0.5 kit Oral Once Dorothea Ogle, MD      . pravastatin (PRAVACHOL) tablet 20 mg  20 mg Oral QHS Houston Siren, MD   20 mg at 06/26/16 2200    Allergies as of 06/22/2016  . (No Known Allergies)    Family History  Problem Relation Age of Onset  . Hypertension Mother   . Heart disease Mother     before age 69  . Heart disease Father     before age 74  .  Hyperlipidemia Sister   . Cancer Sister   . Colon cancer Neg Hx   . Liver disease Neg Hx     Social History   Social History  . Marital status: Married    Spouse name: N/A  . Number of children: N/A  . Years of education: N/A   Occupational History  . Not on file.   Social History Main Topics  . Smoking status: Former Smoker    Years: 25.00    Types: Cigars    Quit date: 11/27/2006  . Smokeless tobacco: Never Used     Comment: Quit in 2009  . Alcohol use No     Comment: former.  weekend drinker (liqour) before, May 2015 last time he drank during a Therapist, nutritional.   . Drug use: No  . Sexual activity: Not on file   Other Topics Concern  . Not on file   Social History Narrative   1 deceased child. 1 living    Review of Systems:   Physical Exam: Vital signs in last 24 hours: Temp:  [98.2 F (36.8 C)-98.8 F (37.1 C)] 98.8 F (37.1 C) (04/05 1022) Pulse Rate:  [84-109] 91 (04/05 1200) Resp:  [15-23] 16 (04/05 1200) BP: (86-115)/(61-74) 107/73 (04/05 1200) SpO2:  [97 %-100 %] 99 % (04/05 1200) Last BM Date: 06/27/16 General:  Alert, Well-developed, well-nourished, pleasant and cooperative in NAD Head:  Normocephalic and atraumatic. Eyes:  Sclera clear, no icterus.  Conjunctiva pink. Ears:  Normal auditory acuity. Mouth:  No deformity or lesions.   Lungs:  Clear throughout to auscultation.  No wheezes, crackles, or rhonchi.  No increased WOB. Heart:  Regular rate and rhythm; no murmurs, clicks, rubs, or gallops. Abdomen:  Soft, non-distended.  BS present.  Mild lower abdominal TTP. Rectal:  Deferred  Msk:  Symmetrical without gross deformities. Pulses:  Normal pulses noted. Extremities:  Without clubbing or edema. Neurologic:  Alert and oriented x 4;  grossly normal neurologically. Skin:  Intact without significant lesions or rashes. Psych:  Alert and cooperative. Normal mood and affect.  Intake/Output from previous day: 04/04 0701 - 04/05 0700 In: 2095  [P.O.:360; I.V.:1400; Blood:335] Out: 352 [Urine:350; Stool:2] Intake/Output this shift: Total I/O In: 230 [I.V.:200; Blood:30] Out: 500 [Urine:500]  Lab Results:  Recent Labs  06/26/16 1941 06/27/16 0101 06/27/16 1153  WBC 12.9* 12.0* 12.8*  HGB 7.4* 6.9* 7.3*  HCT 21.2* 19.6* 20.9*  PLT 104* 107* 95*   Studies/Results: Nm Gi Blood Loss  Result Date: 06/26/2016 CLINICAL DATA:  71 y/o M; bright red blood per rectum and lower abdominal pain for several days. Bloody stools 1.5 hours prior to imaging. EXAM: NUCLEAR MEDICINE GASTROINTESTINAL BLEEDING SCAN TECHNIQUE: Sequential abdominal images were obtained following intravenous administration of Tc-51mlabeled red blood cells. RADIOPHARMACEUTICALS:  20.7 mCi Tc-937mn-vitro labeled red cells. COMPARISON:  06/22/2016 CT of the abdomen and pelvis. FINDINGS: On dynamic imaging there is progressive radiotracer accumulation within the bladder. Inferiorly at the level of the pelvic brim there is a second focus of radiotracer uptake with variable activity. No abnormal radiotracer uptake is seen passing through small or large bowel within the abdomen. IMPRESSION: Inferiorly at level of pelvic brim there is a focus of radiotracer uptake with variable activity likely representing active bleeding within the rectum. No abnormal radiotracer uptake is seen passing through small or large bowel to suggest a more proximal bleed. Electronically Signed   By: LaKristine Garbe.D.   On: 06/26/2016 04:42   Ir Angiogram Visceral Selective  Result Date: 06/26/2016 INDICATION: Acute lower GI bleeding requiring transfusions EXAM: ULTRASOUND GUIDANCE FOR VASCULAR ACCESS CELIAC, SMA, AND IMA CATHETERIZATIONS AND ANGIOGRAMS MEDICATIONS: 1% lidocaine locally. ANESTHESIA/SEDATION: Moderate (conscious) sedation was employed during this procedure. A total of Versed 1.0 mg and Fentanyl 25 mcg was administered intravenously. Moderate Sedation Time: 35 minutes. The  patient's level of consciousness and vital signs were monitored continuously by radiology nursing throughout the procedure under my direct supervision. CONTRAST:  100 cc Isovue FLUOROSCOPY TIME:  Fluoroscopy Time: 6 minutes 24 seconds (460 mGy). COMPLICATIONS: None immediate. PROCEDURE: Informed consent was obtained from the patient following explanation of the procedure, risks, benefits and alternatives. The patient  understands, agrees and consents for the procedure. All questions were addressed. A time out was performed prior to the initiation of the procedure. Maximal barrier sterile technique utilized including caps, mask, sterile gowns, sterile gloves, large sterile drape, hand hygiene, and Betadine prep. Under sterile conditions and local anesthesia, ultrasound micropuncture access performed of the right common femoral artery. 5 French sheath inserted over a Bentson guidewire. Initially, a C2 catheter was utilized to select the SMA. SMA angiogram performed. SMA is widely patent. Jejunal branches throughout the mesentery are patent. No evidence of active bleeding. Replaced right hepatic artery noted off of the proximal SMA, a normal variant. On the venous phase, the superior mesenteric vein and portal vein are patent. Catheter was exchanged for a Mickelson catheter. This was reformed in the aorta and utilized to select the celiac origin. Celiac angiogram performed. Celiac origin is widely patent. Mild atherosclerotic change. Splenic, left gastric, and left hepatic vasculature are all patent. The gastroduodenal artery and gastroepiploic artery are visualized and patent. The catheter was retracted and utilized to select the IMA. Selective IMA angiogram performed. IMA is mildly atherosclerotic but patent. The superior hemorrhoidal and left colic branches are all patent. No evidence of active bleeding. On the venous phase, the IMV is patent. Access removed. Hemostasis obtained with the Exoseal device. No immediate  complication. Patient tolerated the procedure well. IMPRESSION: Successful mesenteric angiograms of the celiac, SMA and IMA without evidence of active GI bleeding. Electronically Signed   By: Jerilynn Mages.  Shick M.D.   On: 06/26/2016 09:54   Ir Angiogram Visceral Selective  Result Date: 06/26/2016 INDICATION: Acute lower GI bleeding requiring transfusions EXAM: ULTRASOUND GUIDANCE FOR VASCULAR ACCESS CELIAC, SMA, AND IMA CATHETERIZATIONS AND ANGIOGRAMS MEDICATIONS: 1% lidocaine locally. ANESTHESIA/SEDATION: Moderate (conscious) sedation was employed during this procedure. A total of Versed 1.0 mg and Fentanyl 25 mcg was administered intravenously. Moderate Sedation Time: 35 minutes. The patient's level of consciousness and vital signs were monitored continuously by radiology nursing throughout the procedure under my direct supervision. CONTRAST:  100 cc Isovue FLUOROSCOPY TIME:  Fluoroscopy Time: 6 minutes 24 seconds (460 mGy). COMPLICATIONS: None immediate. PROCEDURE: Informed consent was obtained from the patient following explanation of the procedure, risks, benefits and alternatives. The patient understands, agrees and consents for the procedure. All questions were addressed. A time out was performed prior to the initiation of the procedure. Maximal barrier sterile technique utilized including caps, mask, sterile gowns, sterile gloves, large sterile drape, hand hygiene, and Betadine prep. Under sterile conditions and local anesthesia, ultrasound micropuncture access performed of the right common femoral artery. 5 French sheath inserted over a Bentson guidewire. Initially, a C2 catheter was utilized to select the SMA. SMA angiogram performed. SMA is widely patent. Jejunal branches throughout the mesentery are patent. No evidence of active bleeding. Replaced right hepatic artery noted off of the proximal SMA, a normal variant. On the venous phase, the superior mesenteric vein and portal vein are patent. Catheter was  exchanged for a Mickelson catheter. This was reformed in the aorta and utilized to select the celiac origin. Celiac angiogram performed. Celiac origin is widely patent. Mild atherosclerotic change. Splenic, left gastric, and left hepatic vasculature are all patent. The gastroduodenal artery and gastroepiploic artery are visualized and patent. The catheter was retracted and utilized to select the IMA. Selective IMA angiogram performed. IMA is mildly atherosclerotic but patent. The superior hemorrhoidal and left colic branches are all patent. No evidence of active bleeding. On the venous phase, the IMV is patent.  Access removed. Hemostasis obtained with the Exoseal device. No immediate complication. Patient tolerated the procedure well. IMPRESSION: Successful mesenteric angiograms of the celiac, SMA and IMA without evidence of active GI bleeding. Electronically Signed   By: Jerilynn Mages.  Shick M.D.   On: 06/26/2016 09:54   Ir Angiogram Visceral Selective  Result Date: 06/26/2016 INDICATION: Acute lower GI bleeding requiring transfusions EXAM: ULTRASOUND GUIDANCE FOR VASCULAR ACCESS CELIAC, SMA, AND IMA CATHETERIZATIONS AND ANGIOGRAMS MEDICATIONS: 1% lidocaine locally. ANESTHESIA/SEDATION: Moderate (conscious) sedation was employed during this procedure. A total of Versed 1.0 mg and Fentanyl 25 mcg was administered intravenously. Moderate Sedation Time: 35 minutes. The patient's level of consciousness and vital signs were monitored continuously by radiology nursing throughout the procedure under my direct supervision. CONTRAST:  100 cc Isovue FLUOROSCOPY TIME:  Fluoroscopy Time: 6 minutes 24 seconds (460 mGy). COMPLICATIONS: None immediate. PROCEDURE: Informed consent was obtained from the patient following explanation of the procedure, risks, benefits and alternatives. The patient understands, agrees and consents for the procedure. All questions were addressed. A time out was performed prior to the initiation of the  procedure. Maximal barrier sterile technique utilized including caps, mask, sterile gowns, sterile gloves, large sterile drape, hand hygiene, and Betadine prep. Under sterile conditions and local anesthesia, ultrasound micropuncture access performed of the right common femoral artery. 5 French sheath inserted over a Bentson guidewire. Initially, a C2 catheter was utilized to select the SMA. SMA angiogram performed. SMA is widely patent. Jejunal branches throughout the mesentery are patent. No evidence of active bleeding. Replaced right hepatic artery noted off of the proximal SMA, a normal variant. On the venous phase, the superior mesenteric vein and portal vein are patent. Catheter was exchanged for a Mickelson catheter. This was reformed in the aorta and utilized to select the celiac origin. Celiac angiogram performed. Celiac origin is widely patent. Mild atherosclerotic change. Splenic, left gastric, and left hepatic vasculature are all patent. The gastroduodenal artery and gastroepiploic artery are visualized and patent. The catheter was retracted and utilized to select the IMA. Selective IMA angiogram performed. IMA is mildly atherosclerotic but patent. The superior hemorrhoidal and left colic branches are all patent. No evidence of active bleeding. On the venous phase, the IMV is patent. Access removed. Hemostasis obtained with the Exoseal device. No immediate complication. Patient tolerated the procedure well. IMPRESSION: Successful mesenteric angiograms of the celiac, SMA and IMA without evidence of active GI bleeding. Electronically Signed   By: Jerilynn Mages.  Shick M.D.   On: 06/26/2016 09:54   Ir US Guide Vasc Access Right  Result Date: 06/26/2016 INDICATION: Acute lower GI bleeding requiring transfusions EXAM: ULTRASOUND GUIDANCE FOR VASCULAR ACCESS CELIAC, SMA, AND IMA CATHETERIZATIONS AND ANGIOGRAMS MEDICATIONS: 1% lidocaine locally. ANESTHESIA/SEDATION: Moderate (conscious) sedation was employed during this  procedure. A total of Versed 1.0 mg and Fentanyl 25 mcg was administered intravenously. Moderate Sedation Time: 35 minutes. The patient's level of consciousness and vital signs were monitored continuously by radiology nursing throughout the procedure under my direct supervision. CONTRAST:  100 cc Isovue FLUOROSCOPY TIME:  Fluoroscopy Time: 6 minutes 24 seconds (460 mGy). COMPLICATIONS: None immediate. PROCEDURE: Informed consent was obtained from the patient following explanation of the procedure, risks, benefits and alternatives. The patient understands, agrees and consents for the procedure. All questions were addressed. A time out was performed prior to the initiation of the procedure. Maximal barrier sterile technique utilized including caps, mask, sterile gowns, sterile gloves, large sterile drape, hand hygiene, and Betadine prep. Under sterile conditions and local  anesthesia, ultrasound micropuncture access performed of the right common femoral artery. 5 French sheath inserted over a Bentson guidewire. Initially, a C2 catheter was utilized to select the SMA. SMA angiogram performed. SMA is widely patent. Jejunal branches throughout the mesentery are patent. No evidence of active bleeding. Replaced right hepatic artery noted off of the proximal SMA, a normal variant. On the venous phase, the superior mesenteric vein and portal vein are patent. Catheter was exchanged for a Mickelson catheter. This was reformed in the aorta and utilized to select the celiac origin. Celiac angiogram performed. Celiac origin is widely patent. Mild atherosclerotic change. Splenic, left gastric, and left hepatic vasculature are all patent. The gastroduodenal artery and gastroepiploic artery are visualized and patent. The catheter was retracted and utilized to select the IMA. Selective IMA angiogram performed. IMA is mildly atherosclerotic but patent. The superior hemorrhoidal and left colic branches are all patent. No evidence of  active bleeding. On the venous phase, the IMV is patent. Access removed. Hemostasis obtained with the Exoseal device. No immediate complication. Patient tolerated the procedure well. IMPRESSION: Successful mesenteric angiograms of the celiac, SMA and IMA without evidence of active GI bleeding. Electronically Signed   By: Jerilynn Mages.  Shick M.D.   On: 06/26/2016 09:54   IMPRESSION:  *LGIB:  Likely diverticular.  Had colonoscopy 4/2 at [facility] with blood in right colon.  Patient transferred here for IR.  Bleeding scan positive for pelvic brim/rectum.  IR angio unable to identify source.  Patient re-bled early this AM.  CT angio results pending. *ABLA:  I believe that he has been given 11 units PRBC's and some FFP as well.  PLAN: *Plan for colonoscopy on 4/6 with Dr. Silverio Decamp as another attempt to try to identify the bleeding site.  Yield is low but understandable want to exhaust all of our options before surgical intervention. *Monitor Hgb and transfuse further prn.   ZEHR, JESSICA D.  06/27/2016, 1:13 PM  Pager number [phone]   Attending physician's note   I have taken a history, examined the patient and reviewed the chart. I agree with the Advanced Practitioner's note, impression and recommendations. Recurrent hematochezia, colonoscopy 06/24/2016 with findings of blood in the right colon. Unable to localize the site of bleeding on nuclear med RBC scan. CT and CT angio negative as it was done when patient was not actively bleeding. No further episodes since last night. Tentatively plan for repeat colonoscopy tomorrow to evaluate and try to localize the site of bleeding  K Denzil Magnuson, MD [phone] Mon-Fri 8a-5p [phone] after 5p, weekends, holidays

## 2016-06-27 NOTE — Progress Notes (Signed)
PROGRESS NOTE  Sean Leonard  ULA:[id number] DOB: [birth year] DOA: 06/22/2016   PCP: Pcp Not In System   Brief Narrative:  71 y.o. male with hx of known diverticulosis with colonoscopy by Dr Felipe Drone 3 years ago, hx of Barretts' s/p EGD same time, DDD, hypothyrodism, bilateral iliac artery aneurysm, presented to the ER with BRBPR. He denied using NSAIDS, and had not been on ASA due to his prior hx of PUD.  Hb was found to be at 12.2g per dl, normal BUN, and normal INR.  BP on presentation was in the 70's. CT of the abd pelvis was done showing showing enlarged pancreatic duct, suspicious for chronic enlargement, and diverticulosis, with internal hemorrhoid.  Colonoscopy was done at [facility].  Patient re-bled and was sent to [facility] for bleeding scan and then IR for angiogram.  No source of bleeding found to this point.    Assessment & Plan:   LGIB - Endoscopy 4/2 by GI- Dr. Oneida Alar- Polyp, ascending colon diverticulosis had further bleeding got tagged blood cell scan/angiography but no source of bleeding found. (spoke with IR, not sure bleeding on scan is accurate- could be bladder etc) - GI on board, plan for repeat colonoscopy  - transfuse two U PRBC this AM 4/5  Syncopal episode, Hypotension - several vasovagal episodes after bowel movement - BP stable this AM   Left upper extremity numbness- resolved, head CT neg for intracranial bleed, - No further stroke workup  ABLA, thrombocytopenia  - has been given FFP and PRBC (appears to be 9 units of PRBC) - two more units of PRBC will be transfused this AM   Leukocytosis - trending down - repeat CBC in AM  Hx of Barretts and PUD - No NSAIDS use and no ASA.  No evidence of UGIB.  DVT prophylaxis:  SCDs  Code Status: Full Family Communication: daughter over the phone  Disposition Plan: remain in SDU  Consultants:   Gastroenterology  Procedures:   Colonoscopy.  Bleeding scan  Angiogram   Antimicrobials:   None    Subjective: Had episode of rectal bleed overnight.   Objective: Vitals:   06/27/16 0800 06/27/16 0810 06/27/16 1015 06/27/16 1022  BP: 98/69 100/66  104/69  Pulse: 94 96 95 93  Resp: (!) _0 Temp:  98.4 F (36.9 C)  98.8 F (37.1 C)  TempSrc:  Oral  Oral  SpO2: 98% 98% 97% 98%  Weight:      Height:        Intake/Output Summary (Last 24 hours) at 06/27/16 1103 Last data filed at 06/27/16 1015  Gross per 24 hour  Intake             2325 ml  Output              577 ml  Net             1748 ml   Filed Weights   06/24/16 1048 06/25/16 0500 06/25/16 1733  Weight: 78.5 kg (173 lb) 80.9 kg (178 lb 5.6 oz) 80.8 kg (178 lb 2.1 oz)    Examination:  General exam: Awake, NAD Respiratory system: Clear to auscultation. Respiratory effort normal. Cardiovascular system: S1 & S2 heard, RRR. No JVD, murmurs, rubs, gallops or clicks. No pedal edema. Gastrointestinal system: mildly tender, + BS Central nervous system: A+Ox3, NAD Extremities: Symmetric 5 x 5 power. Skin: No rashes, lesions or ulcers  Data Reviewed: I have personally reviewed following labs and imaging  studies  CBC:  Recent Labs Lab 06/22/16 2050  06/25/16 1844 06/25/16 2138 06/26/16 0954 06/26/16 1417 06/26/16 1941 06/27/16 0101  WBC 10.0  < > 15.2*  --  14.1* 12.8* 12.9* 12.0*  NEUTROABS 3.0  --   --   --   --   --   --   --   HGB 11.7*  < > 7.6* 5.3* 7.7* 7.7* 7.4* 6.9*  HCT 35.7*  < > 21.8* 15.1* 21.8* 22.2* 21.2* 19.6*  MCV 90.8  < > 86.5  --  86.9 86.4 86.9 87.1  PLT 243  < > 110*  --  90* 93* 104* 107*  < > = values in this interval not displayed. Basic Metabolic Panel:  Recent Labs Lab 06/22/16 2050 06/22/16 2052 06/23/16 1307 06/24/16 0423  NA 135 137 139 137  K 3.0* 5.6* 3.5 3.7  CL 102 107 111 111  CO2 24  --  23 23  GLUCOSE 167* 146* 148* 128*  BUN 18 24* 13 10  CREATININE 1.27* 1.20 0.83 0.71  CALCIUM 8.7*  --  8.1* 7.8*  MG  --   --  1.8  --    Liver Function  Tests:  Recent Labs Lab 06/22/16 2050 06/23/16 1307  AST 18 20  ALT 16* 13*  ALKPHOS 50 38  BILITOT 0.5 0.7  PROT 6.5 5.2*  ALBUMIN 3.6 2.9*    Recent Labs Lab 06/22/16 2050  LIPASE 33   Coagulation Profile:  Recent Labs Lab 06/22/16 2050  INR 0.97   Cardiac Enzymes:  Recent Labs Lab 06/22/16 2050 06/23/16 1307  TROPONINI <0.03 <0.03   CBG:  Recent Labs Lab 06/23/16 1234 06/24/16 0635 06/25/16 1416  GLUCAP 152* 139* 108*   Sepsis Labs:  Recent Labs Lab 06/22/16 2056 06/23/16 0900 06/23/16 1500  LATICACIDVEN 2.76* 2.5* 1.9    Recent Results (from the past 240 hour(s))  Culture, blood (Routine x 2)     Status: None   Collection Time: 06/22/16  9:08 PM  Result Value Ref Range Status   Specimen Description BLOOD LEFT ARM  Final   Special Requests BOTTLES DRAWN AEROBIC AND ANAEROBIC 6CC EACH  Final   Culture NO GROWTH 5 DAYS  Final   Report Status 06/27/2016 FINAL  Final  Culture, blood (Routine x 2)     Status: None   Collection Time: 06/22/16  9:25 PM  Result Value Ref Range Status   Specimen Description BLOOD RIGHT ARM  Final   Special Requests BOTTLES DRAWN AEROBIC AND ANAEROBIC 4CC EACH  Final   Culture NO GROWTH 5 DAYS  Final   Report Status 06/27/2016 FINAL  Final  Culture, blood (routine x 2)     Status: None (Preliminary result)   Collection Time: 06/23/16  1:08 PM  Result Value Ref Range Status   Specimen Description BLOOD  Final   Special Requests   Final    Blood Culture results may not be optimal due to an inadequate volume of blood received in culture bottles   Culture NO GROWTH 4 DAYS  Final   Report Status PENDING  Incomplete  Culture, blood (routine x 2)     Status: None (Preliminary result)   Collection Time: 06/23/16  1:09 PM  Result Value Ref Range Status   Specimen Description BLOOD  Final   Special Requests Blood Culture adequate volume  Final   Culture NO GROWTH 4 DAYS  Final   Report Status PENDING  Incomplete  MRSA  PCR  Screening     Status: None   Collection Time: 06/23/16  7:47 PM  Result Value Ref Range Status   MRSA by PCR NEGATIVE NEGATIVE Final    Comment:        The GeneXpert MRSA Assay (FDA approved for NASAL specimens only), is one component of a comprehensive MRSA colonization surveillance program. It is not intended to diagnose MRSA infection nor to guide or monitor treatment for MRSA infections.     Radiology Studies: Nm Gi Blood Loss  Result Date: 06/26/2016 CLINICAL DATA:  71 y/o M; bright red blood per rectum and lower abdominal pain for several days. Bloody stools 1.5 hours prior to imaging. EXAM: NUCLEAR MEDICINE GASTROINTESTINAL BLEEDING SCAN TECHNIQUE: Sequential abdominal images were obtained following intravenous administration of Tc-54mlabeled red blood cells. RADIOPHARMACEUTICALS:  20.7 mCi [id number]-vitro labeled red cells. COMPARISON:  06/22/2016 CT of the abdomen and pelvis. FINDINGS: On dynamic imaging there is progressive radiotracer accumulation within the bladder. Inferiorly at the level of the pelvic brim there is a second focus of radiotracer uptake with variable activity. No abnormal radiotracer uptake is seen passing through small or large bowel within the abdomen. IMPRESSION: Inferiorly at level of pelvic brim there is a focus of radiotracer uptake with variable activity likely representing active bleeding within the rectum. No abnormal radiotracer uptake is seen passing through small or large bowel to suggest a more proximal bleed. Electronically Signed   By: LaKristine Garbe.D.   On: 06/26/2016 04:42   Ir Angiogram Visceral Selective  Result Date: 06/26/2016 INDICATION: Acute lower GI bleeding requiring transfusions EXAM: ULTRASOUND GUIDANCE FOR VASCULAR ACCESS CELIAC, SMA, AND IMA CATHETERIZATIONS AND ANGIOGRAMS MEDICATIONS: 1% lidocaine locally. ANESTHESIA/SEDATION: Moderate (conscious) sedation was employed during this procedure. A total of Versed 1.0 mg  and Fentanyl 25 mcg was administered intravenously. Moderate Sedation Time: 35 minutes. The patient's level of consciousness and vital signs were monitored continuously by radiology nursing throughout the procedure under my direct supervision. CONTRAST:  100 cc Isovue FLUOROSCOPY TIME:  Fluoroscopy Time: 6 minutes 24 seconds (460 mGy). COMPLICATIONS: None immediate. PROCEDURE: Informed consent was obtained from the patient following explanation of the procedure, risks, benefits and alternatives. The patient understands, agrees and consents for the procedure. All questions were addressed. A time out was performed prior to the initiation of the procedure. Maximal barrier sterile technique utilized including caps, mask, sterile gowns, sterile gloves, large sterile drape, hand hygiene, and Betadine prep. Under sterile conditions and local anesthesia, ultrasound micropuncture access performed of the right common femoral artery. 5 French sheath inserted over a Bentson guidewire. Initially, a C2 catheter was utilized to select the SMA. SMA angiogram performed. SMA is widely patent. Jejunal branches throughout the mesentery are patent. No evidence of active bleeding. Replaced right hepatic artery noted off of the proximal SMA, a normal variant. On the venous phase, the superior mesenteric vein and portal vein are patent. Catheter was exchanged for a Mickelson catheter. This was reformed in the aorta and utilized to select the celiac origin. Celiac angiogram performed. Celiac origin is widely patent. Mild atherosclerotic change. Splenic, left gastric, and left hepatic vasculature are all patent. The gastroduodenal artery and gastroepiploic artery are visualized and patent. The catheter was retracted and utilized to select the IMA. Selective IMA angiogram performed. IMA is mildly atherosclerotic but patent. The superior hemorrhoidal and left colic branches are all patent. No evidence of active bleeding. On the venous phase,  the IMV is patent. Access removed. Hemostasis obtained with  the Exoseal device. No immediate complication. Patient tolerated the procedure well. IMPRESSION: Successful mesenteric angiograms of the celiac, SMA and IMA without evidence of active GI bleeding. Electronically Signed   By: Jerilynn Mages.  Shick M.D.   On: 06/26/2016 09:54   Ir Angiogram Visceral Selective  Result Date: 06/26/2016 INDICATION: Acute lower GI bleeding requiring transfusions EXAM: ULTRASOUND GUIDANCE FOR VASCULAR ACCESS CELIAC, SMA, AND IMA CATHETERIZATIONS AND ANGIOGRAMS MEDICATIONS: 1% lidocaine locally. ANESTHESIA/SEDATION: Moderate (conscious) sedation was employed during this procedure. A total of Versed 1.0 mg and Fentanyl 25 mcg was administered intravenously. Moderate Sedation Time: 35 minutes. The patient's level of consciousness and vital signs were monitored continuously by radiology nursing throughout the procedure under my direct supervision. CONTRAST:  100 cc Isovue FLUOROSCOPY TIME:  Fluoroscopy Time: 6 minutes 24 seconds (460 mGy). COMPLICATIONS: None immediate. PROCEDURE: Informed consent was obtained from the patient following explanation of the procedure, risks, benefits and alternatives. The patient understands, agrees and consents for the procedure. All questions were addressed. A time out was performed prior to the initiation of the procedure. Maximal barrier sterile technique utilized including caps, mask, sterile gowns, sterile gloves, large sterile drape, hand hygiene, and Betadine prep. Under sterile conditions and local anesthesia, ultrasound micropuncture access performed of the right common femoral artery. 5 French sheath inserted over a Bentson guidewire. Initially, a C2 catheter was utilized to select the SMA. SMA angiogram performed. SMA is widely patent. Jejunal branches throughout the mesentery are patent. No evidence of active bleeding. Replaced right hepatic artery noted off of the proximal SMA, a normal variant. On  the venous phase, the superior mesenteric vein and portal vein are patent. Catheter was exchanged for a Mickelson catheter. This was reformed in the aorta and utilized to select the celiac origin. Celiac angiogram performed. Celiac origin is widely patent. Mild atherosclerotic change. Splenic, left gastric, and left hepatic vasculature are all patent. The gastroduodenal artery and gastroepiploic artery are visualized and patent. The catheter was retracted and utilized to select the IMA. Selective IMA angiogram performed. IMA is mildly atherosclerotic but patent. The superior hemorrhoidal and left colic branches are all patent. No evidence of active bleeding. On the venous phase, the IMV is patent. Access removed. Hemostasis obtained with the Exoseal device. No immediate complication. Patient tolerated the procedure well. IMPRESSION: Successful mesenteric angiograms of the celiac, SMA and IMA without evidence of active GI bleeding. Electronically Signed   By: Jerilynn Mages.  Shick M.D.   On: 06/26/2016 09:54   Ir Angiogram Visceral Selective  Result Date: 06/26/2016 INDICATION: Acute lower GI bleeding requiring transfusions EXAM: ULTRASOUND GUIDANCE FOR VASCULAR ACCESS CELIAC, SMA, AND IMA CATHETERIZATIONS AND ANGIOGRAMS MEDICATIONS: 1% lidocaine locally. ANESTHESIA/SEDATION: Moderate (conscious) sedation was employed during this procedure. A total of Versed 1.0 mg and Fentanyl 25 mcg was administered intravenously. Moderate Sedation Time: 35 minutes. The patient's level of consciousness and vital signs were monitored continuously by radiology nursing throughout the procedure under my direct supervision. CONTRAST:  100 cc Isovue FLUOROSCOPY TIME:  Fluoroscopy Time: 6 minutes 24 seconds (460 mGy). COMPLICATIONS: None immediate. PROCEDURE: Informed consent was obtained from the patient following explanation of the procedure, risks, benefits and alternatives. The patient understands, agrees and consents for the procedure. All  questions were addressed. A time out was performed prior to the initiation of the procedure. Maximal barrier sterile technique utilized including caps, mask, sterile gowns, sterile gloves, large sterile drape, hand hygiene, and Betadine prep. Under sterile conditions and local anesthesia, ultrasound micropuncture access performed of the  right common femoral artery. 5 French sheath inserted over a Bentson guidewire. Initially, a C2 catheter was utilized to select the SMA. SMA angiogram performed. SMA is widely patent. Jejunal branches throughout the mesentery are patent. No evidence of active bleeding. Replaced right hepatic artery noted off of the proximal SMA, a normal variant. On the venous phase, the superior mesenteric vein and portal vein are patent. Catheter was exchanged for a Mickelson catheter. This was reformed in the aorta and utilized to select the celiac origin. Celiac angiogram performed. Celiac origin is widely patent. Mild atherosclerotic change. Splenic, left gastric, and left hepatic vasculature are all patent. The gastroduodenal artery and gastroepiploic artery are visualized and patent. The catheter was retracted and utilized to select the IMA. Selective IMA angiogram performed. IMA is mildly atherosclerotic but patent. The superior hemorrhoidal and left colic branches are all patent. No evidence of active bleeding. On the venous phase, the IMV is patent. Access removed. Hemostasis obtained with the Exoseal device. No immediate complication. Patient tolerated the procedure well. IMPRESSION: Successful mesenteric angiograms of the celiac, SMA and IMA without evidence of active GI bleeding. Electronically Signed   By: Jerilynn Mages.  Shick M.D.   On: 06/26/2016 09:54   Ir US Guide Vasc Access Right  Result Date: 06/26/2016 INDICATION: Acute lower GI bleeding requiring transfusions EXAM: ULTRASOUND GUIDANCE FOR VASCULAR ACCESS CELIAC, SMA, AND IMA CATHETERIZATIONS AND ANGIOGRAMS MEDICATIONS: 1% lidocaine  locally. ANESTHESIA/SEDATION: Moderate (conscious) sedation was employed during this procedure. A total of Versed 1.0 mg and Fentanyl 25 mcg was administered intravenously. Moderate Sedation Time: 35 minutes. The patient's level of consciousness and vital signs were monitored continuously by radiology nursing throughout the procedure under my direct supervision. CONTRAST:  100 cc Isovue FLUOROSCOPY TIME:  Fluoroscopy Time: 6 minutes 24 seconds (460 mGy). COMPLICATIONS: None immediate. PROCEDURE: Informed consent was obtained from the patient following explanation of the procedure, risks, benefits and alternatives. The patient understands, agrees and consents for the procedure. All questions were addressed. A time out was performed prior to the initiation of the procedure. Maximal barrier sterile technique utilized including caps, mask, sterile gowns, sterile gloves, large sterile drape, hand hygiene, and Betadine prep. Under sterile conditions and local anesthesia, ultrasound micropuncture access performed of the right common femoral artery. 5 French sheath inserted over a Bentson guidewire. Initially, a C2 catheter was utilized to select the SMA. SMA angiogram performed. SMA is widely patent. Jejunal branches throughout the mesentery are patent. No evidence of active bleeding. Replaced right hepatic artery noted off of the proximal SMA, a normal variant. On the venous phase, the superior mesenteric vein and portal vein are patent. Catheter was exchanged for a Mickelson catheter. This was reformed in the aorta and utilized to select the celiac origin. Celiac angiogram performed. Celiac origin is widely patent. Mild atherosclerotic change. Splenic, left gastric, and left hepatic vasculature are all patent. The gastroduodenal artery and gastroepiploic artery are visualized and patent. The catheter was retracted and utilized to select the IMA. Selective IMA angiogram performed. IMA is mildly atherosclerotic but patent.  The superior hemorrhoidal and left colic branches are all patent. No evidence of active bleeding. On the venous phase, the IMV is patent. Access removed. Hemostasis obtained with the Exoseal device. No immediate complication. Patient tolerated the procedure well. IMPRESSION: Successful mesenteric angiograms of the celiac, SMA and IMA without evidence of active GI bleeding. Electronically Signed   By: Jerilynn Mages.  Shick M.D.   On: 06/26/2016 09:54   Scheduled Meds: . sodium chloride  Intravenous Once  . sodium chloride   Intravenous Once  . chlorhexidine  15 mL Mouth Rinse BID  . feeding supplement  1 Container Oral TID BM  . levothyroxine  112 mcg Oral QAC breakfast  . mouth rinse  15 mL Mouth Rinse q12n4p  . omega-3 acid ethyl esters  2 g Oral Daily  . pantoprazole  40 mg Oral QAC breakfast  . peg 3350 powder  0.5 kit Oral Once   And  . [START ON 06/28/2016] peg 3350 powder  0.5 kit Oral Once  . pravastatin  20 mg Oral QHS   Continuous Infusions: . sodium chloride 50 mL/hr at 06/26/16 1721    LOS: 4 days   Faye Ramsay, MD [facility] Pager [phone]  If 7PM-7AM, please contact night-coverage [email or web address] Password [email or web address] 06/27/2016, 11:03 AM

## 2016-06-27 NOTE — Progress Notes (Signed)
Patient continues to have hematochezia, no source identified on angio by IR.  Colonoscopy 06/24/16 at [facility] pen showed extensive diverticulosis in ascending colon but unable to localize site of bleeding.  [facility] requesting consult for repeat colonoscopy to see if we can localize the site of bleeding. Will plan for bowel prep today and colonoscopy tomorrow AM tentatively  Full consult note to follow  K. Denzil Magnuson , MD [phone] Mon-Fri 8a-5p [phone] after 5p, weekends, holidays

## 2016-06-27 NOTE — Progress Notes (Addendum)
Patient has had another large frank bloody stool. MD texted paged. Will continue to monitor patient and await further instructions. Patient c/o of lightheadedness, dizziness, and severe stomach pain. BP 106/61 (75) HR 95.   1:19 AM: STAT CBC ordered and has been collected. RBC have been prepared to be administered pending CBC results. Will continue to monitor.

## 2016-06-27 NOTE — Progress Notes (Signed)
Patient BP dropped to 86/62 (70), HR in 110s-120s, patient diaphoretic and lethargic. Laverda Page, NP made aware and 1L bolus ordered and has been started along with first unit of RBC. Will continue to monitor BP frequently and patient's status.  Lenna Sciara, RN

## 2016-06-27 NOTE — Progress Notes (Signed)
Referring Physician(s): Dr Cathlean Sauer  Supervising Physician: Arne Cleveland  Patient Status:  [facility] - In-pt  Chief Complaint:  GI Bleed Arteriogram 4/4---No source found  Subjective:  Large bloody BM 1250 am today 2 units infusing Hg 6.9 today Pt seems weak Answering all questions appropriately  Allergies: Patient has no known allergies.  Medications: Prior to Admission medications   Medication Sig Start Date End Date Taking? Authorizing Provider  fish oil-omega-3 fatty acids 1000 MG capsule Take 2 g by mouth daily.   Yes Historical Provider, MD  levothyroxine (SYNTHROID, LEVOTHROID) 112 MCG tablet Take 112 mcg by mouth every morning.   Yes Historical Provider, MD  Multiple Vitamin (MULTIVITAMIN WITH MINERALS) TABS tablet Take 1 tablet by mouth daily.   Yes Historical Provider, MD  omeprazole (PRILOSEC) 20 MG capsule TAKE 1 CAPSULE EVERY DAY 12/27/15  Yes Mahala Menghini, PA-C  pravastatin (PRAVACHOL) 20 MG tablet Take 20 mg by mouth at bedtime.   Yes Historical Provider, MD     Vital Signs: BP 106/63   Pulse 98   Temp 98.4 F (36.9 C) (Oral)   Resp 17   Ht 6\' 2"  (1.88 m)   Wt 178 lb 2.1 oz (80.8 kg)   SpO2 98%   BMI 22.87 kg/m   Physical Exam  Constitutional: He is oriented to person, place, and time.  Abdominal: Soft. Bowel sounds are normal. There is no tenderness.  Musculoskeletal: Normal range of motion.  Neurological: He is alert and oriented to person, place, and time.  Skin: Skin is warm and dry.  Right groin NT No bleeding No hematoma Rt foot 2+ pulses  Psychiatric: He has a normal mood and affect.  Nursing note and vitals reviewed.   Imaging: Ct Head Wo Contrast  Result Date: 06/23/2016 CLINICAL DATA:  Code stroke, LEFT facial and LEFT arm numbness, rectal bleeding, history ulcer disease, colonic diverticulosis EXAM: CT HEAD WITHOUT CONTRAST TECHNIQUE: Contiguous axial images were obtained from the base of the skull through the vertex without  intravenous contrast. Sagittal and coronal MPR images reconstructed from axial data set. COMPARISON:  None FINDINGS: Brain: Mild generalized atrophy. Normal ventricular morphology. No midline shift or mass effect. Otherwise normal appearance of brain parenchyma. No intracranial hemorrhage, mass lesion or evidence acute infarction. No extra-axial fluid collections. Vascular: Unremarkable Skull: Intact Sinuses/Orbits: Clear Other: N/A IMPRESSION: No acute intracranial abnormality. Findings called to [facility] in ICU on 06/23/2016 at 1620 hours. Electronically Signed   By: Lavonia Dana M.D.   On: 06/23/2016 16:23   Nm Gi Blood Loss  Result Date: 06/26/2016 CLINICAL DATA:  71 y/o M; bright red blood per rectum and lower abdominal pain for several days. Bloody stools 1.5 hours prior to imaging. EXAM: NUCLEAR MEDICINE GASTROINTESTINAL BLEEDING SCAN TECHNIQUE: Sequential abdominal images were obtained following intravenous administration of Tc-71m labeled red blood cells. RADIOPHARMACEUTICALS:  20.7 mCi Tc-31m in-vitro labeled red cells. COMPARISON:  06/22/2016 CT of the abdomen and pelvis. FINDINGS: On dynamic imaging there is progressive radiotracer accumulation within the bladder. Inferiorly at the level of the pelvic brim there is a second focus of radiotracer uptake with variable activity. No abnormal radiotracer uptake is seen passing through small or large bowel within the abdomen. IMPRESSION: Inferiorly at level of pelvic brim there is a focus of radiotracer uptake with variable activity likely representing active bleeding within the rectum. No abnormal radiotracer uptake is seen passing through small or large bowel to suggest a more proximal bleed. Electronically Signed  By: Kristine Garbe M.D.   On: 06/26/2016 04:42   Ir Angiogram Visceral Selective  Result Date: 06/26/2016 INDICATION: Acute lower GI bleeding requiring transfusions EXAM: ULTRASOUND GUIDANCE FOR VASCULAR ACCESS CELIAC, SMA, AND IMA  CATHETERIZATIONS AND ANGIOGRAMS MEDICATIONS: 1% lidocaine locally. ANESTHESIA/SEDATION: Moderate (conscious) sedation was employed during this procedure. A total of Versed 1.0 mg and Fentanyl 25 mcg was administered intravenously. Moderate Sedation Time: 35 minutes. The patient's level of consciousness and vital signs were monitored continuously by radiology nursing throughout the procedure under my direct supervision. CONTRAST:  100 cc Isovue FLUOROSCOPY TIME:  Fluoroscopy Time: 6 minutes 24 seconds (460 mGy). COMPLICATIONS: None immediate. PROCEDURE: Informed consent was obtained from the patient following explanation of the procedure, risks, benefits and alternatives. The patient understands, agrees and consents for the procedure. All questions were addressed. A time out was performed prior to the initiation of the procedure. Maximal barrier sterile technique utilized including caps, mask, sterile gowns, sterile gloves, large sterile drape, hand hygiene, and Betadine prep. Under sterile conditions and local anesthesia, ultrasound micropuncture access performed of the right common femoral artery. 5 French sheath inserted over a Bentson guidewire. Initially, a C2 catheter was utilized to select the SMA. SMA angiogram performed. SMA is widely patent. Jejunal branches throughout the mesentery are patent. No evidence of active bleeding. Replaced right hepatic artery noted off of the proximal SMA, a normal variant. On the venous phase, the superior mesenteric vein and portal vein are patent. Catheter was exchanged for a Mickelson catheter. This was reformed in the aorta and utilized to select the celiac origin. Celiac angiogram performed. Celiac origin is widely patent. Mild atherosclerotic change. Splenic, left gastric, and left hepatic vasculature are all patent. The gastroduodenal artery and gastroepiploic artery are visualized and patent. The catheter was retracted and utilized to select the IMA. Selective IMA  angiogram performed. IMA is mildly atherosclerotic but patent. The superior hemorrhoidal and left colic branches are all patent. No evidence of active bleeding. On the venous phase, the IMV is patent. Access removed. Hemostasis obtained with the Exoseal device. No immediate complication. Patient tolerated the procedure well. IMPRESSION: Successful mesenteric angiograms of the celiac, SMA and IMA without evidence of active GI bleeding. Electronically Signed   By: Jerilynn Mages.  Shick M.D.   On: 06/26/2016 09:54   Ir Angiogram Visceral Selective  Result Date: 06/26/2016 INDICATION: Acute lower GI bleeding requiring transfusions EXAM: ULTRASOUND GUIDANCE FOR VASCULAR ACCESS CELIAC, SMA, AND IMA CATHETERIZATIONS AND ANGIOGRAMS MEDICATIONS: 1% lidocaine locally. ANESTHESIA/SEDATION: Moderate (conscious) sedation was employed during this procedure. A total of Versed 1.0 mg and Fentanyl 25 mcg was administered intravenously. Moderate Sedation Time: 35 minutes. The patient's level of consciousness and vital signs were monitored continuously by radiology nursing throughout the procedure under my direct supervision. CONTRAST:  100 cc Isovue FLUOROSCOPY TIME:  Fluoroscopy Time: 6 minutes 24 seconds (460 mGy). COMPLICATIONS: None immediate. PROCEDURE: Informed consent was obtained from the patient following explanation of the procedure, risks, benefits and alternatives. The patient understands, agrees and consents for the procedure. All questions were addressed. A time out was performed prior to the initiation of the procedure. Maximal barrier sterile technique utilized including caps, mask, sterile gowns, sterile gloves, large sterile drape, hand hygiene, and Betadine prep. Under sterile conditions and local anesthesia, ultrasound micropuncture access performed of the right common femoral artery. 5 French sheath inserted over a Bentson guidewire. Initially, a C2 catheter was utilized to select the SMA. SMA angiogram performed. SMA is  widely patent. Jejunal  branches throughout the mesentery are patent. No evidence of active bleeding. Replaced right hepatic artery noted off of the proximal SMA, a normal variant. On the venous phase, the superior mesenteric vein and portal vein are patent. Catheter was exchanged for a Mickelson catheter. This was reformed in the aorta and utilized to select the celiac origin. Celiac angiogram performed. Celiac origin is widely patent. Mild atherosclerotic change. Splenic, left gastric, and left hepatic vasculature are all patent. The gastroduodenal artery and gastroepiploic artery are visualized and patent. The catheter was retracted and utilized to select the IMA. Selective IMA angiogram performed. IMA is mildly atherosclerotic but patent. The superior hemorrhoidal and left colic branches are all patent. No evidence of active bleeding. On the venous phase, the IMV is patent. Access removed. Hemostasis obtained with the Exoseal device. No immediate complication. Patient tolerated the procedure well. IMPRESSION: Successful mesenteric angiograms of the celiac, SMA and IMA without evidence of active GI bleeding. Electronically Signed   By: Jerilynn Mages.  Shick M.D.   On: 06/26/2016 09:54   Ir Angiogram Visceral Selective  Result Date: 06/26/2016 INDICATION: Acute lower GI bleeding requiring transfusions EXAM: ULTRASOUND GUIDANCE FOR VASCULAR ACCESS CELIAC, SMA, AND IMA CATHETERIZATIONS AND ANGIOGRAMS MEDICATIONS: 1% lidocaine locally. ANESTHESIA/SEDATION: Moderate (conscious) sedation was employed during this procedure. A total of Versed 1.0 mg and Fentanyl 25 mcg was administered intravenously. Moderate Sedation Time: 35 minutes. The patient's level of consciousness and vital signs were monitored continuously by radiology nursing throughout the procedure under my direct supervision. CONTRAST:  100 cc Isovue FLUOROSCOPY TIME:  Fluoroscopy Time: 6 minutes 24 seconds (460 mGy). COMPLICATIONS: None immediate. PROCEDURE: Informed  consent was obtained from the patient following explanation of the procedure, risks, benefits and alternatives. The patient understands, agrees and consents for the procedure. All questions were addressed. A time out was performed prior to the initiation of the procedure. Maximal barrier sterile technique utilized including caps, mask, sterile gowns, sterile gloves, large sterile drape, hand hygiene, and Betadine prep. Under sterile conditions and local anesthesia, ultrasound micropuncture access performed of the right common femoral artery. 5 French sheath inserted over a Bentson guidewire. Initially, a C2 catheter was utilized to select the SMA. SMA angiogram performed. SMA is widely patent. Jejunal branches throughout the mesentery are patent. No evidence of active bleeding. Replaced right hepatic artery noted off of the proximal SMA, a normal variant. On the venous phase, the superior mesenteric vein and portal vein are patent. Catheter was exchanged for a Mickelson catheter. This was reformed in the aorta and utilized to select the celiac origin. Celiac angiogram performed. Celiac origin is widely patent. Mild atherosclerotic change. Splenic, left gastric, and left hepatic vasculature are all patent. The gastroduodenal artery and gastroepiploic artery are visualized and patent. The catheter was retracted and utilized to select the IMA. Selective IMA angiogram performed. IMA is mildly atherosclerotic but patent. The superior hemorrhoidal and left colic branches are all patent. No evidence of active bleeding. On the venous phase, the IMV is patent. Access removed. Hemostasis obtained with the Exoseal device. No immediate complication. Patient tolerated the procedure well. IMPRESSION: Successful mesenteric angiograms of the celiac, SMA and IMA without evidence of active GI bleeding. Electronically Signed   By: Jerilynn Mages.  Shick M.D.   On: 06/26/2016 09:54   Ir US Guide Vasc Access Right  Result Date:  06/26/2016 INDICATION: Acute lower GI bleeding requiring transfusions EXAM: ULTRASOUND GUIDANCE FOR VASCULAR ACCESS CELIAC, SMA, AND IMA CATHETERIZATIONS AND ANGIOGRAMS MEDICATIONS: 1% lidocaine locally. ANESTHESIA/SEDATION: Moderate (conscious)  sedation was employed during this procedure. A total of Versed 1.0 mg and Fentanyl 25 mcg was administered intravenously. Moderate Sedation Time: 35 minutes. The patient's level of consciousness and vital signs were monitored continuously by radiology nursing throughout the procedure under my direct supervision. CONTRAST:  100 cc Isovue FLUOROSCOPY TIME:  Fluoroscopy Time: 6 minutes 24 seconds (460 mGy). COMPLICATIONS: None immediate. PROCEDURE: Informed consent was obtained from the patient following explanation of the procedure, risks, benefits and alternatives. The patient understands, agrees and consents for the procedure. All questions were addressed. A time out was performed prior to the initiation of the procedure. Maximal barrier sterile technique utilized including caps, mask, sterile gowns, sterile gloves, large sterile drape, hand hygiene, and Betadine prep. Under sterile conditions and local anesthesia, ultrasound micropuncture access performed of the right common femoral artery. 5 French sheath inserted over a Bentson guidewire. Initially, a C2 catheter was utilized to select the SMA. SMA angiogram performed. SMA is widely patent. Jejunal branches throughout the mesentery are patent. No evidence of active bleeding. Replaced right hepatic artery noted off of the proximal SMA, a normal variant. On the venous phase, the superior mesenteric vein and portal vein are patent. Catheter was exchanged for a Mickelson catheter. This was reformed in the aorta and utilized to select the celiac origin. Celiac angiogram performed. Celiac origin is widely patent. Mild atherosclerotic change. Splenic, left gastric, and left hepatic vasculature are all patent. The gastroduodenal  artery and gastroepiploic artery are visualized and patent. The catheter was retracted and utilized to select the IMA. Selective IMA angiogram performed. IMA is mildly atherosclerotic but patent. The superior hemorrhoidal and left colic branches are all patent. No evidence of active bleeding. On the venous phase, the IMV is patent. Access removed. Hemostasis obtained with the Exoseal device. No immediate complication. Patient tolerated the procedure well. IMPRESSION: Successful mesenteric angiograms of the celiac, SMA and IMA without evidence of active GI bleeding. Electronically Signed   By: Jerilynn Mages.  Shick M.D.   On: 06/26/2016 09:54    Labs:  CBC:  Recent Labs  06/26/16 0954 06/26/16 1417 06/26/16 1941 06/27/16 0101  WBC 14.1* 12.8* 12.9* 12.0*  HGB 7.7* 7.7* 7.4* 6.9*  HCT 21.8* 22.2* 21.2* 19.6*  PLT 90* 93* 104* 107*    COAGS:  Recent Labs  06/22/16 2050  INR 0.97    BMP:  Recent Labs  06/22/16 2050 06/22/16 2052 06/23/16 1307 06/24/16 0423  NA 135 137 139 137  K 3.0* 5.6* 3.5 3.7  CL 102 107 111 111  CO2 24  --  23 23  GLUCOSE 167* 146* 148* 128*  BUN 18 24* 13 10  CALCIUM 8.7*  --  8.1* 7.8*  CREATININE 1.27* 1.20 0.83 0.71  GFRNONAA 56*  --  >60 >60  GFRAA >60  --  >60 >60    LIVER FUNCTION TESTS:  Recent Labs  06/22/16 2050 06/23/16 1307  BILITOT 0.5 0.7  AST 18 20  ALT 16* 13*  ALKPHOS 50 38  PROT 6.5 5.2*  ALBUMIN 3.6 2.9*    Assessment and Plan:  Continued bleeding per rectum No source on arteriogram 4/4 Plan per TRH/GI  Electronically Signed: Aldine Chakraborty A 06/27/2016, 7:46 AM   I spent a total of 15 Minutes at the the patient's bedside AND on the patient's hospital floor or unit, greater than 50% of which was counseling/coordinating care for GI bleed

## 2016-06-27 NOTE — Care Management Important Message (Signed)
Important Message  Patient Details  Name: Sean Leonard MRN: [id number] Date of Birth: Jul 03, 1945   Medicare Important Message Given:  Yes    [name] 06/27/2016, 11:32 AM

## 2016-06-28 ENCOUNTER — Inpatient Hospital Stay (HOSPITAL_COMMUNITY): Payer: Medicare HMO | Admitting: Certified Registered Nurse Anesthetist

## 2016-06-28 ENCOUNTER — Encounter (HOSPITAL_COMMUNITY)
Admission: EM | Disposition: A | Discharge status: Home / Self Care | Payer: Self-pay | Source: Home / Self Care | Attending: Internal Medicine

## 2016-06-28 ENCOUNTER — Encounter (HOSPITAL_COMMUNITY): Payer: Self-pay | Admitting: *Deleted

## 2016-06-28 DIAGNOSIS — K921 Melena: Secondary | ICD-10-CM

## 2016-06-28 DIAGNOSIS — R109 Unspecified abdominal pain: Secondary | ICD-10-CM

## 2016-06-28 DIAGNOSIS — K633 Ulcer of intestine: Secondary | ICD-10-CM

## 2016-06-28 DIAGNOSIS — K5731 Diverticulosis of large intestine without perforation or abscess with bleeding: Principal | ICD-10-CM

## 2016-06-28 HISTORY — PX: COLONOSCOPY WITH PROPOFOL: SHX5780

## 2016-06-28 LAB — BASIC METABOLIC PANEL
Anion gap: 5 (ref 5–15)
BUN: 8 mg/dL (ref 6–20)
CHLORIDE: 105 mmol/L (ref 101–111)
CO2: 24 mmol/L (ref 22–32)
CREATININE: 0.72 mg/dL (ref 0.61–1.24)
Calcium: 7.3 mg/dL — ABNORMAL LOW (ref 8.9–10.3)
GFR calc Af Amer: >60 mL/min (ref >60)
GFR calc non Af Amer: >60 mL/min (ref >60)
Glucose, Bld: 104 mg/dL — ABNORMAL HIGH (ref 65–99)
Potassium: 3.5 mmol/L (ref 3.5–5.1)
SODIUM: 134 mmol/L — AB (ref 135–145)

## 2016-06-28 LAB — CULTURE, BLOOD (ROUTINE X 2)
CULTURE: NO GROWTH
Culture: NO GROWTH
Special Requests: ADEQUATE

## 2016-06-28 LAB — CBC
HCT: 20.6 % — ABNORMAL LOW (ref 39.0–52.0)
HCT: 22.4 % — ABNORMAL LOW (ref 39.0–52.0)
Hemoglobin: 7.1 g/dL — ABNORMAL LOW (ref 13.0–17.0)
Hemoglobin: 7.6 g/dL — ABNORMAL LOW (ref 13.0–17.0)
MCH: 30.2 pg (ref 26.0–34.0)
MCH: 30 pg (ref 26.0–34.0)
MCHC: 34.5 g/dL (ref 30.0–36.0)
MCHC: 33.9 g/dL (ref 30.0–36.0)
MCV: 87.7 fL (ref 78.0–100.0)
MCV: 88.5 fL (ref 78.0–100.0)
PLATELETS: 147 10*3/uL — AB (ref 150–400)
PLATELETS: 141 10*3/uL — AB (ref 150–400)
RBC: 2.35 MIL/uL — ABNORMAL LOW (ref 4.22–5.81)
RBC: 2.53 MIL/uL — ABNORMAL LOW (ref 4.22–5.81)
RDW: 15.3 % (ref 11.5–15.5)
RDW: 15.3 % (ref 11.5–15.5)
WBC: 12.6 10*3/uL — AB (ref 4.0–10.5)
WBC: 7.5 10*3/uL (ref 4.0–10.5)

## 2016-06-28 LAB — PREPARE RBC (CROSSMATCH)

## 2016-06-28 LAB — GLUCOSE, CAPILLARY: Glucose-Capillary: 103 mg/dL — ABNORMAL HIGH (ref 65–99)

## 2016-06-28 SURGERY — COLONOSCOPY WITH PROPOFOL
Anesthesia: Monitor Anesthesia Care

## 2016-06-28 MED ORDER — PROPOFOL 500 MG/50ML IV EMUL
INTRAVENOUS | Status: DC | PRN
  Administered 2016-06-28: 75 ug/kg/min via INTRAVENOUS

## 2016-06-28 MED ORDER — LIDOCAINE 2% (20 MG/ML) 5 ML SYRINGE
INTRAMUSCULAR | Status: DC | PRN
  Administered 2016-06-28: 20 mg via INTRAVENOUS

## 2016-06-28 MED ORDER — SODIUM CHLORIDE 0.9 % IV SOLN
Freq: Once | INTRAVENOUS | Status: AC
  Administered 2016-06-28: 18:00:00 via INTRAVENOUS

## 2016-06-28 MED ORDER — SODIUM CHLORIDE 0.9 % IV SOLN: INTRAVENOUS | Status: DC

## 2016-06-28 MED ORDER — GLYCOPYRROLATE 0.2 MG/ML IV SOSY
PREFILLED_SYRINGE | INTRAVENOUS | Status: DC | PRN
  Administered 2016-06-28: .2 mg via INTRAVENOUS

## 2016-06-28 MED ORDER — PROPOFOL 10 MG/ML IV BOLUS
INTRAVENOUS | Status: DC | PRN
  Administered 2016-06-28 (×2): 20 mg via INTRAVENOUS

## 2016-06-28 MED ORDER — LACTATED RINGERS IV SOLN
INTRAVENOUS | Status: DC | PRN
  Administered 2016-06-28: 10:00:00 via INTRAVENOUS

## 2016-06-28 NOTE — Progress Notes (Signed)
PROGRESS NOTE  Sean Leonard  NTZ:[id number] DOB: [birth year] DOA: 06/22/2016   PCP: Pcp Not In System   Brief Narrative:  71 y.o. male with hx of known diverticulosis with colonoscopy by Dr Felipe Drone 3 years ago, hx of Barretts' s/p EGD same time, DDD, hypothyrodism, bilateral iliac artery aneurysm, presented to the ER with BRBPR. He denied using NSAIDS, and had not been on ASA due to his prior hx of PUD.  Hb was found to be at 12.2g per dl, normal BUN, and normal INR.  BP on presentation was in the 70's. CT of the abd pelvis was done showing showing enlarged pancreatic duct, suspicious for chronic enlargement, and diverticulosis, with internal hemorrhoid.  Colonoscopy was done at [facility].  Patient re-bled and was sent to [facility] for bleeding scan and then IR for angiogram.  No source of bleeding found to this point.    Assessment & Plan:   Lower GI bleed -Colonoscopy- 4/2 by GI- Dr. Oneida Alar- Polyp, ascending colon diverticulosis had further bleeding got tagged blood cell scan/angiography but no source of bleeding found -s/p 10units PRBC this admission -surgery consulting and subtotal colectomy felt to be last resort -GI following, plan for colonoscopy today -transfused two U PRBC this AM 4/5, will transfuse 1 unit PRBC now  Syncopal episode, Hypotension - several vasovagal episodes after bowel movement - BP stable now  Left upper extremity numbness- resolved, head CT neg for intracranial bleed, - No further stroke workup  ABLA, thrombocytopenia  - has been given FFP and PRBC (appears to be 11 units of PRBC) - transfuse another unit today  Leukocytosis - reactive, monitor  Hx of Barretts and PUD - No NSAIDS use and no ASA.  No evidence of UGIB.  DVT prophylaxis:  SCDs  Code Status: Full Family Communication: wife at bedside  Disposition Plan: remain in SDU  Consultants:   Gastroenterology  Procedures:   Colonoscopy.  Bleeding scan  Angiogram   Antimicrobials:     None   Subjective: Still having rectal bleeding, colonoscopy   Objective: Vitals:   06/28/16 1141 06/28/16 1200 06/28/16 1226 06/28/16 1300  BP: 116/73 118/68 118/68 112/75  Pulse: 88 94 92 88  Resp: 17 (!) 21 15 14   Temp:   98.4 F (36.9 C)   TempSrc:   Oral   SpO2: 99% 100% 100% 99%  Weight:      Height:        Intake/Output Summary (Last 24 hours) at 06/28/16 1442 Last data filed at 06/28/16 1252  Gross per 24 hour  Intake          1571.67 ml  Output              550 ml  Net          1021.67 ml   Filed Weights   06/24/16 1048 06/25/16 0500 06/25/16 1733  Weight: 78.5 kg (173 lb) 80.9 kg (178 lb 5.6 oz) 80.8 kg (178 lb 2.1 oz)    Examination:  General exam: Alert, awake, oriented x3 Respiratory system: Clear to auscultation. Respiratory effort normal. Cardiovascular system: S1 & S2 heard, RRR. No JVD, murmurs, rubs, gallops or clicks. No pedal edema. Gastrointestinal system: mildly tender, + BS Central nervous system: A+Ox3, NAD Extremities: Symmetric 5 x 5 power. Skin: No rashes, lesions or ulcers  Data Reviewed: I have personally reviewed following labs and imaging studies  CBC:  Recent Labs Lab 06/22/16 2050  06/27/16 0101 06/27/16 1153 06/27/16 1443 06/27/16 2029  06/28/16 0733  WBC 10.0  < > 12.0* 12.8* 14.2* 14.1* 12.6*  NEUTROABS 3.0  --   --   --   --   --   --   HGB 11.7*  < > 6.9* 7.3* 7.6* 7.1* 7.1*  HCT 35.7*  < > 19.6* 20.9* 22.1* 20.4* 20.6*  MCV 90.8  < > 87.1 86.7 86.3 86.4 87.7  PLT 243  < > 107* 95* 109* 119* 147*  < > = values in this interval not displayed. Basic Metabolic Panel:  Recent Labs Lab 06/22/16 2050 06/22/16 2052 06/23/16 1307 06/24/16 0423 06/27/16 1153 06/28/16 0306  NA 135 137 139 137 137  136 134*  K 3.0* 5.6* 3.5 3.7 3.6  3.5 3.5  CL 102 107 111 111 112*  111 105  CO2 24  --  23 23 21*  21* 24  GLUCOSE 167* 146* 148* 128* 86  87 104*  BUN 18 24* 13 10 10  9 8   CREATININE 1.27* 1.20 0.83 0.71  0.71  0.69 0.72  CALCIUM 8.7*  --  8.1* 7.8* 7.0*  6.9* 7.3*  MG  --   --  1.8  --   --   --    Liver Function Tests:  Recent Labs Lab 06/22/16 2050 06/23/16 1307 06/27/16 1153  AST 18 20 16   ALT 16* 13* 10*  ALKPHOS 50 38 27*  BILITOT 0.5 0.7 0.6  PROT 6.5 5.2* 3.4*  ALBUMIN 3.6 2.9* 1.7*    Recent Labs Lab 06/22/16 2050  LIPASE 33   Coagulation Profile:  Recent Labs Lab 06/22/16 2050  INR 0.97   Cardiac Enzymes:  Recent Labs Lab 06/22/16 2050 06/23/16 1307  TROPONINI <0.03 <0.03   CBG:  Recent Labs Lab 06/23/16 1234 06/24/16 0635 06/25/16 1416  GLUCAP 152* 139* 108*   Sepsis Labs:  Recent Labs Lab 06/22/16 2056 06/23/16 0900 06/23/16 1500  LATICACIDVEN 2.76* 2.5* 1.9    Recent Results (from the past 240 hour(s))  Culture, blood (Routine x 2)     Status: None   Collection Time: 06/22/16  9:08 PM  Result Value Ref Range Status   Specimen Description BLOOD LEFT ARM  Final   Special Requests BOTTLES DRAWN AEROBIC AND ANAEROBIC 6CC EACH  Final   Culture NO GROWTH 5 DAYS  Final   Report Status 06/27/2016 FINAL  Final  Culture, blood (Routine x 2)     Status: None   Collection Time: 06/22/16  9:25 PM  Result Value Ref Range Status   Specimen Description BLOOD RIGHT ARM  Final   Special Requests BOTTLES DRAWN AEROBIC AND ANAEROBIC 4CC EACH  Final   Culture NO GROWTH 5 DAYS  Final   Report Status 06/27/2016 FINAL  Final  Culture, blood (routine x 2)     Status: None   Collection Time: 06/23/16  1:08 PM  Result Value Ref Range Status   Specimen Description BLOOD  Final   Special Requests   Final    Blood Culture results may not be optimal due to an inadequate volume of blood received in culture bottles   Culture NO GROWTH 5 DAYS  Final   Report Status 06/28/2016 FINAL  Final  Culture, blood (routine x 2)     Status: None   Collection Time: 06/23/16  1:09 PM  Result Value Ref Range Status   Specimen Description BLOOD  Final   Special  Requests Blood Culture adequate volume  Final   Culture NO GROWTH 5  DAYS  Final   Report Status 06/28/2016 FINAL  Final  MRSA PCR Screening     Status: None   Collection Time: 06/23/16  7:47 PM  Result Value Ref Range Status   MRSA by PCR NEGATIVE NEGATIVE Final    Comment:        The GeneXpert MRSA Assay (FDA approved for NASAL specimens only), is one component of a comprehensive MRSA colonization surveillance program. It is not intended to diagnose MRSA infection nor to guide or monitor treatment for MRSA infections.     Radiology Studies: Ct Angio Abd/pel W/ And/or W/o  Result Date: 06/27/2016 CLINICAL DATA:  Lower abdominal pain, acute lower GI bleeding EXAM: CT ANGIOGRAPHY ABDOMEN AND PELVIS TECHNIQUE: Multidetector CT imaging of the abdomen and pelvis was performed using the standard protocol during bolus administration of intravenous contrast. Multiplanar reconstructed images including MIPs were obtained and reviewed to evaluate the vascular anatomy. CONTRAST:  100 cc Isovue 350 COMPARISON:  06/22/2016, 06/26/2016 FINDINGS: Arterial findings: Aorta: Mild tortuosity and atherosclerotic changes of the abdominal aorta. Infrarenal aorta maximal AP diameter 2.7 cm and transverse 2.5 cm. No occlusive process, acute dissection, or retroperitoneal hemorrhage. Celiac axis: Widely patent origin and branches. No acute vascular finding or bleeding. Superior mesenteric: Widely patent origin and branches. Replaced right hepatic artery noted. No active contrast extravasation or bleeding demonstrated. Left renal:           Widely patent Right renal:          Atherosclerotic changes but widely patent Inferior mesenteric: Atherosclerotic origin but remains patent. No active GI bleeding within the IMA vascular distribution. Left iliac: Atherosclerotic changes and mild ectasia of the left common iliac artery. Left common iliac diameter is 14 mm. Mild aneurysmal dilatation of the left internal iliac artery  measuring 13 mm in diameter, image 98. Left common, internal and external iliac arteries remain patent. No occlusive process. Right iliac: Atherosclerotic changes and aneurysmal dilatation of the right common iliac artery, maximal diameter 2.2 cm, image 88. Aneurysm dilatation also noted of the right internal iliac artery measuring 16 mm, image 100. Right iliac vessels remain patent without occlusive process. Visualized common femoral, proximal profunda femoral, and proximal superficial femoral arteries are also patent. Venous findings: Hepatic, portal, splenic, and superior mesenteric veins are all patent. IVC and renal veins are patent. Iliac and proximal femoral veins appear patent. No acute veno-occlusive process. Review of the MIP images confirms the above findings. Nonvascular findings: Minimal dependent bibasilar atelectasis and trace pleural effusions. Normal heart size. No pericardial effusion. Liver, gallbladder, biliary system demonstrate no focal abnormality. No biliary duct dilatation. The pancreas demonstrates no focal abnormality, ductal dilatation or surrounding inflammatory process. Spleen is within normal limits. Adrenal glands demonstrate no acute abnormality. Kidneys demonstrate symmetric enhancement and excretion. No renal obstruction or hydronephrosis. Small hypodense cortical cysts in the right kidney mid and lower pole regions measuring 12 mm or less in size. Negative for bowel obstruction, significant dilatation, or free air. No fluid collection or abscess. Negative for ascites. Again, no evidence of active contrast extravasation within the bowel. Prostate gland is enlarged. Seminal vesicles are symmetric. Urinary bladder unremarkable. No inguinal abnormality or hernia.  Intact abdominal wall. Degenerative changes noted of the spine diffusely but most pronounced from L2-S1. IMPRESSION: Negative for significant active GI bleeding by CTA. Mesenteric vessels remain patent. Aortoiliac  atherosclerosis with mild infrarenal aortic ectasia and small iliac aneurysms noted. No vascular occlusive process. No other acute intra-abdominal or pelvic finding. Trace  bibasilar atelectasis and pleural effusions. Electronically Signed   By: Jerilynn Mages.  Shick M.D.   On: 06/27/2016 14:49   Scheduled Meds: . sodium chloride   Intravenous Once  . chlorhexidine  15 mL Mouth Rinse BID  . feeding supplement  1 Container Oral TID BM  . levothyroxine  112 mcg Oral QAC breakfast  . mouth rinse  15 mL Mouth Rinse q12n4p  . omega-3 acid ethyl esters  2 g Oral Daily  . pantoprazole  40 mg Oral QAC breakfast  . pravastatin  20 mg Oral QHS   Continuous Infusions:   LOS: 5 days   Domenic Polite, MD [facility] Pager [phone]  If 7PM-7AM, please contact night-coverage [email or web address] Password [email or web address] 06/28/2016, 2:42 PM

## 2016-06-28 NOTE — H&P (View-Only) (Signed)
Referring Provider: No ref. provider found Primary Care Physician:  Pcp Not In System Primary Gastroenterologist:  Dr. Sydell Axon  Reason for Consultation:  LGIB  HPI: Sean Leonard is a 71 y.o. male with hx of known diverticulosis with colonoscopy by Dr Felipe Drone 3 years ago, hx of Barretts' s/p EGD same time, DDD, hypothyrodism, bilateral iliac artery aneurysm.  He presented to the ER at [facility] with BRBPR on 3/31.  Had colonoscopy 4/2 at [facility] with blood in right colon (results below).  Patient transferred here for IR for ongoing bleeding.  Bleeding scan positive for pelvic brim/rectum.  IR angio unable to identify source.  Patient re-bled early this AM.  CT angio results pending from this AM.  I believe that he has been given 11 units PRBC's and some FFP as well.  Most recent Hgb 7.3 grams.  Colonoscopy by Dr. Oneida Alar on 4/2:  The terminal ileum contained red blood. A 6 mm polyp was found in the cecum. The polyp was sessile. The polyp was removed with a hot snare. Resection and retrieval were complete. The recto-sigmoid colon, sigmoid colon and descending colon were moderately redundant. Multiple small and large-mouthed diverticula were found in the ascending colon. External and internal hemorrhoids were found during retroflexion.  Surgery has been consulted as well and is following the patient.  IR, surgery, and hospitalists requesting repeat colonoscopy prior to considering other intervention.  Past Medical History:  Diagnosis Date  . DDD (degenerative disc disease), lumbar   . Diverticula of colon   . Iliac artery aneurysm, bilateral (HCC)    with significant stenosis of right internal iliac artery  . Peptic ulcer   . Prostatitis   . Thyroid disease     Past Surgical History:  Procedure Laterality Date  . COLONOSCOPY  April 2005   Dr. Irving Shows. Small internal hemorrhoids. Multiple small scattered diverticula in the cecum and descending colon.  . COLONOSCOPY N/A 01/18/2014   Procedure: COLONOSCOPY;  Surgeon: Daneil Dolin, MD;  Location: [facility];  Service: Endoscopy;  Laterality: N/A;  1100  . COLONOSCOPY N/A 06/23/2016   Procedure: COLONOSCOPY;  Surgeon: Rogene Houston, MD;  Location: [facility];  Service: Endoscopy;  Laterality: N/A;  . COLONOSCOPY WITH ESOPHAGOGASTRODUODENOSCOPY (EGD) N/A 12/16/2012   Dr. Gala Romney: rectal and colonic polyps with inadequate preparation, tubular adenoma. EGD with biopsy proven short segment Barrett's esophagus, hiatal hernia, mild chronic inactive gastritis   . COLONOSCOPY WITH PROPOFOL N/A 06/24/2016   Procedure: COLONOSCOPY WITH PROPOFOL;  Surgeon: Danie Binder, MD;  Location: [facility];  Service: Endoscopy;  Laterality: N/A;  . ESOPHAGOGASTRODUODENOSCOPY  July 2005   Dr. Irving Shows grade 2 esophagitis at the GE junction. Acute gastritis. Multiple acute active, deep, irregular, friable ulcers in the antrum and body of the stomach. 2 active, deep, friable, irregular ulcers in the duodenum. Pathology not available.  . ESOPHAGOGASTRODUODENOSCOPY N/A 01/18/2014   Procedure: ESOPHAGOGASTRODUODENOSCOPY (EGD);  Surgeon: Daneil Dolin, MD;  Location: [facility];  Service: Endoscopy;  Laterality: N/A;  . IR ANGIOGRAM VISCERAL SELECTIVE  06/26/2016  . IR ANGIOGRAM VISCERAL SELECTIVE  06/26/2016  . IR ANGIOGRAM VISCERAL SELECTIVE  06/26/2016  . IR US GUIDE VASC ACCESS RIGHT  06/26/2016  . POLYPECTOMY  06/24/2016   Procedure: POLYPECTOMY;  Surgeon: Danie Binder, MD;  Location: [facility];  Service: Endoscopy;;  cecal  . THYROIDECTOMY    . TONSILLECTOMY      Prior to Admission medications  Medication Sig Start Date End Date Taking? Authorizing Provider  fish oil-omega-3 fatty acids 1000 MG capsule Take 2 g by mouth daily.   Yes Historical Provider, MD  levothyroxine (SYNTHROID, LEVOTHROID) 112 MCG tablet Take 112 mcg by mouth every morning.   Yes Historical Provider, MD  Multiple Vitamin (MULTIVITAMIN WITH MINERALS) TABS tablet  Take 1 tablet by mouth daily.   Yes Historical Provider, MD  omeprazole (PRILOSEC) 20 MG capsule TAKE 1 CAPSULE EVERY DAY 12/27/15  Yes Mahala Menghini, PA-C  pravastatin (PRAVACHOL) 20 MG tablet Take 20 mg by mouth at bedtime.   Yes Historical Provider, MD    Current Facility-Administered Medications  Medication Dose Route Frequency Provider Last Rate Last Dose  . 0.9 %  sodium chloride infusion   Intravenous Continuous Waldemar Dickens, MD 50 mL/hr at 06/27/16 1210    . 0.9 %  sodium chloride infusion   Intravenous Once Jessica U Vann, DO      . 0.9 %  sodium chloride infusion   Intravenous Once Gardiner Barefoot, NP      . chlorhexidine (PERIDEX) 0.12 % solution 15 mL  15 mL Mouth Rinse BID Waldemar Dickens, MD   15 mL at 06/27/16 0827  . feeding supplement (BOOST / RESOURCE BREEZE) liquid 1 Container  1 Container Oral TID BM Orvan Falconer, MD   1 Container at 06/25/16 2000  . levothyroxine (SYNTHROID, LEVOTHROID) tablet 112 mcg  112 mcg Oral QAC breakfast Orvan Falconer, MD   112 mcg at 06/27/16 [phone]  . MEDLINE mouth rinse  15 mL Mouth Rinse q12n4p Waldemar Dickens, MD      . morphine 2 MG/ML injection 2 mg  2 mg Intravenous Q2H PRN Gardiner Barefoot, NP   2 mg at 06/27/16 0110  . omega-3 acid ethyl esters (LOVAZA) capsule 2 g  2 g Oral Daily Orvan Falconer, MD   2 g at 06/25/16 1053  . pantoprazole (PROTONIX) EC tablet 40 mg  40 mg Oral QAC breakfast Danie Binder, MD   40 mg at 06/27/16 0826  . peg 3350 powder (MOVIPREP) kit 100 g  0.5 kit Oral Once Theodis Blaze, MD       And  . Derrill Memo ON 06/28/2016] peg 3350 powder (MOVIPREP) kit 100 g  0.5 kit Oral Once Theodis Blaze, MD      . pravastatin (PRAVACHOL) tablet 20 mg  20 mg Oral QHS Orvan Falconer, MD   20 mg at 06/26/16 2200    Allergies as of 06/22/2016  . (No Known Allergies)    Family History  Problem Relation Age of Onset  . Hypertension Mother   . Heart disease Mother     before age 8  . Heart disease Father     before age 65  .  Hyperlipidemia Sister   . Cancer Sister   . Colon cancer Neg Hx   . Liver disease Neg Hx     Social History   Social History  . Marital status: Married    Spouse name: N/A  . Number of children: N/A  . Years of education: N/A   Occupational History  . Not on file.   Social History Main Topics  . Smoking status: Former Smoker    Years: 25.00    Types: Cigars    Quit date: 11/27/2006  . Smokeless tobacco: Never Used     Comment: Quit in 2009  . Alcohol use No     Comment: former.  weekend drinker (liqour) before, May 2015 last time he drank during a Therapist, nutritional.   . Drug use: No  . Sexual activity: Not on file   Other Topics Concern  . Not on file   Social History Narrative   1 deceased child. 1 living    Review of Systems:   Physical Exam: Vital signs in last 24 hours: Temp:  [98.2 F (36.8 C)-98.8 F (37.1 C)] 98.8 F (37.1 C) (04/05 1022) Pulse Rate:  [84-109] 91 (04/05 1200) Resp:  [15-23] 16 (04/05 1200) BP: (86-115)/(61-74) 107/73 (04/05 1200) SpO2:  [97 %-100 %] 99 % (04/05 1200) Last BM Date: 06/27/16 General:  Alert, Well-developed, well-nourished, pleasant and cooperative in NAD Head:  Normocephalic and atraumatic. Eyes:  Sclera clear, no icterus.  Conjunctiva pink. Ears:  Normal auditory acuity. Mouth:  No deformity or lesions.   Lungs:  Clear throughout to auscultation.  No wheezes, crackles, or rhonchi.  No increased WOB. Heart:  Regular rate and rhythm; no murmurs, clicks, rubs, or gallops. Abdomen:  Soft, non-distended.  BS present.  Mild lower abdominal TTP. Rectal:  Deferred  Msk:  Symmetrical without gross deformities. Pulses:  Normal pulses noted. Extremities:  Without clubbing or edema. Neurologic:  Alert and oriented x 4;  grossly normal neurologically. Skin:  Intact without significant lesions or rashes. Psych:  Alert and cooperative. Normal mood and affect.  Intake/Output from previous day: 04/04 0701 - 04/05 0700 In: 2095  [P.O.:360; I.V.:1400; Blood:335] Out: 352 [Urine:350; Stool:2] Intake/Output this shift: Total I/O In: 230 [I.V.:200; Blood:30] Out: 500 [Urine:500]  Lab Results:  Recent Labs  06/26/16 1941 06/27/16 0101 06/27/16 1153  WBC 12.9* 12.0* 12.8*  HGB 7.4* 6.9* 7.3*  HCT 21.2* 19.6* 20.9*  PLT 104* 107* 95*   Studies/Results: Nm Gi Blood Loss  Result Date: 06/26/2016 CLINICAL DATA:  71 y/o M; bright red blood per rectum and lower abdominal pain for several days. Bloody stools 1.5 hours prior to imaging. EXAM: NUCLEAR MEDICINE GASTROINTESTINAL BLEEDING SCAN TECHNIQUE: Sequential abdominal images were obtained following intravenous administration of Tc-20mlabeled red blood cells. RADIOPHARMACEUTICALS:  20.7 mCi Tc-943mn-vitro labeled red cells. COMPARISON:  06/22/2016 CT of the abdomen and pelvis. FINDINGS: On dynamic imaging there is progressive radiotracer accumulation within the bladder. Inferiorly at the level of the pelvic brim there is a second focus of radiotracer uptake with variable activity. No abnormal radiotracer uptake is seen passing through small or large bowel within the abdomen. IMPRESSION: Inferiorly at level of pelvic brim there is a focus of radiotracer uptake with variable activity likely representing active bleeding within the rectum. No abnormal radiotracer uptake is seen passing through small or large bowel to suggest a more proximal bleed. Electronically Signed   By: LaKristine Garbe.D.   On: 06/26/2016 04:42   Ir Angiogram Visceral Selective  Result Date: 06/26/2016 INDICATION: Acute lower GI bleeding requiring transfusions EXAM: ULTRASOUND GUIDANCE FOR VASCULAR ACCESS CELIAC, SMA, AND IMA CATHETERIZATIONS AND ANGIOGRAMS MEDICATIONS: 1% lidocaine locally. ANESTHESIA/SEDATION: Moderate (conscious) sedation was employed during this procedure. A total of Versed 1.0 mg and Fentanyl 25 mcg was administered intravenously. Moderate Sedation Time: 35 minutes. The  patient's level of consciousness and vital signs were monitored continuously by radiology nursing throughout the procedure under my direct supervision. CONTRAST:  100 cc Isovue FLUOROSCOPY TIME:  Fluoroscopy Time: 6 minutes 24 seconds (460 mGy). COMPLICATIONS: None immediate. PROCEDURE: Informed consent was obtained from the patient following explanation of the procedure, risks, benefits and alternatives. The patient  understands, agrees and consents for the procedure. All questions were addressed. A time out was performed prior to the initiation of the procedure. Maximal barrier sterile technique utilized including caps, mask, sterile gowns, sterile gloves, large sterile drape, hand hygiene, and Betadine prep. Under sterile conditions and local anesthesia, ultrasound micropuncture access performed of the right common femoral artery. 5 French sheath inserted over a Bentson guidewire. Initially, a C2 catheter was utilized to select the SMA. SMA angiogram performed. SMA is widely patent. Jejunal branches throughout the mesentery are patent. No evidence of active bleeding. Replaced right hepatic artery noted off of the proximal SMA, a normal variant. On the venous phase, the superior mesenteric vein and portal vein are patent. Catheter was exchanged for a Mickelson catheter. This was reformed in the aorta and utilized to select the celiac origin. Celiac angiogram performed. Celiac origin is widely patent. Mild atherosclerotic change. Splenic, left gastric, and left hepatic vasculature are all patent. The gastroduodenal artery and gastroepiploic artery are visualized and patent. The catheter was retracted and utilized to select the IMA. Selective IMA angiogram performed. IMA is mildly atherosclerotic but patent. The superior hemorrhoidal and left colic branches are all patent. No evidence of active bleeding. On the venous phase, the IMV is patent. Access removed. Hemostasis obtained with the Exoseal device. No immediate  complication. Patient tolerated the procedure well. IMPRESSION: Successful mesenteric angiograms of the celiac, SMA and IMA without evidence of active GI bleeding. Electronically Signed   By: Jerilynn Mages.  Shick M.D.   On: 06/26/2016 09:54   Ir Angiogram Visceral Selective  Result Date: 06/26/2016 INDICATION: Acute lower GI bleeding requiring transfusions EXAM: ULTRASOUND GUIDANCE FOR VASCULAR ACCESS CELIAC, SMA, AND IMA CATHETERIZATIONS AND ANGIOGRAMS MEDICATIONS: 1% lidocaine locally. ANESTHESIA/SEDATION: Moderate (conscious) sedation was employed during this procedure. A total of Versed 1.0 mg and Fentanyl 25 mcg was administered intravenously. Moderate Sedation Time: 35 minutes. The patient's level of consciousness and vital signs were monitored continuously by radiology nursing throughout the procedure under my direct supervision. CONTRAST:  100 cc Isovue FLUOROSCOPY TIME:  Fluoroscopy Time: 6 minutes 24 seconds (460 mGy). COMPLICATIONS: None immediate. PROCEDURE: Informed consent was obtained from the patient following explanation of the procedure, risks, benefits and alternatives. The patient understands, agrees and consents for the procedure. All questions were addressed. A time out was performed prior to the initiation of the procedure. Maximal barrier sterile technique utilized including caps, mask, sterile gowns, sterile gloves, large sterile drape, hand hygiene, and Betadine prep. Under sterile conditions and local anesthesia, ultrasound micropuncture access performed of the right common femoral artery. 5 French sheath inserted over a Bentson guidewire. Initially, a C2 catheter was utilized to select the SMA. SMA angiogram performed. SMA is widely patent. Jejunal branches throughout the mesentery are patent. No evidence of active bleeding. Replaced right hepatic artery noted off of the proximal SMA, a normal variant. On the venous phase, the superior mesenteric vein and portal vein are patent. Catheter was  exchanged for a Mickelson catheter. This was reformed in the aorta and utilized to select the celiac origin. Celiac angiogram performed. Celiac origin is widely patent. Mild atherosclerotic change. Splenic, left gastric, and left hepatic vasculature are all patent. The gastroduodenal artery and gastroepiploic artery are visualized and patent. The catheter was retracted and utilized to select the IMA. Selective IMA angiogram performed. IMA is mildly atherosclerotic but patent. The superior hemorrhoidal and left colic branches are all patent. No evidence of active bleeding. On the venous phase, the IMV is patent.  Access removed. Hemostasis obtained with the Exoseal device. No immediate complication. Patient tolerated the procedure well. IMPRESSION: Successful mesenteric angiograms of the celiac, SMA and IMA without evidence of active GI bleeding. Electronically Signed   By: Judie Petit.  Shick M.D.   On: 06/26/2016 09:54   Ir Angiogram Visceral Selective  Result Date: 06/26/2016 INDICATION: Acute lower GI bleeding requiring transfusions EXAM: ULTRASOUND GUIDANCE FOR VASCULAR ACCESS CELIAC, SMA, AND IMA CATHETERIZATIONS AND ANGIOGRAMS MEDICATIONS: 1% lidocaine locally. ANESTHESIA/SEDATION: Moderate (conscious) sedation was employed during this procedure. A total of Versed 1.0 mg and Fentanyl 25 mcg was administered intravenously. Moderate Sedation Time: 35 minutes. The patient's level of consciousness and vital signs were monitored continuously by radiology nursing throughout the procedure under my direct supervision. CONTRAST:  100 cc Isovue FLUOROSCOPY TIME:  Fluoroscopy Time: 6 minutes 24 seconds (460 mGy). COMPLICATIONS: None immediate. PROCEDURE: Informed consent was obtained from the patient following explanation of the procedure, risks, benefits and alternatives. The patient understands, agrees and consents for the procedure. All questions were addressed. A time out was performed prior to the initiation of the  procedure. Maximal barrier sterile technique utilized including caps, mask, sterile gowns, sterile gloves, large sterile drape, hand hygiene, and Betadine prep. Under sterile conditions and local anesthesia, ultrasound micropuncture access performed of the right common femoral artery. 5 French sheath inserted over a Bentson guidewire. Initially, a C2 catheter was utilized to select the SMA. SMA angiogram performed. SMA is widely patent. Jejunal branches throughout the mesentery are patent. No evidence of active bleeding. Replaced right hepatic artery noted off of the proximal SMA, a normal variant. On the venous phase, the superior mesenteric vein and portal vein are patent. Catheter was exchanged for a Mickelson catheter. This was reformed in the aorta and utilized to select the celiac origin. Celiac angiogram performed. Celiac origin is widely patent. Mild atherosclerotic change. Splenic, left gastric, and left hepatic vasculature are all patent. The gastroduodenal artery and gastroepiploic artery are visualized and patent. The catheter was retracted and utilized to select the IMA. Selective IMA angiogram performed. IMA is mildly atherosclerotic but patent. The superior hemorrhoidal and left colic branches are all patent. No evidence of active bleeding. On the venous phase, the IMV is patent. Access removed. Hemostasis obtained with the Exoseal device. No immediate complication. Patient tolerated the procedure well. IMPRESSION: Successful mesenteric angiograms of the celiac, SMA and IMA without evidence of active GI bleeding. Electronically Signed   By: Judie Petit.  Shick M.D.   On: 06/26/2016 09:54   Ir US Guide Vasc Access Right  Result Date: 06/26/2016 INDICATION: Acute lower GI bleeding requiring transfusions EXAM: ULTRASOUND GUIDANCE FOR VASCULAR ACCESS CELIAC, SMA, AND IMA CATHETERIZATIONS AND ANGIOGRAMS MEDICATIONS: 1% lidocaine locally. ANESTHESIA/SEDATION: Moderate (conscious) sedation was employed during this  procedure. A total of Versed 1.0 mg and Fentanyl 25 mcg was administered intravenously. Moderate Sedation Time: 35 minutes. The patient's level of consciousness and vital signs were monitored continuously by radiology nursing throughout the procedure under my direct supervision. CONTRAST:  100 cc Isovue FLUOROSCOPY TIME:  Fluoroscopy Time: 6 minutes 24 seconds (460 mGy). COMPLICATIONS: None immediate. PROCEDURE: Informed consent was obtained from the patient following explanation of the procedure, risks, benefits and alternatives. The patient understands, agrees and consents for the procedure. All questions were addressed. A time out was performed prior to the initiation of the procedure. Maximal barrier sterile technique utilized including caps, mask, sterile gowns, sterile gloves, large sterile drape, hand hygiene, and Betadine prep. Under sterile conditions and local  anesthesia, ultrasound micropuncture access performed of the right common femoral artery. 5 French sheath inserted over a Bentson guidewire. Initially, a C2 catheter was utilized to select the SMA. SMA angiogram performed. SMA is widely patent. Jejunal branches throughout the mesentery are patent. No evidence of active bleeding. Replaced right hepatic artery noted off of the proximal SMA, a normal variant. On the venous phase, the superior mesenteric vein and portal vein are patent. Catheter was exchanged for a Mickelson catheter. This was reformed in the aorta and utilized to select the celiac origin. Celiac angiogram performed. Celiac origin is widely patent. Mild atherosclerotic change. Splenic, left gastric, and left hepatic vasculature are all patent. The gastroduodenal artery and gastroepiploic artery are visualized and patent. The catheter was retracted and utilized to select the IMA. Selective IMA angiogram performed. IMA is mildly atherosclerotic but patent. The superior hemorrhoidal and left colic branches are all patent. No evidence of  active bleeding. On the venous phase, the IMV is patent. Access removed. Hemostasis obtained with the Exoseal device. No immediate complication. Patient tolerated the procedure well. IMPRESSION: Successful mesenteric angiograms of the celiac, SMA and IMA without evidence of active GI bleeding. Electronically Signed   By: Jerilynn Mages.  Shick M.D.   On: 06/26/2016 09:54   IMPRESSION:  *LGIB:  Likely diverticular.  Had colonoscopy 4/2 at [facility] with blood in right colon.  Patient transferred here for IR.  Bleeding scan positive for pelvic brim/rectum.  IR angio unable to identify source.  Patient re-bled early this AM.  CT angio results pending. *ABLA:  I believe that he has been given 11 units PRBC's and some FFP as well.  PLAN: *Plan for colonoscopy on 4/6 with Dr. Silverio Decamp as another attempt to try to identify the bleeding site.  Yield is low but understandable want to exhaust all of our options before surgical intervention. *Monitor Hgb and transfuse further prn.   ZEHR, JESSICA D.  06/27/2016, 1:13 PM  Pager number [phone]   Attending physician's note   I have taken a history, examined the patient and reviewed the chart. I agree with the Advanced Practitioner's note, impression and recommendations. Recurrent hematochezia, colonoscopy 06/24/2016 with findings of blood in the right colon. Unable to localize the site of bleeding on nuclear med RBC scan. CT and CT angio negative as it was done when patient was not actively bleeding. No further episodes since last night. Tentatively plan for repeat colonoscopy tomorrow to evaluate and try to localize the site of bleeding  K Denzil Magnuson, MD [phone] Mon-Fri 8a-5p [phone] after 5p, weekends, holidays

## 2016-06-28 NOTE — Transfer of Care (Signed)
Immediate Anesthesia Transfer of Care Note  Patient: Sean Leonard  Procedure(s) Performed: Procedure(s): COLONOSCOPY WITH PROPOFOL (N/A)  Patient Location: Endoscopy Unit  Anesthesia Type:MAC  Level of Consciousness: awake, alert , oriented and patient cooperative  Airway & Oxygen Therapy: Patient Spontanous Breathing and Patient connected to nasal cannula oxygen  Post-op Assessment: Report given to RN and Post -op Vital signs reviewed and stable  Post vital signs: Reviewed and stable  Last Vitals:  Vitals:   06/28/16 0933 06/28/16 1113  BP: 119/67 104/63  Pulse: 91 93  Resp: 20 (!) 24  Temp: 37.2 C 36.9 C    Last Pain:  Vitals:   06/28/16 1113  TempSrc: Oral  PainSc:       Patients Stated Pain Goal: 0 (47/15/95 [id number])  Complications: No apparent anesthesia complications

## 2016-06-28 NOTE — Anesthesia Postprocedure Evaluation (Signed)
Anesthesia Post Note  Patient: Sean Leonard  Procedure(s) Performed: Procedure(s) (LRB): COLONOSCOPY WITH PROPOFOL (N/A)  Patient location during evaluation: Endoscopy Anesthesia Type: MAC Level of consciousness: awake Pain management: pain level controlled Vital Signs Assessment: post-procedure vital signs reviewed and stable Respiratory status: spontaneous breathing Cardiovascular status: stable Postop Assessment: no signs of nausea or vomiting Anesthetic complications: no       Last Vitals:  Vitals:   06/28/16 1113 06/28/16 1123  BP: 104/63 111/66  Pulse: 93 90  Resp: (!) 24 (!) 23  Temp: 36.9 C     Last Pain:  Vitals:   06/28/16 1113  TempSrc: Oral  PainSc:                  Jolly Mango

## 2016-06-28 NOTE — Progress Notes (Signed)
qPhysical Therapy Treatment Patient Details Name: Sean Leonard MRN: [id number] DOB: Apr 16, 1945 Today's Date: 06/28/2016    History of Present Illness Pt is a 71 y.o. male with hx of known diverticulosis with colonoscopy by Dr Felipe Drone 3 years ago, hx of Barretts' s/p EGD same time, DDD, hypothyrodism, bilateral iliac artery aneurysm, presented to the ER with BRBPR    PT Comments    Pt making progress with ambulation distance; however, required min A for stability as pt more unsteady on his feet this session (likely secondary to having anesthesia earlier today for colonoscopy procedure). All VSS throughout. PT will continue to follow acutely to ensure a safe d/c home.   Follow Up Recommendations  No PT follow up;Supervision - Intermittent     Equipment Recommendations  None recommended by PT    Recommendations for Other Services       Precautions / Restrictions Precautions Precautions: Fall Restrictions Weight Bearing Restrictions: No    Mobility  Bed Mobility Overal bed mobility: Needs Assistance Bed Mobility: Sit to Supine       Sit to supine: Min guard   General bed mobility comments: increased time, min guard for safety  Transfers Overall transfer level: Needs assistance Equipment used: None Transfers: Sit to/from Stand Sit to Stand: Min guard         General transfer comment: min guard for safety and stability, no physical assist required  Ambulation/Gait Ambulation/Gait assistance: Min assist Ambulation Distance (Feet): 100 Feet Assistive device: None Gait Pattern/deviations: Step-through pattern;Drifts right/left Gait velocity: decreased Gait velocity interpretation: Below normal speed for age/gender General Gait Details: pt required min A secondary to instability, pt with LOB x2 that required min A to maintain upright standing position (likely secondary to anesthesia from colonoscopy procedure earlier)   Stairs            Wheelchair  Mobility    Modified Rankin (Stroke Patients Only)       Balance Overall balance assessment: Needs assistance Sitting-balance support: Feet supported Sitting balance-Leahy Scale: Good     Standing balance support: During functional activity;No upper extremity supported Standing balance-Leahy Scale: Fair Standing balance comment: static standing balance fair with min guard, min A for dynamic                            Cognition Arousal/Alertness: Awake/alert Behavior During Therapy: WFL for tasks assessed/performed Overall Cognitive Status: Within Functional Limits for tasks assessed                                        Exercises      General Comments        Pertinent Vitals/Pain Pain Assessment: No/denies pain    Home Living                      Prior Function            PT Goals (current goals can now be found in the care plan section) Acute Rehab PT Goals PT Goal Formulation: With patient/family Time For Goal Achievement: 07/10/16 Potential to Achieve Goals: Good Progress towards PT goals: Progressing toward goals    Frequency    Min 3X/week      PT Plan Current plan remains appropriate    Co-evaluation             End  of Session Equipment Utilized During Treatment: Gait belt Activity Tolerance: Patient tolerated treatment well Patient left: in bed;with call bell/phone within reach;with family/visitor present Nurse Communication: Mobility status PT Visit Diagnosis: Difficulty in walking, not elsewhere classified (R26.2)     Time: 3646-8032 PT Time Calculation (min) (ACUTE ONLY): 16 min  Charges:  $Gait Training: 8-22 mins                    G Codes:       Sean Leonard, Sean Leonard, [place] 06/28/2016, 3:25 PM

## 2016-06-28 NOTE — Progress Notes (Signed)
Patient ID: Sean Leonard, male   DOB: March 07, 1946, 71 y.o.   MRN: [id number]  [facility] Surgery Progress Note  4 Days Post-Op  Subjective: CC- lower GI bleed States that he completed the bowel prep yesterday and is scheduled for a colonoscopy today. He denies any abdominal pain. States that he had a bloody BM yesterday and this morning. States that it is dark black/dark red with clots. Denies lightheadedness or dizziness. Denies CP or SOB.  He received 2 uPRBC yesterday morning, but that is the most recent transfusion.  Objective: Vital signs in last 24 hours: Temp:  [98.4 F (36.9 C)-99.4 F (37.4 C)] 98.4 F (36.9 C) (04/06 0700) Pulse Rate:  [86-98] 87 (04/06 0700) Resp:  [13-23] 15 (04/06 0700) BP: (94-135)/(55-75) 94/55 (04/06 0700) SpO2:  [97 %-99 %] 98 % (04/06 0700) Last BM Date: 06/27/16  Intake/Output from previous day: 04/05 0701 - 04/06 0700 In: 1430 [P.O.:600; I.V.:800; Blood:30] Out: 750 [Urine:750] Intake/Output this shift: No intake/output data recorded.  PE: Gen:  Alert, NAD, pleasant Card:  RRR, no M/G/R heard Pulm:  CTAB, no W/R/R, effort normal Abd: no previous incisions seen, soft, NT/ND, +BS, no masses, hernias, or organomegaly Ext:  No erythema, edema, or tenderness BUE/BLE  Lab Results:   Recent Labs  06/27/16 1443 06/27/16 2029  WBC 14.2* 14.1*  HGB 7.6* 7.1*  HCT 22.1* 20.4*  PLT 109* 119*   BMET  Recent Labs  06/27/16 1153 06/28/16 0306  NA 137  136 134*  K 3.6  3.5 3.5  CL 112*  111 105  CO2 21*  21* 24  GLUCOSE 86  87 104*  BUN 10  9 8   CREATININE 0.71  0.69 0.72  CALCIUM 7.0*  6.9* 7.3*   PT/INR No results for input(s): LABPROT, INR in the last 72 hours. CMP     Component Value Date/Time   NA 134 (L) 06/28/2016 0306   K 3.5 06/28/2016 0306   CL 105 06/28/2016 0306   CO2 24 06/28/2016 0306   GLUCOSE 104 (H) 06/28/2016 0306   BUN 8 06/28/2016 0306   CREATININE 0.72 06/28/2016 0306   CALCIUM 7.3 (L)  06/28/2016 0306   PROT 3.4 (L) 06/27/2016 1153   ALBUMIN 1.7 (L) 06/27/2016 1153   ALBUMIN 4.2 05/15/2012   AST 16 06/27/2016 1153   AST 15 05/15/2012   ALT 10 (L) 06/27/2016 1153   ALKPHOS 27 (L) 06/27/2016 1153   BILITOT 0.6 06/27/2016 1153   BILITOT 0.4 05/15/2012   GFRNONAA >60 06/28/2016 0306   GFRAA >60 06/28/2016 0306   Lipase     Component Value Date/Time   LIPASE 33 06/22/2016 2050       Studies/Results: Ir Angiogram Visceral Selective  Result Date: 06/26/2016 INDICATION: Acute lower GI bleeding requiring transfusions EXAM: ULTRASOUND GUIDANCE FOR VASCULAR ACCESS CELIAC, SMA, AND IMA CATHETERIZATIONS AND ANGIOGRAMS MEDICATIONS: 1% lidocaine locally. ANESTHESIA/SEDATION: Moderate (conscious) sedation was employed during this procedure. A total of Versed 1.0 mg and Fentanyl 25 mcg was administered intravenously. Moderate Sedation Time: 35 minutes. The patient's level of consciousness and vital signs were monitored continuously by radiology nursing throughout the procedure under my direct supervision. CONTRAST:  100 cc Isovue FLUOROSCOPY TIME:  Fluoroscopy Time: 6 minutes 24 seconds (460 mGy). COMPLICATIONS: None immediate. PROCEDURE: Informed consent was obtained from the patient following explanation of the procedure, risks, benefits and alternatives. The patient understands, agrees and consents for the procedure. All questions were addressed. A time out was performed prior  to the initiation of the procedure. Maximal barrier sterile technique utilized including caps, mask, sterile gowns, sterile gloves, large sterile drape, hand hygiene, and Betadine prep. Under sterile conditions and local anesthesia, ultrasound micropuncture access performed of the right common femoral artery. 5 French sheath inserted over a Bentson guidewire. Initially, a C2 catheter was utilized to select the SMA. SMA angiogram performed. SMA is widely patent. Jejunal branches throughout the mesentery are patent.  No evidence of active bleeding. Replaced right hepatic artery noted off of the proximal SMA, a normal variant. On the venous phase, the superior mesenteric vein and portal vein are patent. Catheter was exchanged for a Mickelson catheter. This was reformed in the aorta and utilized to select the celiac origin. Celiac angiogram performed. Celiac origin is widely patent. Mild atherosclerotic change. Splenic, left gastric, and left hepatic vasculature are all patent. The gastroduodenal artery and gastroepiploic artery are visualized and patent. The catheter was retracted and utilized to select the IMA. Selective IMA angiogram performed. IMA is mildly atherosclerotic but patent. The superior hemorrhoidal and left colic branches are all patent. No evidence of active bleeding. On the venous phase, the IMV is patent. Access removed. Hemostasis obtained with the Exoseal device. No immediate complication. Patient tolerated the procedure well. IMPRESSION: Successful mesenteric angiograms of the celiac, SMA and IMA without evidence of active GI bleeding. Electronically Signed   By: Jerilynn Mages.  Shick M.D.   On: 06/26/2016 09:54   Ir Angiogram Visceral Selective  Result Date: 06/26/2016 INDICATION: Acute lower GI bleeding requiring transfusions EXAM: ULTRASOUND GUIDANCE FOR VASCULAR ACCESS CELIAC, SMA, AND IMA CATHETERIZATIONS AND ANGIOGRAMS MEDICATIONS: 1% lidocaine locally. ANESTHESIA/SEDATION: Moderate (conscious) sedation was employed during this procedure. A total of Versed 1.0 mg and Fentanyl 25 mcg was administered intravenously. Moderate Sedation Time: 35 minutes. The patient's level of consciousness and vital signs were monitored continuously by radiology nursing throughout the procedure under my direct supervision. CONTRAST:  100 cc Isovue FLUOROSCOPY TIME:  Fluoroscopy Time: 6 minutes 24 seconds (460 mGy). COMPLICATIONS: None immediate. PROCEDURE: Informed consent was obtained from the patient following explanation of the  procedure, risks, benefits and alternatives. The patient understands, agrees and consents for the procedure. All questions were addressed. A time out was performed prior to the initiation of the procedure. Maximal barrier sterile technique utilized including caps, mask, sterile gowns, sterile gloves, large sterile drape, hand hygiene, and Betadine prep. Under sterile conditions and local anesthesia, ultrasound micropuncture access performed of the right common femoral artery. 5 French sheath inserted over a Bentson guidewire. Initially, a C2 catheter was utilized to select the SMA. SMA angiogram performed. SMA is widely patent. Jejunal branches throughout the mesentery are patent. No evidence of active bleeding. Replaced right hepatic artery noted off of the proximal SMA, a normal variant. On the venous phase, the superior mesenteric vein and portal vein are patent. Catheter was exchanged for a Mickelson catheter. This was reformed in the aorta and utilized to select the celiac origin. Celiac angiogram performed. Celiac origin is widely patent. Mild atherosclerotic change. Splenic, left gastric, and left hepatic vasculature are all patent. The gastroduodenal artery and gastroepiploic artery are visualized and patent. The catheter was retracted and utilized to select the IMA. Selective IMA angiogram performed. IMA is mildly atherosclerotic but patent. The superior hemorrhoidal and left colic branches are all patent. No evidence of active bleeding. On the venous phase, the IMV is patent. Access removed. Hemostasis obtained with the Exoseal device. No immediate complication. Patient tolerated the procedure well. IMPRESSION:  Successful mesenteric angiograms of the celiac, SMA and IMA without evidence of active GI bleeding. Electronically Signed   By: Jerilynn Mages.  Shick M.D.   On: 06/26/2016 09:54   Ir Angiogram Visceral Selective  Result Date: 06/26/2016 INDICATION: Acute lower GI bleeding requiring transfusions EXAM:  ULTRASOUND GUIDANCE FOR VASCULAR ACCESS CELIAC, SMA, AND IMA CATHETERIZATIONS AND ANGIOGRAMS MEDICATIONS: 1% lidocaine locally. ANESTHESIA/SEDATION: Moderate (conscious) sedation was employed during this procedure. A total of Versed 1.0 mg and Fentanyl 25 mcg was administered intravenously. Moderate Sedation Time: 35 minutes. The patient's level of consciousness and vital signs were monitored continuously by radiology nursing throughout the procedure under my direct supervision. CONTRAST:  100 cc Isovue FLUOROSCOPY TIME:  Fluoroscopy Time: 6 minutes 24 seconds (460 mGy). COMPLICATIONS: None immediate. PROCEDURE: Informed consent was obtained from the patient following explanation of the procedure, risks, benefits and alternatives. The patient understands, agrees and consents for the procedure. All questions were addressed. A time out was performed prior to the initiation of the procedure. Maximal barrier sterile technique utilized including caps, mask, sterile gowns, sterile gloves, large sterile drape, hand hygiene, and Betadine prep. Under sterile conditions and local anesthesia, ultrasound micropuncture access performed of the right common femoral artery. 5 French sheath inserted over a Bentson guidewire. Initially, a C2 catheter was utilized to select the SMA. SMA angiogram performed. SMA is widely patent. Jejunal branches throughout the mesentery are patent. No evidence of active bleeding. Replaced right hepatic artery noted off of the proximal SMA, a normal variant. On the venous phase, the superior mesenteric vein and portal vein are patent. Catheter was exchanged for a Mickelson catheter. This was reformed in the aorta and utilized to select the celiac origin. Celiac angiogram performed. Celiac origin is widely patent. Mild atherosclerotic change. Splenic, left gastric, and left hepatic vasculature are all patent. The gastroduodenal artery and gastroepiploic artery are visualized and patent. The catheter was  retracted and utilized to select the IMA. Selective IMA angiogram performed. IMA is mildly atherosclerotic but patent. The superior hemorrhoidal and left colic branches are all patent. No evidence of active bleeding. On the venous phase, the IMV is patent. Access removed. Hemostasis obtained with the Exoseal device. No immediate complication. Patient tolerated the procedure well. IMPRESSION: Successful mesenteric angiograms of the celiac, SMA and IMA without evidence of active GI bleeding. Electronically Signed   By: Jerilynn Mages.  Shick M.D.   On: 06/26/2016 09:54   Ir US Guide Vasc Access Right  Result Date: 06/26/2016 INDICATION: Acute lower GI bleeding requiring transfusions EXAM: ULTRASOUND GUIDANCE FOR VASCULAR ACCESS CELIAC, SMA, AND IMA CATHETERIZATIONS AND ANGIOGRAMS MEDICATIONS: 1% lidocaine locally. ANESTHESIA/SEDATION: Moderate (conscious) sedation was employed during this procedure. A total of Versed 1.0 mg and Fentanyl 25 mcg was administered intravenously. Moderate Sedation Time: 35 minutes. The patient's level of consciousness and vital signs were monitored continuously by radiology nursing throughout the procedure under my direct supervision. CONTRAST:  100 cc Isovue FLUOROSCOPY TIME:  Fluoroscopy Time: 6 minutes 24 seconds (460 mGy). COMPLICATIONS: None immediate. PROCEDURE: Informed consent was obtained from the patient following explanation of the procedure, risks, benefits and alternatives. The patient understands, agrees and consents for the procedure. All questions were addressed. A time out was performed prior to the initiation of the procedure. Maximal barrier sterile technique utilized including caps, mask, sterile gowns, sterile gloves, large sterile drape, hand hygiene, and Betadine prep. Under sterile conditions and local anesthesia, ultrasound micropuncture access performed of the right common femoral artery. 5 French sheath inserted over a  Bentson guidewire. Initially, a C2 catheter was  utilized to select the SMA. SMA angiogram performed. SMA is widely patent. Jejunal branches throughout the mesentery are patent. No evidence of active bleeding. Replaced right hepatic artery noted off of the proximal SMA, a normal variant. On the venous phase, the superior mesenteric vein and portal vein are patent. Catheter was exchanged for a Mickelson catheter. This was reformed in the aorta and utilized to select the celiac origin. Celiac angiogram performed. Celiac origin is widely patent. Mild atherosclerotic change. Splenic, left gastric, and left hepatic vasculature are all patent. The gastroduodenal artery and gastroepiploic artery are visualized and patent. The catheter was retracted and utilized to select the IMA. Selective IMA angiogram performed. IMA is mildly atherosclerotic but patent. The superior hemorrhoidal and left colic branches are all patent. No evidence of active bleeding. On the venous phase, the IMV is patent. Access removed. Hemostasis obtained with the Exoseal device. No immediate complication. Patient tolerated the procedure well. IMPRESSION: Successful mesenteric angiograms of the celiac, SMA and IMA without evidence of active GI bleeding. Electronically Signed   By: Jerilynn Mages.  Shick M.D.   On: 06/26/2016 09:54   Ct Angio Abd/pel W/ And/or W/o  Result Date: 06/27/2016 CLINICAL DATA:  Lower abdominal pain, acute lower GI bleeding EXAM: CT ANGIOGRAPHY ABDOMEN AND PELVIS TECHNIQUE: Multidetector CT imaging of the abdomen and pelvis was performed using the standard protocol during bolus administration of intravenous contrast. Multiplanar reconstructed images including MIPs were obtained and reviewed to evaluate the vascular anatomy. CONTRAST:  100 cc Isovue 350 COMPARISON:  06/22/2016, 06/26/2016 FINDINGS: Arterial findings: Aorta: Mild tortuosity and atherosclerotic changes of the abdominal aorta. Infrarenal aorta maximal AP diameter 2.7 cm and transverse 2.5 cm. No occlusive process, acute  dissection, or retroperitoneal hemorrhage. Celiac axis: Widely patent origin and branches. No acute vascular finding or bleeding. Superior mesenteric: Widely patent origin and branches. Replaced right hepatic artery noted. No active contrast extravasation or bleeding demonstrated. Left renal:           Widely patent Right renal:          Atherosclerotic changes but widely patent Inferior mesenteric: Atherosclerotic origin but remains patent. No active GI bleeding within the IMA vascular distribution. Left iliac: Atherosclerotic changes and mild ectasia of the left common iliac artery. Left common iliac diameter is 14 mm. 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 Left common, internal and external iliac arteries remain patent. No occlusive process. Right iliac: Atherosclerotic changes and aneurysmal dilatation of the right common iliac artery, maximal diameter 2.2 cm, image 88. Aneurysm dilatation also noted of the right internal iliac artery measuring 16 mm, image 100. Right iliac vessels remain patent without occlusive process. Visualized common femoral, proximal profunda femoral, and proximal superficial femoral arteries are also patent. Venous findings: Hepatic, portal, splenic, and superior mesenteric veins are all patent. IVC and renal veins are patent. Iliac and proximal femoral veins appear patent. No acute veno-occlusive process. Review of the MIP images confirms the above findings. Nonvascular findings: Minimal dependent bibasilar atelectasis and trace pleural effusions. Normal heart size. No pericardial effusion. Liver, gallbladder, biliary system demonstrate no focal abnormality. No biliary duct dilatation. The pancreas demonstrates no focal abnormality, ductal dilatation or surrounding inflammatory process. Spleen is within normal limits. Adrenal glands demonstrate no acute abnormality. Kidneys demonstrate symmetric enhancement and excretion. No  renal obstruction or hydronephrosis. Small hypodense cortical cysts in the right kidney mid and lower pole regions  measuring 12 mm or less in size. Negative for bowel obstruction, significant dilatation, or free air. No fluid collection or abscess. Negative for ascites. Again, no evidence of active contrast extravasation within the bowel. Prostate gland is enlarged. Seminal vesicles are symmetric. Urinary bladder unremarkable. No inguinal abnormality or hernia.  Intact abdominal wall. Degenerative changes noted of the spine diffusely but most pronounced from L2-S1. IMPRESSION: Negative for significant active GI bleeding by CTA. Mesenteric vessels remain patent. Aortoiliac atherosclerosis with mild infrarenal aortic ectasia and small iliac aneurysms noted. No vascular occlusive process. No other acute intra-abdominal or pelvic finding. Trace bibasilar atelectasis and pleural effusions. Electronically Signed   By: Jerilynn Mages.  Shick M.D.   On: 06/27/2016 14:49    Anti-infectives: Anti-infectives    None       Assessment/Plan Lower GI bleed - known h/o colonic diverticulosis per colonoscopy 12/2013, but no prior h/o GI bleed or diverticulitis - no previous abdominal surgical history - BRBPR began 3/31 - CT scan 3/31 showed mildly prominent vasculature extending along the rectal wall, raising concern for internal hemorrhoids; mild diverticulosis along the ascending colon, without diverticulitis - colonoscopy at [facility] 4/2: report says bleeding noted in terminal ileum, rectal bleeding most likely due to ascending colon diverticulosis - Red tag cell study 4/4 showed possible bleeding at the pelvic brim/rectum - 4/4 mesenteric angiograms of the celiac, SMA and IMA without evidence of active GI bleeding - 4/5 negative CTA for significant active GI bleeding, mesenteric vessels remain patent - Hg 7.1 yesterday evening, CBC pending this AM  Diverticulosis Barrett's esophagus DDD Hypothyroidism Bilateral  iliac artery aneurysm   ID - none VTE - SCDs FEN - IVF, NPO  Plan - Will await colonoscopy results. Only if patient continues to bleed would we consider surgical intervention; hopefully we will be able to localize the site of bleeding, if not and if he requires surgery he would need a subtotal colectomy/ileostomy. Continue transfusions PRN per primary team.   LOS: 5 days    Jerrye Beavers , [facility] Surgery 06/28/2016, 7:37 AM Pager: [phone] Consults: [phone] Mon-Fri 7:00 am-4:30 pm Sat-Sun 7:00 am-11:30 am

## 2016-06-28 NOTE — Anesthesia Preprocedure Evaluation (Addendum)
Anesthesia Evaluation    Airway Mallampati: I  TM Distance: >3 FB Neck ROM: Full    Dental  (+) Poor Dentition   Pulmonary former smoker,    breath sounds clear to auscultation       Cardiovascular + Peripheral Vascular Disease   Rhythm:Regular Rate:Normal     Neuro/Psych    GI/Hepatic PUD,   Endo/Other    Renal/GU      Musculoskeletal  (+) Arthritis ,   Abdominal   Peds  Hematology  (+) anemia ,   Anesthesia Other Findings   Reproductive/Obstetrics                            Anesthesia Physical Anesthesia Plan  ASA: III  Anesthesia Plan: MAC and General   Post-op Pain Management:    Induction: Intravenous  Airway Management Planned: Simple Face Mask  Additional Equipment:   Intra-op Plan:   Post-operative Plan:   Informed Consent: I have reviewed the patients History and Physical, chart, labs and discussed the procedure including the risks, benefits and alternatives for the proposed anesthesia with the patient or authorized representative who has indicated his/her understanding and acceptance.     Plan Discussed with: CRNA  Anesthesia Plan Comments:         Anesthesia Quick Evaluation

## 2016-06-28 NOTE — Interval H&P Note (Signed)
History and Physical Interval Note:  06/28/2016 9:40 AM  Sean Leonard  has presented today for surgery, with the diagnosis of LGIB  The various methods of treatment have been discussed with the patient and family. After consideration of risks, benefits and other options for treatment, the patient has consented to  Procedure(s): COLONOSCOPY WITH PROPOFOL (N/A) as a surgical intervention .  The patient's history has been reviewed, patient examined, no change in status, stable for surgery.  I have reviewed the patient's chart and labs.  Questions were answered to the patient's satisfaction.     Sheril Hammond

## 2016-06-28 NOTE — Anesthesia Procedure Notes (Signed)
Procedure Name: MAC Date/Time: 06/28/2016 10:29 AM Performed by: Everlean Cherry A Pre-anesthesia Checklist: Patient identified, Emergency Drugs available, Suction available and Patient being monitored Patient Re-evaluated:Patient Re-evaluated prior to inductionOxygen Delivery Method: Nasal cannula

## 2016-06-28 NOTE — Op Note (Addendum)
[facility] Patient Name: Sean Leonard Procedure Date : 06/28/2016 MRN: [id number] Attending MD: Mauri Pole , MD Date of Birth: May 04, 1945 CSN: [id number] Age: 71 Admit Type: Inpatient Procedure:                Colonoscopy Indications:              Evaluation of unexplained GI bleeding Providers:                Mauri Pole, MD, Dortha Schwalbe RN, RN,                            Cletis Athens, Technician Referring MD:              Medicines:                Monitored Anesthesia Care Complications:            No immediate complications. Estimated Blood Loss:     Estimated blood loss was minimal. Procedure:                Pre-Anesthesia Assessment:                           - Prior to the procedure, a History and Physical                            was performed, and patient medications and                            allergies were reviewed. The patient's tolerance of                            previous anesthesia was also reviewed. The risks                            and benefits of the procedure and the sedation                            options and risks were discussed with the patient.                            All questions were answered, and informed consent                            was obtained. Prior Anticoagulants: The patient has                            taken no previous anticoagulant or antiplatelet                            agents. ASA Grade Assessment: III - A patient with                            severe systemic disease. After reviewing the risks  and benefits, the patient was deemed in                            satisfactory condition to undergo the procedure.                           After obtaining informed consent, the colonoscope                            was passed under direct vision. Throughout the                            procedure, the patient's blood pressure, pulse, and                             oxygen saturations were monitored continuously. The                            EC-[id number] ([id number]) scope was introduced through                            the anus and advanced to the the terminal ileum,                            with identification of the appendiceal orifice and                            IC valve. The colonoscopy was performed without                            difficulty. The patient tolerated the procedure                            well. The quality of the bowel preparation was                            excellent. The terminal ileum, ileocecal valve,                            appendiceal orifice, and rectum were photographed. Scope In: 10:34:55 AM Scope Out: 11:05:15 AM Scope Withdrawal Time: 0 hours 16 minutes 41 seconds  Total Procedure Duration: 0 hours 30 minutes 20 seconds  Findings:      The perianal and digital rectal examinations were normal.      Residual blood was found in the rectum, in the sigmoid colon, in the       transverse colon and in the ascending colon.      A few ulcers superficial to cratered ranging from 3-43mm in size were       found in the ascending colon, transverse colon and rectum. No bleeding       was present. Stigmata of recent bleeding were present with pigment spot       noted at one of the polypectomy site in ascending colon likely site of       polypectomy. For hemostasis, one hemostatic clip  was successfully placed       (MR conditional). There was no bleeding at the end of the procedure.      Non-bleeding internal hemorrhoids were found during retroflexion. The       hemorrhoids were small.      A few small-mouthed diverticula were found in the sigmoid colon and       ascending colon. There was no evidence of diverticular bleeding.      The terminal ileum appeared normal. Impression:               - Blood in the rectum, in the sigmoid colon, in the                            transverse colon and in the ascending colon.                            - A few ulcers in the ascending colon. Clip (MR                            conditional) was placed.                           - Non-bleeding internal hemorrhoids.                           - Mild diverticulosis in the sigmoid colon and in                            the ascending colon. There was no evidence of                            diverticular bleeding.                           - The examined portion of the ileum was normal.                           - No specimens collected.                           - No active bleeding noted Moderate Sedation:      N/A Recommendation:           - Patient has a contact number available for                            emergencies. The signs and symptoms of potential                            delayed complications were discussed with the                            patient. Return to normal activities tomorrow.                            Written discharge instructions were provided to the  patient.                           - Clear liquid diet.                           - Continue present medications.                           - Monitor Hgb q12h and transfuse as needed                           - If continues to have hematochezia will have to                            consider repeat NM RBC tag scan to localize the                            source                           - Repeat colonoscopy date to be determined after                            pending pathology results are reviewed for                            surveillance based on pathology results by Dr Oneida Alar Procedure Code(s):        --- Professional ---                           [phone], Colonoscopy, flexible; with control of                            bleeding, any method Diagnosis Code(s):        --- Professional ---                           K62.5, Hemorrhage of anus and rectum                           K92.2, Gastrointestinal  hemorrhage, unspecified                           K63.3, Ulcer of intestine                           K64.8, Other hemorrhoids                           K57.30, Diverticulosis of large intestine without                            perforation or abscess without bleeding CPT copyright 2016 American Medical Association. All rights reserved. The codes documented in this report are preliminary and upon coder review may  be revised to meet current compliance requirements. Analucia Hush V.  Keysi Oelkers, MD 06/28/2016 11:18:20 AM This report has been signed electronically. Number of Addenda: 0

## 2016-06-29 LAB — TYPE AND SCREEN
ABO/RH(D): AB POS
ANTIBODY SCREEN: NEGATIVE
UNIT DIVISION: 0
UNIT DIVISION: 0
UNIT DIVISION: 0
UNIT DIVISION: 0
UNIT DIVISION: 0
UNIT DIVISION: 0
Unit division: 0
Unit division: 0

## 2016-06-29 LAB — BPAM RBC
BLOOD PRODUCT EXPIRATION DATE: 201804262359
BLOOD PRODUCT EXPIRATION DATE: 201804262359
Blood Product Expiration Date: 201804202359
Blood Product Expiration Date: 201804202359
Blood Product Expiration Date: 201804222359
Blood Product Expiration Date: 201804222359
Blood Product Expiration Date: 201804232359
Blood Product Expiration Date: 201804242359
ISSUE DATE / TIME: 201804040253
ISSUE DATE / TIME: 201804061703
ISSUE DATE / TIME: 201804032302
ISSUE DATE / TIME: 201804040135
ISSUE DATE / TIME: 201804040427
ISSUE DATE / TIME: 201804050223
ISSUE DATE / TIME: 201804050739
UNIT TYPE AND RH: 6200
UNIT TYPE AND RH: 8400
Unit Type and Rh: 6200
Unit Type and Rh: 6200
Unit Type and Rh: 6200
Unit Type and Rh: 6200
Unit Type and Rh: 6200
Unit Type and Rh: 8400

## 2016-06-29 LAB — BASIC METABOLIC PANEL
ANION GAP: 6 (ref 5–15)
Anion gap: 7 (ref 5–15)
BUN: 5 mg/dL — ABNORMAL LOW (ref 6–20)
BUN: 7 mg/dL (ref 6–20)
CO2: 27 mmol/L (ref 22–32)
CO2: 27 mmol/L (ref 22–32)
CREATININE: 0.85 mg/dL (ref 0.61–1.24)
Calcium: 7.7 mg/dL — ABNORMAL LOW (ref 8.9–10.3)
Calcium: 7.8 mg/dL — ABNORMAL LOW (ref 8.9–10.3)
Chloride: 103 mmol/L (ref 101–111)
Chloride: 102 mmol/L (ref 101–111)
Creatinine, Ser: 0.83 mg/dL (ref 0.61–1.24)
GFR calc non Af Amer: >60 mL/min (ref >60)
GLUCOSE: 104 mg/dL — AB (ref 65–99)
Glucose, Bld: 139 mg/dL — ABNORMAL HIGH (ref 65–99)
POTASSIUM: 3.3 mmol/L — AB (ref 3.5–5.1)
Potassium: 3.2 mmol/L — ABNORMAL LOW (ref 3.5–5.1)
SODIUM: 136 mmol/L (ref 135–145)
SODIUM: 136 mmol/L (ref 135–145)

## 2016-06-29 LAB — CBC
HEMATOCRIT: 23.5 % — AB (ref 39.0–52.0)
HEMATOCRIT: 23.1 % — AB (ref 39.0–52.0)
HEMOGLOBIN: 7.8 g/dL — AB (ref 13.0–17.0)
HEMOGLOBIN: 7.8 g/dL — AB (ref 13.0–17.0)
MCH: 29.8 pg (ref 26.0–34.0)
MCH: 30.2 pg (ref 26.0–34.0)
MCHC: 33.2 g/dL (ref 30.0–36.0)
MCHC: 33.8 g/dL (ref 30.0–36.0)
MCV: 89.7 fL (ref 78.0–100.0)
MCV: 89.5 fL (ref 78.0–100.0)
Platelets: 178 10*3/uL (ref 150–400)
Platelets: 214 10*3/uL (ref 150–400)
RBC: 2.62 MIL/uL — AB (ref 4.22–5.81)
RBC: 2.58 MIL/uL — AB (ref 4.22–5.81)
RDW: 15.6 % — ABNORMAL HIGH (ref 11.5–15.5)
RDW: 15.3 % (ref 11.5–15.5)
WBC: 7.7 10*3/uL (ref 4.0–10.5)
WBC: 8.6 10*3/uL (ref 4.0–10.5)

## 2016-06-29 MED ORDER — SIMETHICONE 80 MG PO CHEW: 80.0000 mg | CHEWABLE_TABLET | Freq: Once | ORAL | Status: DC

## 2016-06-29 NOTE — Progress Notes (Addendum)
Sean Leonard is a 71 y.o. male patient is a transfer from [address] awake, alert - oriented  X 4 - no acute distress noted.  VSS - Blood pressure 118/61, pulse 92, temperature 98.7 F (37.1 C), temperature source Oral, resp. rate 20, height 6\' 2"  (1.88 m), weight 80.8 kg (178 lb 2.1 oz), SpO2 100 %.    IV in place, occlusive dsg intact without redness.  Orientation to room, and floor completed with information packet given to patient/family.  Patient declined safety video at this time.  Admission INP armband ID verified with patient/family, and in place.   SR up x 2, fall assessment complete, with patient and family able to verbalize understanding of risk associated with falls, and verbalized understanding to call nsg before up out of bed.  Call light within reach, patient able to voice, and demonstrate understanding.  Skin, clean-dry- intact without evidence of bruising, or skin tears.   No evidence of skin break down noted on exam.     Will cont to eval and treat per MD orders.  Dorris Carnes, RN 06/29/2016 5:39 PM

## 2016-06-29 NOTE — Progress Notes (Signed)
Report received from [facility] step[facility].

## 2016-06-29 NOTE — Progress Notes (Signed)
Pt arrived at the unit via wheelchair, at 1520.

## 2016-06-29 NOTE — Progress Notes (Signed)
PROGRESS NOTE  Sean Leonard  ZOX:[id number] DOB: 08/07/1945 DOA: 06/22/2016   PCP: Pcp Not In System   Brief Narrative:  71 y.o. male with hx of known diverticulosis with colonoscopy by Dr Felipe Drone 3 years ago, hx of Barretts' s/p EGD same time, DDD, hypothyrodism, bilateral iliac artery aneurysm, presented to the ER with BRBPR. He denied using NSAIDS, and had not been on ASA due to his prior hx of PUD.  Hb was found to be at 12.2g per dl, normal BUN, and normal INR.  BP on presentation was in the 70's. CT of the abd pelvis was done showing showing enlarged pancreatic duct, suspicious for chronic enlargement, and diverticulosis, with internal hemorrhoid.  Colonoscopy was done at [facility].  Patient re-bled and was sent to [facility] for bleeding scan and then IR for angiogram.  No source of bleeding found to this point.    Assessment & Plan:   Lower GI bleed -Colonoscopy- 4/2 by GI- Dr. Oneida Alar- Polypectomy, ascending colon diverticulosis had further bleeding got tagged blood cell scan/angiography but no source of bleeding found -s/p 11 units PRBC this admission -surgery consulting and subtotal colectomy felt to be last resort -colonoscopy yesterday with ascending colon ulcer s/p clipping, now we suspect the initial bleeding was diverticular and subsequent was post polypectomy, appears to be resolving -advance diet, transfer to tele -home in 1-2days if stable  Syncopal episode, Hypotension - several vasovagal episodes after bowel movement - BP stable now  Left upper extremity numbness- resolved, head CT neg for intracranial bleed, - No further stroke workup  ABLA, thrombocytopenia  - has been given FFP and PRBC (appears to be 11 units of PRBC) - transfuse another unit today  Leukocytosis - reactive, monitor  Hx of Barretts and PUD - No NSAIDS use and no ASA.  No evidence of UGIB.  DVT prophylaxis:  SCDs  Code Status: Full Family Communication: wife at bedside  Disposition Plan:  Tx to tele  Consultants:   Gastroenterology  Procedures:   Colonoscopy.  Bleeding scan  Angiogram   Antimicrobials:   None   Subjective: No bleeding overnight, feels ok  Objective: Vitals:   06/29/16 0400 06/29/16 0723 06/29/16 0800 06/29/16 1143  BP:  115/76 118/68   Pulse:  80 82   Resp:  16 20   Temp: 99.2 F (37.3 C) 98.9 F (37.2 C)  98.5 F (36.9 C)  TempSrc: Oral Oral  Oral  SpO2:  100% 99%   Weight:      Height:        Intake/Output Summary (Last 24 hours) at 06/29/16 1202 Last data filed at 06/29/16 0724  Gross per 24 hour  Intake               30 ml  Output             3000 ml  Net            -2970 ml   Filed Weights   06/24/16 1048 06/25/16 0500 06/25/16 1733  Weight: 78.5 kg (173 lb) 80.9 kg (178 lb 5.6 oz) 80.8 kg (178 lb 2.1 oz)    Examination:  General exam: Alert, awake, oriented x3 Respiratory system: Clear to auscultation. Respiratory effort normal. Cardiovascular system: S1 & S2 heard, RRR. No JVD, murmurs, rubs, gallops or clicks. No pedal edema. Gastrointestinal system: mildly tender, + BS Central nervous system: A+Ox3, NAD Extremities: Symmetric 5 x 5 power. Skin: No rashes, lesions or ulcers  Data Reviewed:  I have personally reviewed following labs and imaging studies  CBC:  Recent Labs Lab 06/22/16 2050  06/27/16 1443 06/27/16 2029 06/28/16 0733 06/28/16 2138 06/29/16 0738  WBC 10.0  < > 14.2* 14.1* 12.6* 7.5 7.7  NEUTROABS 3.0  --   --   --   --   --   --   HGB 11.7*  < > 7.6* 7.1* 7.1* 7.6* 7.8*  HCT 35.7*  < > 22.1* 20.4* 20.6* 22.4* 23.5*  MCV 90.8  < > 86.3 86.4 87.7 88.5 89.7  PLT 243  < > 109* 119* 147* 141* 178  < > = values in this interval not displayed. Basic Metabolic Panel:  Recent Labs Lab 06/23/16 1307 06/24/16 0423 06/27/16 1153 06/28/16 0306 06/29/16 0242  NA 139 137 137  136 134* 136  K 3.5 3.7 3.6  3.5 3.5 3.3*  CL 111 111 112*  111 105 103  CO2 23 23 21*  21* 24 27  GLUCOSE 148*  128* 86  87 104* 104*  BUN 13 10 10  9 8  5*  CREATININE 0.83 0.71 0.71  0.69 0.72 0.83  CALCIUM 8.1* 7.8* 7.0*  6.9* 7.3* 7.7*  MG 1.8  --   --   --   --    Liver Function Tests:  Recent Labs Lab 06/22/16 2050 06/23/16 1307 06/27/16 1153  AST 18 20 16   ALT 16* 13* 10*  ALKPHOS 50 38 27*  BILITOT 0.5 0.7 0.6  PROT 6.5 5.2* 3.4*  ALBUMIN 3.6 2.9* 1.7*    Recent Labs Lab 06/22/16 2050  LIPASE 33   Coagulation Profile:  Recent Labs Lab 06/22/16 2050  INR 0.97   Cardiac Enzymes:  Recent Labs Lab 06/22/16 2050 06/23/16 1307  TROPONINI <0.03 <0.03   CBG:  Recent Labs Lab 06/23/16 1234 06/24/16 0635 06/25/16 1416 06/28/16 2222  GLUCAP 152* 139* 108* 103*   Sepsis Labs:  Recent Labs Lab 06/22/16 2056 06/23/16 0900 06/23/16 1500  LATICACIDVEN 2.76* 2.5* 1.9    Recent Results (from the past 240 hour(s))  Culture, blood (Routine x 2)     Status: None   Collection Time: 06/22/16  9:08 PM  Result Value Ref Range Status   Specimen Description BLOOD LEFT ARM  Final   Special Requests BOTTLES DRAWN AEROBIC AND ANAEROBIC 6CC EACH  Final   Culture NO GROWTH 5 DAYS  Final   Report Status 06/27/2016 FINAL  Final  Culture, blood (Routine x 2)     Status: None   Collection Time: 06/22/16  9:25 PM  Result Value Ref Range Status   Specimen Description BLOOD RIGHT ARM  Final   Special Requests BOTTLES DRAWN AEROBIC AND ANAEROBIC 4CC EACH  Final   Culture NO GROWTH 5 DAYS  Final   Report Status 06/27/2016 FINAL  Final  Culture, blood (routine x 2)     Status: None   Collection Time: 06/23/16  1:08 PM  Result Value Ref Range Status   Specimen Description BLOOD  Final   Special Requests   Final    Blood Culture results may not be optimal due to an inadequate volume of blood received in culture bottles   Culture NO GROWTH 5 DAYS  Final   Report Status 06/28/2016 FINAL  Final  Culture, blood (routine x 2)     Status: None   Collection Time: 06/23/16  1:09 PM    Result Value Ref Range Status   Specimen Description BLOOD  Final   Special  Requests Blood Culture adequate volume  Final   Culture NO GROWTH 5 DAYS  Final   Report Status 06/28/2016 FINAL  Final  MRSA PCR Screening     Status: None   Collection Time: 06/23/16  7:47 PM  Result Value Ref Range Status   MRSA by PCR NEGATIVE NEGATIVE Final    Comment:        The GeneXpert MRSA Assay (FDA approved for NASAL specimens only), is one component of a comprehensive MRSA colonization surveillance program. It is not intended to diagnose MRSA infection nor to guide or monitor treatment for MRSA infections.     Radiology Studies: No results found. Scheduled Meds: . chlorhexidine  15 mL Mouth Rinse BID  . feeding supplement  1 Container Oral TID BM  . levothyroxine  112 mcg Oral QAC breakfast  . mouth rinse  15 mL Mouth Rinse q12n4p  . omega-3 acid ethyl esters  2 g Oral Daily  . pantoprazole  40 mg Oral QAC breakfast  . pravastatin  20 mg Oral QHS   Continuous Infusions:   LOS: 6 days   Domenic Polite, MD [facility] Pager [phone]  If 7PM-7AM, please contact night-coverage [email or web address] Password [email or web address] 06/29/2016, 12:02 PM

## 2016-06-29 NOTE — Progress Notes (Signed)
[facility] Progress Note 1 Day Post-Op  Subjective: Patient feeling well and has not had a bowel movment since yesterday.  Objective: Vital signs in last 24 hours: Temp:  [98.4 F (36.9 C)-99.3 F (37.4 C)] 98.5 F (36.9 C) (04/07 1143) Pulse Rate:  [80-115] 82 (04/07 0800) Resp:  [14-26] 20 (04/07 0800) BP: (104-166)/(61-94) 118/68 (04/07 0800) SpO2:  [96 %-100 %] 99 % (04/07 0800) Last BM Date: 06/28/16  Intake/Output from previous day: 04/06 0701 - 04/07 0700 In: 730 [P.O.:300; I.V.:400; Blood:30] Out: 2250 [Urine:2250] Intake/Output this shift: Total I/O In: -  Out: 750 [Urine:750]  General: No distress  Lungs: Clear  Abd: Soft, no tenderness  Extremities: No changes  Neuro: Intact  Lab Results:  @LABLAST2 (wbc:2,hgb:2,hct:2,plt:2) BMET ) Recent Labs  06/28/16 0306 06/29/16 0242  NA 134* 136  K 3.5 3.3*  CL 105 103  CO2 24 27  GLUCOSE 104* 104*  BUN 8 5*  CREATININE 0.72 0.83  CALCIUM 7.3* 7.7*   PT/INR No results for input(s): LABPROT, INR in the last 72 hours. ABG No results for input(s): PHART, HCO3 in the last 72 hours.  Invalid input(s): PCO2, PO2  Studies/Results: No results found.  Anti-infectives: Anti-infectives    None      Assessment/Plan: s/p Procedure(s): COLONOSCOPY WITH PROPOFOL Hemoglobin has been stable and the patient has not had a bowel movement since yesterday.  We will continue to follow.  LOS: 6 days   Kathryne Eriksson. Dahlia Bailiff, MD, FACS [phone] [facility] 06/29/2016

## 2016-06-29 NOTE — Progress Notes (Signed)
Patient ID: Sean Leonard, male   DOB: October 11, 1945, 71 y.o.   MRN: [id number]    Progress Note   Subjective   Feeling Ok, no further active bleeding since Colonoscopy yesterday HGB 7.8 this am   Objective   Vital signs in last 24 hours: Temp:  [98.4 F (36.9 C)-99.3 F (37.4 C)] 98.9 F (37.2 C) (04/07 0723) Pulse Rate:  [80-115] 82 (04/07 0800) Resp:  [14-26] 20 (04/07 0800) BP: (104-166)/(61-94) 118/68 (04/07 0800) SpO2:  [96 %-100 %] 99 % (04/07 0800) Last BM Date: 06/28/16 General:    Older male  in NAD Heart:  Regular rate and rhythm; no murmurs Lungs: Respirations even and unlabored, lungs CTA bilaterally Abdomen:  Soft, nontender and nondistended. Normal bowel sounds. Extremities:  Without edema. Neurologic:  Alert and oriented,  grossly normal neurologically. Psych:  Cooperative. Normal mood and affect.  Intake/Output from previous day: 04/06 0701 - 04/07 0700 In: 730 [P.O.:300; I.V.:400; Blood:30] Out: 2250 [Urine:2250] Intake/Output this shift: Total I/O In: -  Out: 750 [Urine:750]  Lab Results:  Recent Labs  06/28/16 0733 06/28/16 2138 06/29/16 0738  WBC 12.6* 7.5 7.7  HGB 7.1* 7.6* 7.8*  HCT 20.6* 22.4* 23.5*  PLT 147* 141* 178   BMET  Recent Labs  06/27/16 1153 06/28/16 0306 06/29/16 0242  NA 137  136 134* 136  K 3.6  3.5 3.5 3.3*  CL 112*  111 105 103  CO2 21*  21* 24 27  GLUCOSE 86  87 104* 104*  BUN 10  9 8  5*  CREATININE 0.71  0.69 0.72 0.83  CALCIUM 7.0*  6.9* 7.3* 7.7*   LFT  Recent Labs  06/27/16 1153  PROT 3.4*  ALBUMIN 1.7*  AST 16  ALT 10*  ALKPHOS 27*  BILITOT 0.6   PT/INR No results for input(s): LABPROT, INR in the last 72 hours.  Studies/Results: Ct Angio Abd/pel W/ And/or W/o  Result Date: 06/27/2016 CLINICAL DATA:  Lower abdominal pain, acute lower GI bleeding EXAM: CT ANGIOGRAPHY ABDOMEN AND PELVIS TECHNIQUE: Multidetector CT imaging of the abdomen and pelvis was performed using the standard  protocol during bolus administration of intravenous contrast. Multiplanar reconstructed images including MIPs were obtained and reviewed to evaluate the vascular anatomy. CONTRAST:  100 cc Isovue 350 COMPARISON:  06/22/2016, 06/26/2016 FINDINGS: Arterial findings: Aorta: Mild tortuosity and atherosclerotic changes of the abdominal aorta. Infrarenal aorta maximal AP diameter 2.7 cm and transverse 2.5 cm. No occlusive process, acute dissection, or retroperitoneal hemorrhage. Celiac axis: Widely patent origin and branches. No acute vascular finding or bleeding. Superior mesenteric: Widely patent origin and branches. Replaced right hepatic artery noted. No active contrast extravasation or bleeding demonstrated. Left renal:           Widely patent Right renal:          Atherosclerotic changes but widely patent Inferior mesenteric: Atherosclerotic origin but remains patent. No active GI bleeding within the IMA vascular distribution. Left iliac: Atherosclerotic changes and mild ectasia of the left common iliac artery. Left common iliac diameter is 14 mm. 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 Left common, internal and external iliac arteries remain patent. No occlusive process. Right iliac: Atherosclerotic changes and aneurysmal dilatation of the right common iliac artery, maximal diameter 2.2 cm, image 88. Aneurysm dilatation also noted of the right internal iliac artery measuring 16 mm, image 100. Right iliac vessels remain patent without occlusive process. Visualized common femoral, proximal  profunda femoral, and proximal superficial femoral arteries are also patent. Venous findings: Hepatic, portal, splenic, and superior mesenteric veins are all patent. IVC and renal veins are patent. Iliac and proximal femoral veins appear patent. No acute veno-occlusive process. Review of the MIP images confirms the above findings. Nonvascular findings: Minimal dependent  bibasilar atelectasis and trace pleural effusions. Normal heart size. No pericardial effusion. Liver, gallbladder, biliary system demonstrate no focal abnormality. No biliary duct dilatation. The pancreas demonstrates no focal abnormality, ductal dilatation or surrounding inflammatory process. Spleen is within normal limits. Adrenal glands demonstrate no acute abnormality. Kidneys demonstrate symmetric enhancement and excretion. No renal obstruction or hydronephrosis. Small hypodense cortical cysts in the right kidney mid and lower pole regions measuring 12 mm or less in size. Negative for bowel obstruction, significant dilatation, or free air. No fluid collection or abscess. Negative for ascites. Again, no evidence of active contrast extravasation within the bowel. Prostate gland is enlarged. Seminal vesicles are symmetric. Urinary bladder unremarkable. No inguinal abnormality or hernia.  Intact abdominal wall. Degenerative changes noted of the spine diffusely but most pronounced from L2-S1. IMPRESSION: Negative for significant active GI bleeding by CTA. Mesenteric vessels remain patent. Aortoiliac atherosclerosis with mild infrarenal aortic ectasia and small iliac aneurysms noted. No vascular occlusive process. No other acute intra-abdominal or pelvic finding. Trace bibasilar atelectasis and pleural effusions. Electronically Signed   By: Jerilynn Mages.  Shick M.D.   On: 06/27/2016 14:49       Assessment / Plan:    #1 71 yo male transferred from [facility]  With acute lower GI bleed . Colonoscopy repeated yesterday showing oozing from polypectomy site (from 4/2 colon ) in cecum and ascending diverticulosis .  Polyp site was endoclipped  No active bleeding since  Suspect he may have had a diverticular bleed initially , then bled from  polypectomy  with colon on 4/2. #2 anemia secondary to acute blood loss - transfuse for hgb 7.5 or lower - has required multiple transfusions , consider IV iron  #3 Barretts  esophagus   Plan; Per Dr Silverio Decamp - advance diet to soft Please give IV iron  Infusion Transfuse for hgb less than 7.5 Continue to observe at least another 24 hours  as he has had a massive bleed .      Principal Problem:   Lower GI bleed Active Problems:   Barrett's esophagus   Hematochezia   Colon ulcer     LOS: 6 days   Amy Esterwood  06/29/2016, 10:24 AM    Attending physician's note   I have taken an interval history, reviewed the chart and examined the patient. I agree with the Advanced Practitioner's note, impression and recommendations.  No evidence of ongoing GI bleed, hemoglobin remained stable overnight. Status post colonoscopy with hemostatic clip placement at prior polypectomy site in ascending colon. Advance diet to soft Continue to monitor hemoglobin daily IV iron infusion If no evidence of further bleed, okay to discharge home tomorrow We'll need to follow-up with outpatient GI in [place] Dr. Buford Dresser after discharge  K Denzil Magnuson, MD [phone] Mon-Fri 8a-5p [phone] after 5p, weekends, holidays

## 2016-06-30 ENCOUNTER — Encounter (HOSPITAL_COMMUNITY): Payer: Self-pay | Admitting: Gastroenterology

## 2016-06-30 DIAGNOSIS — K5793 Diverticulitis of intestine, part unspecified, without perforation or abscess with bleeding: Secondary | ICD-10-CM

## 2016-06-30 DIAGNOSIS — R109 Unspecified abdominal pain: Secondary | ICD-10-CM

## 2016-06-30 DIAGNOSIS — K922 Gastrointestinal hemorrhage, unspecified: Secondary | ICD-10-CM

## 2016-06-30 LAB — CBC
HEMATOCRIT: 24.1 % — AB (ref 39.0–52.0)
HEMOGLOBIN: 7.9 g/dL — AB (ref 13.0–17.0)
MCH: 29.7 pg (ref 26.0–34.0)
MCHC: 32.8 g/dL (ref 30.0–36.0)
MCV: 90.6 fL (ref 78.0–100.0)
Platelets: 250 10*3/uL (ref 150–400)
RBC: 2.66 MIL/uL — ABNORMAL LOW (ref 4.22–5.81)
RDW: 15.4 % (ref 11.5–15.5)
WBC: 8.8 10*3/uL (ref 4.0–10.5)

## 2016-06-30 MED ORDER — SODIUM CHLORIDE 0.9 % IV SOLN
500.0000 mg | Freq: Once | INTRAVENOUS | Status: AC
  Administered 2016-06-30: 500 mg via INTRAVENOUS
  Filled 2016-06-30 (×2): qty 10

## 2016-06-30 MED ORDER — FERROUS SULFATE 325 (65 FE) MG PO TABS
325.0000 mg | ORAL_TABLET | Freq: Two times a day (BID) | ORAL | Status: DC
  Administered 2016-06-30: 325 mg via ORAL
  Filled 2016-06-30: qty 1

## 2016-06-30 MED ORDER — FERROUS SULFATE 325 (65 FE) MG PO TABS: 325.0000 mg | ORAL_TABLET | Freq: Two times a day (BID) | ORAL | 2 refills | Status: DC

## 2016-06-30 MED ORDER — SODIUM CHLORIDE 0.9 % IV SOLN
25.0000 mg | Freq: Once | INTRAVENOUS | Status: AC
  Administered 2016-06-30: 25 mg via INTRAVENOUS
  Filled 2016-06-30: qty 0.5

## 2016-06-30 NOTE — Progress Notes (Signed)
2 Days Post-Op  Subjective: Tolerating po No abdominal pain No evidence of ongoing bleeding  Objective: Vital signs in last 24 hours: Temp:  [98.4 F (36.9 C)-98.7 F (37.1 C)] 98.4 F (36.9 C) (04/08 0620) Pulse Rate:  [83-92] 83 (04/08 0620) Resp:  [18-20] 18 (04/08 0620) BP: (111-118)/(59-72) 118/72 (04/08 0620) SpO2:  [95 %-100 %] 95 % (04/08 0620) Weight:  [77.3 kg (170 lb 6.4 oz)] 77.3 kg (170 lb 6.4 oz) (04/08 0620) Last BM Date: 06/28/16  Intake/Output from previous day: 04/07 0701 - 04/08 0700 In: 720 [P.O.:720] Out: 2550 [Urine:2550] Intake/Output this shift: No intake/output data recorded.  Exam: Looks well Abdomen soft, non-tender  Lab Results:   Recent Labs  06/29/16 2034 06/30/16 0732  WBC 8.6 8.8  HGB 7.8* 7.9*  HCT 23.1* 24.1*  PLT 214 250   BMET  Recent Labs  06/29/16 0242 06/29/16 2034  NA 136 136  K 3.3* 3.2*  CL 103 102  CO2 27 27  GLUCOSE 104* 139*  BUN 5* 7  CREATININE 0.83 0.85  CALCIUM 7.7* 7.8*   PT/INR No results for input(s): LABPROT, INR in the last 72 hours. ABG No results for input(s): PHART, HCO3 in the last 72 hours.  Invalid input(s): PCO2, PO2  Studies/Results: No results found.  Anti-infectives: Anti-infectives    None      Assessment/Plan: s/p Procedure(s): COLONOSCOPY WITH PROPOFOL (N/A)  Hgb stable.  No gross evidence of ongoing bleeding. Nothing further to offer from surgery. No outpt follow-up needed with [place]. Will see PRN  LOS: 7 days    Tarahji Ramthun A 06/30/2016

## 2016-06-30 NOTE — Discharge Summary (Addendum)
Physician Discharge Summary  Sean Leonard QBV:[id number] DOB: 1946-03-14 DOA: 06/22/2016  PCP: Pcp Not In System  Admit date: 06/22/2016 Discharge date: 06/30/2016  Time spent: 35 minutes  Recommendations for Outpatient Follow-up:  1. PCP in 1 week,pls check Hb at FU, started on PO Iron at discharge   Discharge Diagnoses:  Principal Problem:   Diverticular bleed   Ascending colon ulcer   Acute blood loss anemia   Barrett's esophagus   Hematochezia   Syncope   Hypotension   Abdominal pain   Acute lower GI bleeding   Discharge Condition: stable  Diet recommendation: heart healthy  Filed Weights   06/25/16 0500 06/25/16 1733 06/30/16 0620  Weight: 80.9 kg (178 lb 5.6 oz) 80.8 kg (178 lb 2.1 oz) 77.3 kg (170 lb 6.4 oz)    History of present illness:  71 y.o.malewith hx of known diverticulosis with colonoscopy by Dr Felipe Drone 3 years ago, hx of Barretts' s/p EGD same time, DDD, hypothyrodism, bilateral iliac artery aneurysm, presented to the ER with BRBPR. He denied using NSAIDS, and had not been on ASA due to his prior hx of PUD.  Hb was found to be at 12.2g per dl, normal BUN, and normal INR. BP on presentation was in the 70's. CT of the abd pelvis was done showing showing enlarged pancreatic duct, suspicious for chronic enlargement, and diverticulosis, with internal hemorrhoid.  Colonoscopy was done at [facility].  Patient re-bled and was sent to [facility] Course:  Lower GI bleed -Colonoscopy- 4/2 by GI- Dr. Oneida Alar at [facility]- noted diverticulosis and cecal Polyp, s/p polypectomy, then had continued bleeding got tagged blood cell scan/angiography but no source of bleeding found -surgery consulted, recommended repeat colonoscopy and subtotal colectomy felt to be last resort -given 12 units PRBC this admission -then underwent repeat colonoscopy 4/6 which noted ascending colon ulcer s/p clipping, now we suspect the initial bleeding was diverticular and subsequent was  post polypectomy, appears to have resolved now -Hb stable in 7.8-7.9 range for 2days, given IV Iron and discharged home on PO iron to FU with PCP and needs Hb rechecked in 1 week  Syncopal episode, Hypotension - due to blood loss/hemorrhagic shock and some vagal episodes after bowel movement on admission - resolved  Left upper extremity numbness- resolved, head CT neg for intracranial bleed, - No further stroke workup  ABLA, thrombocytopenia  - has been given FFP and PRBC (12 units of PRBC) -Hb 7.9 at discharge, given IV Iron and PO Iron at discharge  Leukocytosis - reactive, improved  Hx of Barretts and PUD - No NSAIDS use and no ASA. No evidence of UGIB. Continue PPI  Procedures: Colonoscopy. 4/2: Multiple small and large-mouthed diverticula were found in the ascending        colon. And cecal polyp s/p polypectomy  Bleeding scan  Angiogram    Colonoscopy 4/6:  Blood in the rectum, in the sigmoid colon, in the                            transverse colon and in the ascending colon.                           - A few ulcers in the ascending colon. Clip (MR  conditional) was placed.                           - Non-bleeding internal hemorrhoids.                           - Mild diverticulosis in the sigmoid colon and in                            the ascending colon. There was no evidence of                             diverticular bleeding.  Consultations: [facility] Discharge Exam: Vitals:   06/29/16 2109 06/30/16 0620  BP: (!) 111/59 118/72  Pulse: 91 83  Resp: 18 18  Temp: 98.7 F (37.1 C) 98.4 F (36.9 C)    General: AAOx3 Cardiovascular: S1S2/RRR Respiratory: CTAB  Discharge Instructions   Discharge Instructions    Diet - low sodium heart healthy    Complete by:  As directed    Increase activity slowly    Complete by:  As directed      Current Discharge Medication List    START taking these medications   Details   ferrous sulfate 325 (65 FE) MG tablet Take 1 tablet (325 mg total) by mouth 2 (two) times daily with a meal. Qty: 60 tablet, Refills: 2      CONTINUE these medications which have NOT CHANGED   Details  fish oil-omega-3 fatty acids 1000 MG capsule Take 2 g by mouth daily.    levothyroxine (SYNTHROID, LEVOTHROID) 112 MCG tablet Take 112 mcg by mouth every morning.    Multiple Vitamin (MULTIVITAMIN WITH MINERALS) TABS tablet Take 1 tablet by mouth daily.    omeprazole (PRILOSEC) 20 MG capsule TAKE 1 CAPSULE EVERY DAY Qty: 90 capsule, Refills: 0    pravastatin (PRAVACHOL) 20 MG tablet Take 20 mg by mouth at bedtime.       No Known Allergies Follow-up Information    PCP. Schedule an appointment as soon as possible for a visit in 1 week(s).            The results of significant diagnostics from this hospitalization (including imaging, microbiology, ancillary and laboratory) are listed below for reference.    Significant Diagnostic Studies: Ct Head Wo Contrast  Result Date: 06/23/2016 CLINICAL DATA:  Code stroke, LEFT facial and LEFT arm numbness, rectal bleeding, history ulcer disease, colonic diverticulosis EXAM: CT HEAD WITHOUT CONTRAST TECHNIQUE: Contiguous axial images were obtained from the base of the skull through the vertex without intravenous contrast. Sagittal and coronal MPR images reconstructed from axial data set. COMPARISON:  None FINDINGS: Brain: Mild generalized atrophy. Normal ventricular morphology. No midline shift or mass effect. Otherwise normal appearance of brain parenchyma. No intracranial hemorrhage, mass lesion or evidence acute infarction. No extra-axial fluid collections. Vascular: Unremarkable Skull: Intact Sinuses/Orbits: Clear Other: N/A IMPRESSION: No acute intracranial abnormality. Findings called to [facility] in ICU on 06/23/2016 at 1620 hours. Electronically Signed   By: Lavonia Dana M.D.   On: 06/23/2016 16:23   Nm Gi Blood Loss  Result Date:  06/26/2016 CLINICAL DATA:  71 y/o M; bright red blood per rectum and lower abdominal pain for several days. Bloody stools 1.5 hours prior to imaging. EXAM: NUCLEAR MEDICINE GASTROINTESTINAL BLEEDING SCAN TECHNIQUE: Sequential  abdominal images were obtained following intravenous administration of Tc-31m labeled red blood cells. RADIOPHARMACEUTICALS:  20.7 mCi Tc-73m in-vitro labeled red cells. COMPARISON:  06/22/2016 CT of the abdomen and pelvis. FINDINGS: On dynamic imaging there is progressive radiotracer accumulation within the bladder. Inferiorly at the level of the pelvic brim there is a second focus of radiotracer uptake with variable activity. No abnormal radiotracer uptake is seen passing through small or large bowel within the abdomen. IMPRESSION: Inferiorly at level of pelvic brim there is a focus of radiotracer uptake with variable activity likely representing active bleeding within the rectum. No abnormal radiotracer uptake is seen passing through small or large bowel to suggest a more proximal bleed. Electronically Signed   By: Kristine Garbe M.D.   On: 06/26/2016 04:42   Ct Abdomen Pelvis W Contrast  Result Date: 06/22/2016 CLINICAL DATA:  Acute onset of severe generalized abdominal pain. Bright red rectal bleeding. Diaphoresis. Initial encounter. EXAM: CT ABDOMEN AND PELVIS WITH CONTRAST TECHNIQUE: Multidetector CT imaging of the abdomen and pelvis was performed using the standard protocol following bolus administration of intravenous contrast. CONTRAST:  173mL ISOVUE-300 IOPAMIDOL (ISOVUE-300) INJECTION 61% COMPARISON:  CT of the abdomen and pelvis from 11/22/2014 FINDINGS: Lower chest: Minimal bibasilar atelectasis or scarring is noted. Scattered coronary artery calcifications are seen. Hepatobiliary: The liver is unremarkable in appearance. The gallbladder is unremarkable in appearance. The common bile duct remains normal in caliber. Pancreas: There is mild dilatation of the pancreatic  duct to 4-5 mm in maximal diameter, of uncertain significance. The pancreas is otherwise unremarkable. Spleen: The spleen is unremarkable in appearance. Adrenals/Urinary Tract: The adrenal glands are unremarkable in appearance. Small bilateral renal cysts are seen. Nonspecific perinephric stranding is noted bilaterally. There is no evidence of hydronephrosis. No renal or ureteral stones are identified. Stomach/Bowel: The stomach is unremarkable in appearance. The small bowel is within normal limits. The appendix is not visualized; there is no evidence for appendicitis. Mild diverticulosis is noted along the ascending colon. Mildly prominent vasculature is noted extending along the rectal wall, raising concern for internal hemorrhoids. Vascular/Lymphatic: Scattered calcification is seen along the abdominal aorta and its branches. Mild mural thrombus is seen along the common iliac arteries bilaterally. The abdominal aorta is otherwise grossly unremarkable. The inferior vena cava is grossly unremarkable. No retroperitoneal lymphadenopathy is seen. No pelvic sidewall lymphadenopathy is identified. Reproductive: The bladder is mildly distended and grossly unremarkable. The prostate is enlarged, measuring 5.3 cm in transverse dimension. Other: No additional soft tissue abnormalities are seen. Musculoskeletal: No acute osseous abnormalities are identified. Multilevel vacuum phenomenon is noted along the lumbar spine, with endplate sclerotic change. The visualized musculature is unremarkable in appearance. IMPRESSION: 1. Mildly prominent vasculature extending along the rectal wall, raising concern for internal hemorrhoids. 2. Mild dilatation of the pancreatic duct to 4-5 mm in maximal diameter, of uncertain significance. Pancreas otherwise unremarkable in appearance. This appears relatively stable from 2016 and may reflect the patient's baseline. 3. Scattered bilateral renal cysts. 4. Mild diverticulosis along the ascending  colon, without diverticulitis. 5. Scattered aortic atherosclerosis. 6. Enlarged prostate noted. 7. Degenerative change along the lumbar spine. Electronically Signed   By: Garald Balding M.D.   On: 06/22/2016 22:39   Ir Angiogram Visceral Selective  Result Date: 06/26/2016 INDICATION: Acute lower GI bleeding requiring transfusions EXAM: ULTRASOUND GUIDANCE FOR VASCULAR ACCESS CELIAC, SMA, AND IMA CATHETERIZATIONS AND ANGIOGRAMS MEDICATIONS: 1% lidocaine locally. ANESTHESIA/SEDATION: Moderate (conscious) sedation was employed during this procedure. A total of Versed 1.0 mg  and Fentanyl 25 mcg was administered intravenously. Moderate Sedation Time: 35 minutes. The patient's level of consciousness and vital signs were monitored continuously by radiology nursing throughout the procedure under my direct supervision. CONTRAST:  100 cc Isovue FLUOROSCOPY TIME:  Fluoroscopy Time: 6 minutes 24 seconds (460 mGy). COMPLICATIONS: None immediate. PROCEDURE: Informed consent was obtained from the patient following explanation of the procedure, risks, benefits and alternatives. The patient understands, agrees and consents for the procedure. All questions were addressed. A time out was performed prior to the initiation of the procedure. Maximal barrier sterile technique utilized including caps, mask, sterile gowns, sterile gloves, large sterile drape, hand hygiene, and Betadine prep. Under sterile conditions and local anesthesia, ultrasound micropuncture access performed of the right common femoral artery. 5 French sheath inserted over a Bentson guidewire. Initially, a C2 catheter was utilized to select the SMA. SMA angiogram performed. SMA is widely patent. Jejunal branches throughout the mesentery are patent. No evidence of active bleeding. Replaced right hepatic artery noted off of the proximal SMA, a normal variant. On the venous phase, the superior mesenteric vein and portal vein are patent. Catheter was exchanged for a  Mickelson catheter. This was reformed in the aorta and utilized to select the celiac origin. Celiac angiogram performed. Celiac origin is widely patent. Mild atherosclerotic change. Splenic, left gastric, and left hepatic vasculature are all patent. The gastroduodenal artery and gastroepiploic artery are visualized and patent. The catheter was retracted and utilized to select the IMA. Selective IMA angiogram performed. IMA is mildly atherosclerotic but patent. The superior hemorrhoidal and left colic branches are all patent. No evidence of active bleeding. On the venous phase, the IMV is patent. Access removed. Hemostasis obtained with the Exoseal device. No immediate complication. Patient tolerated the procedure well. IMPRESSION: Successful mesenteric angiograms of the celiac, SMA and IMA without evidence of active GI bleeding. Electronically Signed   By: Jerilynn Mages.  Shick M.D.   On: 06/26/2016 09:54   Ir Angiogram Visceral Selective  Result Date: 06/26/2016 INDICATION: Acute lower GI bleeding requiring transfusions EXAM: ULTRASOUND GUIDANCE FOR VASCULAR ACCESS CELIAC, SMA, AND IMA CATHETERIZATIONS AND ANGIOGRAMS MEDICATIONS: 1% lidocaine locally. ANESTHESIA/SEDATION: Moderate (conscious) sedation was employed during this procedure. A total of Versed 1.0 mg and Fentanyl 25 mcg was administered intravenously. Moderate Sedation Time: 35 minutes. The patient's level of consciousness and vital signs were monitored continuously by radiology nursing throughout the procedure under my direct supervision. CONTRAST:  100 cc Isovue FLUOROSCOPY TIME:  Fluoroscopy Time: 6 minutes 24 seconds (460 mGy). COMPLICATIONS: None immediate. PROCEDURE: Informed consent was obtained from the patient following explanation of the procedure, risks, benefits and alternatives. The patient understands, agrees and consents for the procedure. All questions were addressed. A time out was performed prior to the initiation of the procedure. Maximal  barrier sterile technique utilized including caps, mask, sterile gowns, sterile gloves, large sterile drape, hand hygiene, and Betadine prep. Under sterile conditions and local anesthesia, ultrasound micropuncture access performed of the right common femoral artery. 5 French sheath inserted over a Bentson guidewire. Initially, a C2 catheter was utilized to select the SMA. SMA angiogram performed. SMA is widely patent. Jejunal branches throughout the mesentery are patent. No evidence of active bleeding. Replaced right hepatic artery noted off of the proximal SMA, a normal variant. On the venous phase, the superior mesenteric vein and portal vein are patent. Catheter was exchanged for a Mickelson catheter. This was reformed in the aorta and utilized to select the celiac origin. Celiac angiogram performed.  Celiac origin is widely patent. Mild atherosclerotic change. Splenic, left gastric, and left hepatic vasculature are all patent. The gastroduodenal artery and gastroepiploic artery are visualized and patent. The catheter was retracted and utilized to select the IMA. Selective IMA angiogram performed. IMA is mildly atherosclerotic but patent. The superior hemorrhoidal and left colic branches are all patent. No evidence of active bleeding. On the venous phase, the IMV is patent. Access removed. Hemostasis obtained with the Exoseal device. No immediate complication. Patient tolerated the procedure well. IMPRESSION: Successful mesenteric angiograms of the celiac, SMA and IMA without evidence of active GI bleeding. Electronically Signed   By: Jerilynn Mages.  Shick M.D.   On: 06/26/2016 09:54   Ir Angiogram Visceral Selective  Result Date: 06/26/2016 INDICATION: Acute lower GI bleeding requiring transfusions EXAM: ULTRASOUND GUIDANCE FOR VASCULAR ACCESS CELIAC, SMA, AND IMA CATHETERIZATIONS AND ANGIOGRAMS MEDICATIONS: 1% lidocaine locally. ANESTHESIA/SEDATION: Moderate (conscious) sedation was employed during this procedure. A  total of Versed 1.0 mg and Fentanyl 25 mcg was administered intravenously. Moderate Sedation Time: 35 minutes. The patient's level of consciousness and vital signs were monitored continuously by radiology nursing throughout the procedure under my direct supervision. CONTRAST:  100 cc Isovue FLUOROSCOPY TIME:  Fluoroscopy Time: 6 minutes 24 seconds (460 mGy). COMPLICATIONS: None immediate. PROCEDURE: Informed consent was obtained from the patient following explanation of the procedure, risks, benefits and alternatives. The patient understands, agrees and consents for the procedure. All questions were addressed. A time out was performed prior to the initiation of the procedure. Maximal barrier sterile technique utilized including caps, mask, sterile gowns, sterile gloves, large sterile drape, hand hygiene, and Betadine prep. Under sterile conditions and local anesthesia, ultrasound micropuncture access performed of the right common femoral artery. 5 French sheath inserted over a Bentson guidewire. Initially, a C2 catheter was utilized to select the SMA. SMA angiogram performed. SMA is widely patent. Jejunal branches throughout the mesentery are patent. No evidence of active bleeding. Replaced right hepatic artery noted off of the proximal SMA, a normal variant. On the venous phase, the superior mesenteric vein and portal vein are patent. Catheter was exchanged for a Mickelson catheter. This was reformed in the aorta and utilized to select the celiac origin. Celiac angiogram performed. Celiac origin is widely patent. Mild atherosclerotic change. Splenic, left gastric, and left hepatic vasculature are all patent. The gastroduodenal artery and gastroepiploic artery are visualized and patent. The catheter was retracted and utilized to select the IMA. Selective IMA angiogram performed. IMA is mildly atherosclerotic but patent. The superior hemorrhoidal and left colic branches are all patent. No evidence of active bleeding.  On the venous phase, the IMV is patent. Access removed. Hemostasis obtained with the Exoseal device. No immediate complication. Patient tolerated the procedure well. IMPRESSION: Successful mesenteric angiograms of the celiac, SMA and IMA without evidence of active GI bleeding. Electronically Signed   By: Jerilynn Mages.  Shick M.D.   On: 06/26/2016 09:54   Ir US Guide Vasc Access Right  Result Date: 06/26/2016 INDICATION: Acute lower GI bleeding requiring transfusions EXAM: ULTRASOUND GUIDANCE FOR VASCULAR ACCESS CELIAC, SMA, AND IMA CATHETERIZATIONS AND ANGIOGRAMS MEDICATIONS: 1% lidocaine locally. ANESTHESIA/SEDATION: Moderate (conscious) sedation was employed during this procedure. A total of Versed 1.0 mg and Fentanyl 25 mcg was administered intravenously. Moderate Sedation Time: 35 minutes. The patient's level of consciousness and vital signs were monitored continuously by radiology nursing throughout the procedure under my direct supervision. CONTRAST:  100 cc Isovue FLUOROSCOPY TIME:  Fluoroscopy Time: 6 minutes 24 seconds (460  mGy). COMPLICATIONS: None immediate. PROCEDURE: Informed consent was obtained from the patient following explanation of the procedure, risks, benefits and alternatives. The patient understands, agrees and consents for the procedure. All questions were addressed. A time out was performed prior to the initiation of the procedure. Maximal barrier sterile technique utilized including caps, mask, sterile gowns, sterile gloves, large sterile drape, hand hygiene, and Betadine prep. Under sterile conditions and local anesthesia, ultrasound micropuncture access performed of the right common femoral artery. 5 French sheath inserted over a Bentson guidewire. Initially, a C2 catheter was utilized to select the SMA. SMA angiogram performed. SMA is widely patent. Jejunal branches throughout the mesentery are patent. No evidence of active bleeding. Replaced right hepatic artery noted off of the proximal SMA, a  normal variant. On the venous phase, the superior mesenteric vein and portal vein are patent. Catheter was exchanged for a Mickelson catheter. This was reformed in the aorta and utilized to select the celiac origin. Celiac angiogram performed. Celiac origin is widely patent. Mild atherosclerotic change. Splenic, left gastric, and left hepatic vasculature are all patent. The gastroduodenal artery and gastroepiploic artery are visualized and patent. The catheter was retracted and utilized to select the IMA. Selective IMA angiogram performed. IMA is mildly atherosclerotic but patent. The superior hemorrhoidal and left colic branches are all patent. No evidence of active bleeding. On the venous phase, the IMV is patent. Access removed. Hemostasis obtained with the Exoseal device. No immediate complication. Patient tolerated the procedure well. IMPRESSION: Successful mesenteric angiograms of the celiac, SMA and IMA without evidence of active GI bleeding. Electronically Signed   By: Jerilynn Mages.  Shick M.D.   On: 06/26/2016 09:54   Dg Chest Port 1 View  Result Date: 06/22/2016 CLINICAL DATA:  Abdominal pain and rectal bleeding. EXAM: PORTABLE CHEST 1 VIEW COMPARISON:  Chest radiograph 11/15/2014 FINDINGS: Lower lung volumes from prior exam. Stable elevation of left hemidiaphragm. Heart size and mediastinal contours are unchanged, borderline cardiomegaly. No pulmonary edema, large pleural effusion or pneumothorax. Stable osseous structures. Air within the stomach in the left upper quadrant. No obvious free air under the hemidiaphragm. Abdominal CT is planned. IMPRESSION: Borderline cardiomegaly.  Chronic elevation of left hemidiaphragm. Electronically Signed   By: Jeb Levering M.D.   On: 06/22/2016 21:24   Ct Angio Abd/pel W/ And/or W/o  Result Date: 06/27/2016 CLINICAL DATA:  Lower abdominal pain, acute lower GI bleeding EXAM: CT ANGIOGRAPHY ABDOMEN AND PELVIS TECHNIQUE: Multidetector CT imaging of the abdomen and pelvis  was performed using the standard protocol during bolus administration of intravenous contrast. Multiplanar reconstructed images including MIPs were obtained and reviewed to evaluate the vascular anatomy. CONTRAST:  100 cc Isovue 350 COMPARISON:  06/22/2016, 06/26/2016 FINDINGS: Arterial findings: Aorta: Mild tortuosity and atherosclerotic changes of the abdominal aorta. Infrarenal aorta maximal AP diameter 2.7 cm and transverse 2.5 cm. No occlusive process, acute dissection, or retroperitoneal hemorrhage. Celiac axis: Widely patent origin and branches. No acute vascular finding or bleeding. Superior mesenteric: Widely patent origin and branches. Replaced right hepatic artery noted. No active contrast extravasation or bleeding demonstrated. Left renal:           Widely patent Right renal:          Atherosclerotic changes but widely patent Inferior mesenteric: Atherosclerotic origin but remains patent. No active GI bleeding within the IMA vascular distribution. Left iliac: Atherosclerotic changes and mild ectasia of the left common iliac artery. Left common iliac diameter is 14 mm. Mild aneurysmal dilatation of the left  internal iliac artery measuring 13 mm in diameter, image 98. Left common, internal and external iliac arteries remain patent. No occlusive process. Right iliac: Atherosclerotic changes and aneurysmal dilatation of the right common iliac artery, maximal diameter 2.2 cm, image 88. Aneurysm dilatation also noted of the right internal iliac artery measuring 16 mm, image 100. Right iliac vessels remain patent without occlusive process. Visualized common femoral, proximal profunda femoral, and proximal superficial femoral arteries are also patent. Venous findings: Hepatic, portal, splenic, and superior mesenteric veins are all patent. IVC and renal veins are patent. Iliac and proximal femoral veins appear patent. No acute veno-occlusive process. Review of the MIP images confirms the above findings.  Nonvascular findings: Minimal dependent bibasilar atelectasis and trace pleural effusions. Normal heart size. No pericardial effusion. Liver, gallbladder, biliary system demonstrate no focal abnormality. No biliary duct dilatation. The pancreas demonstrates no focal abnormality, ductal dilatation or surrounding inflammatory process. Spleen is within normal limits. Adrenal glands demonstrate no acute abnormality. Kidneys demonstrate symmetric enhancement and excretion. No renal obstruction or hydronephrosis. Small hypodense cortical cysts in the right kidney mid and lower pole regions measuring 12 mm or less in size. Negative for bowel obstruction, significant dilatation, or free air. No fluid collection or abscess. Negative for ascites. Again, no evidence of active contrast extravasation within the bowel. Prostate gland is enlarged. Seminal vesicles are symmetric. Urinary bladder unremarkable. No inguinal abnormality or hernia.  Intact abdominal wall. Degenerative changes noted of the spine diffusely but most pronounced from L2-S1. IMPRESSION: Negative for significant active GI bleeding by CTA. Mesenteric vessels remain patent. Aortoiliac atherosclerosis with mild infrarenal aortic ectasia and small iliac aneurysms noted. No vascular occlusive process. No other acute intra-abdominal or pelvic finding. Trace bibasilar atelectasis and pleural effusions. Electronically Signed   By: Jerilynn Mages.  Shick M.D.   On: 06/27/2016 14:49    Microbiology: Recent Results (from the past 240 hour(s))  Culture, blood (Routine x 2)     Status: None   Collection Time: 06/22/16  9:08 PM  Result Value Ref Range Status   Specimen Description BLOOD LEFT ARM  Final   Special Requests BOTTLES DRAWN AEROBIC AND ANAEROBIC 6CC EACH  Final   Culture NO GROWTH 5 DAYS  Final   Report Status 06/27/2016 FINAL  Final  Culture, blood (Routine x 2)     Status: None   Collection Time: 06/22/16  9:25 PM  Result Value Ref Range Status   Specimen  Description BLOOD RIGHT ARM  Final   Special Requests BOTTLES DRAWN AEROBIC AND ANAEROBIC 4CC EACH  Final   Culture NO GROWTH 5 DAYS  Final   Report Status 06/27/2016 FINAL  Final  Culture, blood (routine x 2)     Status: None   Collection Time: 06/23/16  1:08 PM  Result Value Ref Range Status   Specimen Description BLOOD  Final   Special Requests   Final    Blood Culture results may not be optimal due to an inadequate volume of blood received in culture bottles   Culture NO GROWTH 5 DAYS  Final   Report Status 06/28/2016 FINAL  Final  Culture, blood (routine x 2)     Status: None   Collection Time: 06/23/16  1:09 PM  Result Value Ref Range Status   Specimen Description BLOOD  Final   Special Requests Blood Culture adequate volume  Final   Culture NO GROWTH 5 DAYS  Final   Report Status 06/28/2016 FINAL  Final  MRSA PCR Screening  Status: None   Collection Time: 06/23/16  7:47 PM  Result Value Ref Range Status   MRSA by PCR NEGATIVE NEGATIVE Final    Comment:        The GeneXpert MRSA Assay (FDA approved for NASAL specimens only), is one component of a comprehensive MRSA colonization surveillance program. It is not intended to diagnose MRSA infection nor to guide or monitor treatment for MRSA infections.      Labs: Basic Metabolic Panel:  Recent Labs Lab 06/24/16 0423 06/27/16 1153 06/28/16 0306 06/29/16 0242 06/29/16 2034  NA 137 137  136 134* 136 136  K 3.7 3.6  3.5 3.5 3.3* 3.2*  CL 111 112*  111 105 103 102  CO2 23 21*  21* 24 27 27   GLUCOSE 128* 86  87 104* 104* 139*  BUN 10 10  9 8  5* 7  CREATININE 0.71 0.71  0.69 0.72 0.83 0.85  CALCIUM 7.8* 7.0*  6.9* 7.3* 7.7* 7.8*   Liver Function Tests:  Recent Labs Lab 06/27/16 1153  AST 16  ALT 10*  ALKPHOS 27*  BILITOT 0.6  PROT 3.4*  ALBUMIN 1.7*   No results for input(s): LIPASE, AMYLASE in the last 168 hours. No results for input(s): AMMONIA in the last 168 hours. CBC:  Recent  Labs Lab 06/28/16 0733 06/28/16 2138 06/29/16 0738 06/29/16 2034 06/30/16 0732  WBC 12.6* 7.5 7.7 8.6 8.8  HGB 7.1* 7.6* 7.8* 7.8* 7.9*  HCT 20.6* 22.4* 23.5* 23.1* 24.1*  MCV 87.7 88.5 89.7 89.5 90.6  PLT 147* 141* 178 214 250   Cardiac Enzymes: No results for input(s): CKTOTAL, CKMB, CKMBINDEX, TROPONINI in the last 168 hours. BNP: BNP (last 3 results) No results for input(s): BNP in the last 8760 hours.  ProBNP (last 3 results) No results for input(s): PROBNP in the last 8760 hours.  CBG:  Recent Labs Lab 06/24/16 0635 06/25/16 1416 06/28/16 2222  GLUCAP 139* 108* 103*       SignedDomenic Polite MD.  [facility] 06/30/2016, 1:26 PM

## 2016-06-30 NOTE — Progress Notes (Signed)
Carloyn Jaeger to be D/C'd Home per MD order.  Discussed with the patient and all questions fully answered.  VSS, Skin clean, dry and intact without evidence of skin break down, no evidence of skin tears noted. IV catheter discontinued intact. Site without signs and symptoms of complications. Dressing and pressure applied.  An After Visit Summary was printed and given to the patient. Patient received prescription.  D/c education completed with patient/family including follow up instructions, medication list, d/c activities limitations if indicated, with other d/c instructions as indicated by MD - patient able to verbalize understanding, all questions fully answered.   Patient instructed to return to ED, call 911, or call MD for any changes in condition.   Patient escorted via [place], and D/C home via private auto.  Dorris Carnes 06/30/2016 2:36 PM

## 2016-06-30 NOTE — Progress Notes (Signed)
Patient ID: Sean Leonard, male   DOB: 06-30-1945, 71 y.o.   MRN: [id number]    Progress Note   Subjective   Feeling fine, no complaints. No BM since procedure  HGB 7.9 stable   Objective   Vital signs in last 24 hours: Temp:  [98.4 F (36.9 C)-98.7 F (37.1 C)] 98.4 F (36.9 C) (04/08 0620) Pulse Rate:  [83-92] 83 (04/08 0620) Resp:  [18-20] 18 (04/08 0620) BP: (111-118)/(59-72) 118/72 (04/08 0620) SpO2:  [95 %-100 %] 95 % (04/08 0620) Weight:  [170 lb 6.4 oz (77.3 kg)] 170 lb 6.4 oz (77.3 kg) (04/08 0620) Last BM Date: 06/28/16 General:  [name] in NAD Heart:  Regular rate and rhythm; no murmurs Lungs: Respirations even and unlabored, lungs CTA bilaterally Abdomen:  Soft, nontender and nondistended. Normal bowel sounds. Extremities:  Without edema. Neurologic:  Alert and oriented,  grossly normal neurologically. Psych:  Cooperative. Normal mood and affect.  Intake/Output from previous day: 04/07 0701 - 04/08 0700 In: 720 [P.O.:720] Out: 2550 [Urine:2550] Intake/Output this shift: No intake/output data recorded.  Lab Results:  Recent Labs  06/29/16 0738 06/29/16 2034 06/30/16 0732  WBC 7.7 8.6 8.8  HGB 7.8* 7.8* 7.9*  HCT 23.5* 23.1* 24.1*  PLT 178 214 250   BMET  Recent Labs  06/28/16 0306 06/29/16 0242 06/29/16 2034  NA 134* 136 136  K 3.5 3.3* 3.2*  CL 105 103 102  CO2 24 27 27   GLUCOSE 104* 104* 139*  BUN 8 5* 7  CREATININE 0.72 0.83 0.85  CALCIUM 7.3* 7.7* 7.8*   LFT  Recent Labs  06/27/16 1153  PROT 3.4*  ALBUMIN 1.7*  AST 16  ALT 10*  ALKPHOS 27*  BILITOT 0.6   PT/INR No results for input(s): LABPROT, INR in the last 72 hours.  Studies/Results: No results found.     Assessment / Plan:    #1 71 yo AA male with acute lower GI bleed transferred from [facility]  4/5  Pt had diverticular bleed  From ascending colon diverticuli, then also had a cecal polyp removed at the time of initial Colonoscopy by Dr Oneida Alar on 4/2     Continued bleeding felt secondary to post polyp bleed  Stable s/p colonoscopy with  hemoclipping of polyp site  4/6   #2 Anemia- secondary to acute bleed - required several units of blood #3 barrets esophagus  Plan ; Stable OK for discharge from GI standpoint today - need to follow up with Dr Sydell Axon [facility] ) later this week for repeat HGB  No ASA [place] Mask reviewed  Principal Problem:   Lower GI bleed Active Problems:   Barrett's esophagus   Hematochezia   Colon ulcer     LOS: 7 days   Amy Esterwood  06/30/2016, 9:54 AM   Attending physician's note   I have taken an interval history, reviewed the chart and examined the patient. I agree with the Advanced Practitioner's note, impression and recommendations.  No further episodes of hematochezia, hgb stable. IV iron infusionX1 prior to discharge and continue oral iron, ferrous sulfate 325 mg 3 times a day with meals. Follow up with outpatient GI. Okay to discharge home today from GI perspective. Discussed his case with Dr. Marya Amsler, MD [phone] Mon-Fri 8a-5p [phone] after 5p, weekends, holidays

## 2016-07-03 ENCOUNTER — Telehealth: Payer: Self-pay | Admitting: Gastroenterology

## 2016-07-03 MED FILL — Medication: Qty: 1 | Status: AC

## 2016-07-03 NOTE — Telephone Encounter (Signed)
Please call pt. He had A simple adenoma removed.  DRINK WATER TO KEEP YOUR URINE LIGHT YELLOW. FOLLOW A HIGH FIBER DIET. AVOID ITEMS THAT CAUSE BLOATING & GAS. NEXT COLONOSCOPY IN 5-10 YEARS.

## 2016-07-04 NOTE — Telephone Encounter (Signed)
Reminder in epic °

## 2016-07-04 NOTE — Telephone Encounter (Signed)
Tried to call with no answer  

## 2016-07-05 DIAGNOSIS — E782 Mixed hyperlipidemia: Secondary | ICD-10-CM | POA: Diagnosis not present

## 2016-07-05 DIAGNOSIS — E039 Hypothyroidism, unspecified: Secondary | ICD-10-CM | POA: Diagnosis not present

## 2016-07-05 DIAGNOSIS — K633 Ulcer of intestine: Secondary | ICD-10-CM | POA: Diagnosis not present

## 2016-07-05 DIAGNOSIS — K625 Hemorrhage of anus and rectum: Secondary | ICD-10-CM | POA: Diagnosis not present

## 2016-07-05 DIAGNOSIS — Z6821 Body mass index (BMI) 21.0-21.9, adult: Secondary | ICD-10-CM | POA: Diagnosis not present

## 2016-07-05 DIAGNOSIS — K5730 Diverticulosis of large intestine without perforation or abscess without bleeding: Secondary | ICD-10-CM | POA: Diagnosis not present

## 2016-07-05 NOTE — Telephone Encounter (Signed)
Tried to call pt- NA- LMOM 

## 2016-07-08 NOTE — Telephone Encounter (Signed)
Letter mailed to the pt. 

## 2016-07-15 ENCOUNTER — Telehealth: Payer: Self-pay | Admitting: Internal Medicine

## 2016-07-15 NOTE — Telephone Encounter (Signed)
I have mailed high fiber diet information to the pt.

## 2016-07-15 NOTE — Telephone Encounter (Signed)
Pt called to cancel his OV on Wednesday because he is following up with his PCP. He wanted to know what a high fiber diet was. Can we mail him info on that or call him to explain. [phone]

## 2016-07-16 NOTE — Telephone Encounter (Signed)
Pt is aware that it is coming in the mail

## 2016-07-17 ENCOUNTER — Ambulatory Visit: Payer: Medicare HMO | Admitting: Nurse Practitioner

## 2016-07-24 DIAGNOSIS — K5730 Diverticulosis of large intestine without perforation or abscess without bleeding: Secondary | ICD-10-CM | POA: Diagnosis not present

## 2016-07-24 DIAGNOSIS — K625 Hemorrhage of anus and rectum: Secondary | ICD-10-CM | POA: Diagnosis not present

## 2016-07-24 DIAGNOSIS — M25532 Pain in left wrist: Secondary | ICD-10-CM | POA: Diagnosis not present

## 2016-07-24 DIAGNOSIS — E782 Mixed hyperlipidemia: Secondary | ICD-10-CM | POA: Diagnosis not present

## 2016-07-24 DIAGNOSIS — E039 Hypothyroidism, unspecified: Secondary | ICD-10-CM | POA: Diagnosis not present

## 2016-07-24 DIAGNOSIS — K633 Ulcer of intestine: Secondary | ICD-10-CM | POA: Diagnosis not present

## 2016-07-24 DIAGNOSIS — Z6821 Body mass index (BMI) 21.0-21.9, adult: Secondary | ICD-10-CM | POA: Diagnosis not present

## 2016-07-25 DIAGNOSIS — M25532 Pain in left wrist: Secondary | ICD-10-CM | POA: Diagnosis not present

## 2016-07-29 DIAGNOSIS — H5203 Hypermetropia, bilateral: Secondary | ICD-10-CM | POA: Diagnosis not present

## 2016-07-29 DIAGNOSIS — H524 Presbyopia: Secondary | ICD-10-CM | POA: Diagnosis not present

## 2016-07-29 DIAGNOSIS — H52209 Unspecified astigmatism, unspecified eye: Secondary | ICD-10-CM | POA: Diagnosis not present

## 2016-07-30 DIAGNOSIS — K5730 Diverticulosis of large intestine without perforation or abscess without bleeding: Secondary | ICD-10-CM | POA: Diagnosis not present

## 2016-07-30 DIAGNOSIS — K625 Hemorrhage of anus and rectum: Secondary | ICD-10-CM | POA: Diagnosis not present

## 2016-07-30 DIAGNOSIS — K633 Ulcer of intestine: Secondary | ICD-10-CM | POA: Diagnosis not present

## 2016-07-30 DIAGNOSIS — Z6821 Body mass index (BMI) 21.0-21.9, adult: Secondary | ICD-10-CM | POA: Diagnosis not present

## 2016-07-30 DIAGNOSIS — E782 Mixed hyperlipidemia: Secondary | ICD-10-CM | POA: Diagnosis not present

## 2016-07-30 DIAGNOSIS — E039 Hypothyroidism, unspecified: Secondary | ICD-10-CM | POA: Diagnosis not present

## 2016-08-13 ENCOUNTER — Ambulatory Visit: Payer: Medicare HMO | Admitting: Nurse Practitioner

## 2016-08-22 ENCOUNTER — Ambulatory Visit (INDEPENDENT_AMBULATORY_CARE_PROVIDER_SITE_OTHER): Payer: Medicare HMO | Admitting: Nurse Practitioner

## 2016-08-22 ENCOUNTER — Encounter: Payer: Self-pay | Admitting: Nurse Practitioner

## 2016-08-22 VITALS — BP 118/74 | HR 87 | Temp 97.7°F | Ht 74.0 in | Wt 176.6 lb

## 2016-08-22 DIAGNOSIS — K922 Gastrointestinal hemorrhage, unspecified: Secondary | ICD-10-CM | POA: Diagnosis not present

## 2016-08-22 DIAGNOSIS — K5900 Constipation, unspecified: Secondary | ICD-10-CM | POA: Diagnosis not present

## 2016-08-22 NOTE — Patient Instructions (Signed)
1. Continue taking your stool softener. 2. He can use MiraLAX over-the-counter, 1 capful (17 g) mixed into a beverage of your choice, as needed for constipation despite stool softeners. 3. Return for follow-up in 2 months. 4. Call us before then if you have worsening fatigue/weakness, see blood in your stoolss

## 2016-08-22 NOTE — Progress Notes (Signed)
Referring Provider: Iona Beard, MD Primary Care Physician:  Lavella Lemons, PA Primary GI:  Dr. Gala Romney  Chief Complaint  Patient presents with  . Anemia    HPI:   Sean Leonard is a 71 y.o. male who presents For hospital follow-up. The patient was admitted to the hospital from 06/22/2016 through 06/30/2016 for diverticular bleed, lower GI bleeding, descending colon ulcer. Colonoscopy was completed on April 2 which noted diverticula and cecal polyp status post polypectomy. The patient had continued bleeding and a tagged bleeding scan was completed found no source of bleeding. Surgery recommended repeat colonoscopy and subtotal colectomy felt to be last resort. The patient was given a total of 12 units of blood during the admission. A repeat colonoscopy completed 06/28/2016 found ascending colon ulcers status post clipping which appeared to resolve bleeding. Hemoglobin was stable 7.8-7.9 for 2 days. The patient was given IV iron and discharged home in satisfactory condition.  Polypectomy on colonoscopy found to be simple adenoma and recommended repeat colonoscopy in 5-10 years.  Today he states he's doing great.l Has had CBC with PCP (not in the system) and last CBC was within the past 1-2 weeks. He was told everything looks good, his hgb was up to 9.9. Denies abdominal pain, N/V, hematochezia. Is having dark stools but on iron.  Energy is much improved than when he was in the hospital, but does still have some fatigue. Some mild constipation due to iron intake. Denies chest pain, dyspnea, dizziness, lightheadedness, syncope, near syncope. Denies any other upper or lower GI symptoms.  Past Medical History:  Diagnosis Date  . DDD (degenerative disc disease), lumbar   . Diverticula of colon   . Iliac artery aneurysm, bilateral (HCC)    with significant stenosis of right internal iliac artery  . Peptic ulcer   . Prostatitis   . Thyroid disease     Past Surgical History:  Procedure  Laterality Date  . COLONOSCOPY  April 2005   Dr. Irving Shows. Small internal hemorrhoids. Multiple small scattered diverticula in the cecum and descending colon.  . COLONOSCOPY N/A 01/18/2014   Procedure: COLONOSCOPY;  Surgeon: Daneil Dolin, MD;  Location: [facility];  Service: Endoscopy;  Laterality: N/A;  1100  . COLONOSCOPY N/A 06/23/2016   Procedure: COLONOSCOPY;  Surgeon: Rogene Houston, MD;  Location: [facility];  Service: Endoscopy;  Laterality: N/A;  . COLONOSCOPY WITH ESOPHAGOGASTRODUODENOSCOPY (EGD) N/A 12/16/2012   Dr. Gala Romney: rectal and colonic polyps with inadequate preparation, tubular adenoma. EGD with biopsy proven short segment Barrett's esophagus, hiatal hernia, mild chronic inactive gastritis   . COLONOSCOPY WITH PROPOFOL N/A 06/24/2016   Procedure: COLONOSCOPY WITH PROPOFOL;  Surgeon: Danie Binder, MD;  Location: [facility];  Service: Endoscopy;  Laterality: N/A;  . COLONOSCOPY WITH PROPOFOL N/A 06/28/2016   Procedure: COLONOSCOPY WITH PROPOFOL;  Surgeon: Mauri Pole, MD;  Location: [facility];  Service: Endoscopy;  Laterality: N/A;  . ESOPHAGOGASTRODUODENOSCOPY  July 2005   Dr. Irving Shows grade 2 esophagitis at the GE junction. Acute gastritis. Multiple acute active, deep, irregular, friable ulcers in the antrum and body of the stomach. 2 active, deep, friable, irregular ulcers in the duodenum. Pathology not available.  . ESOPHAGOGASTRODUODENOSCOPY N/A 01/18/2014   Procedure: ESOPHAGOGASTRODUODENOSCOPY (EGD);  Surgeon: Daneil Dolin, MD;  [facility]: [facility];  Service: Endoscopy;  Laterality: N/A;  . IR ANGIOGRAM VISCERAL SELECTIVE  06/26/2016  . IR ANGIOGRAM VISCERAL SELECTIVE  06/26/2016  . IR  ANGIOGRAM VISCERAL SELECTIVE  06/26/2016  . IR US GUIDE VASC ACCESS RIGHT  06/26/2016  . POLYPECTOMY  06/24/2016   Procedure: POLYPECTOMY;  Surgeon: Danie Binder, MD;  Location: [facility];  Service: Endoscopy;;  cecal  . THYROIDECTOMY    . TONSILLECTOMY       Current Outpatient Prescriptions  Medication Sig Dispense Refill  . ferrous sulfate 325 (65 FE) MG tablet Take 1 tablet (325 mg total) by mouth 2 (two) times daily with a meal. 60 tablet 2  . fish oil-omega-3 fatty acids 1000 MG capsule Take 2 g by mouth daily.    Marland Kitchen levothyroxine (SYNTHROID, LEVOTHROID) 112 MCG tablet Take 112 mcg by mouth every morning.    . Multiple Vitamin (MULTIVITAMIN WITH MINERALS) TABS tablet Take 1 tablet by mouth daily.    Marland Kitchen omeprazole (PRILOSEC) 20 MG capsule TAKE 1 CAPSULE EVERY DAY 90 capsule 0  . pravastatin (PRAVACHOL) 20 MG tablet Take 20 mg by mouth at bedtime.     No current facility-administered medications for this visit.     Allergies as of 08/22/2016  . (No Known Allergies)    Family History  Problem Relation Age of Onset  . Hypertension Mother   . Heart disease Mother        before age 85  . Heart disease Father        before age 74  . Hyperlipidemia Sister   . Cancer Sister   . Colon cancer Neg Hx   . Liver disease Neg Hx     Social History   Social History  . Marital status: Married    Spouse name: N/A  . Number of children: N/A  . Years of education: N/A   Social History Main Topics  . Smoking status: Former Smoker    Years: 25.00    Types: Cigars    Quit date: 11/27/2006  . Smokeless tobacco: Never Used     Comment: Quit in 2009  . Alcohol use No     Comment: former. weekend drinker (liqour) before, May 2015 last time he drank during a Therapist, nutritional.   . Drug use: No  . Sexual activity: Not Asked   Other Topics Concern  . None   Social History Narrative   1 deceased child. 1 living    Review of Systems: General: Negative for anorexia, weight loss, fever, chills. Admits weakness or fatigue. ENT: Negative for hoarseness, difficulty swallowing. CV: Negative for chest pain, angina, palpitations, peripheral edema.  Respiratory: Negative for dyspnea at rest, cough, sputum, wheezing.  GI: See history of present  illness. Endo: Negative for unusual weight change.  Heme: Negative for bruising or bleeding.   Physical Exam: BP 118/74   Pulse 87   Temp 97.7 F (36.5 C) (Oral)   Ht 6\' 2"  (1.88 m)   Wt 176 lb 9.6 oz (80.1 kg)   BMI 22.67 kg/m  General:   Alert and oriented. Pleasant and cooperative. Well-nourished and well-developed.  Eyes:  Without icterus, sclera clear and conjunctiva pink.  Ears:  Normal auditory acuity. Cardiovascular:  S1, S2 present without murmurs appreciated. Extremities without clubbing or edema. Respiratory:  Clear to auscultation bilaterally. No wheezes, rales, or rhonchi. No distress.  Gastrointestinal:  +BS, soft, non-tender and non-distended. No HSM noted. No guarding or rebound. No masses appreciated.  Rectal:  Deferred  Musculoskalatal:  Symmetrical without gross deformities. Neurologic:  Alert and oriented x4;  grossly normal neurologically. Psych:  Alert and cooperative. Normal mood and affect.  Heme/Lymph/Immune: No excessive bruising noted.    08/22/2016 11:17 AM   Disclaimer: This note was dictated with voice recognition software. Similar sounding words can inadvertently be transcribed and may not be corrected upon review.

## 2016-08-22 NOTE — Progress Notes (Signed)
cc'ed to pcp °

## 2016-08-22 NOTE — Assessment & Plan Note (Signed)
Admits some mild constipation because of oral iron. He is on Colace stool softeners currently. I will advise him to add MiraLAX as needed for productive, soft bowel movements without straining. Return for follow-up in 2 months.

## 2016-08-22 NOTE — Assessment & Plan Note (Signed)
The patient was admitted for acute lower GI bleed. Deemed diverticular in nature initially. Subsequent bleed due to descending colon ulcer which was clipped on colonoscopy. Based on polyp surgical pathology recommend 5-10 year repeat colonoscopy. He is doing quite well today. His primary care just checked his CBC within the past couple weeks and his hemoglobin was 9.9, per patient report. We will request these records to scan into our system. Return for follow-up in 2 months and check CBC at that time. ER precautions given for recurrent bleeding. Call us before 2 months if he has recurrent bleeding or worsening weakness/fatigue.

## 2016-10-18 ENCOUNTER — Ambulatory Visit: Payer: Medicare HMO | Admitting: Gastroenterology

## 2016-10-22 ENCOUNTER — Ambulatory Visit: Payer: Medicare HMO | Admitting: Nurse Practitioner

## 2016-11-14 ENCOUNTER — Telehealth: Payer: Self-pay | Admitting: *Deleted

## 2016-11-14 NOTE — Telephone Encounter (Signed)
Patient called stating that he is experiencing bilateral leg pain.  He does not express other symptoms only that they hurt like a "toothache".  An appointment was scheduled for 12/10/2016 with the NP and patient states that he will call if worsening pain occurs.

## 2016-12-02 DIAGNOSIS — K633 Ulcer of intestine: Secondary | ICD-10-CM | POA: Diagnosis not present

## 2016-12-02 DIAGNOSIS — K5730 Diverticulosis of large intestine without perforation or abscess without bleeding: Secondary | ICD-10-CM | POA: Diagnosis not present

## 2016-12-02 DIAGNOSIS — M25532 Pain in left wrist: Secondary | ICD-10-CM | POA: Diagnosis not present

## 2016-12-02 DIAGNOSIS — K219 Gastro-esophageal reflux disease without esophagitis: Secondary | ICD-10-CM | POA: Diagnosis not present

## 2016-12-02 DIAGNOSIS — K625 Hemorrhage of anus and rectum: Secondary | ICD-10-CM | POA: Diagnosis not present

## 2016-12-02 DIAGNOSIS — Z6821 Body mass index (BMI) 21.0-21.9, adult: Secondary | ICD-10-CM | POA: Diagnosis not present

## 2016-12-02 DIAGNOSIS — E039 Hypothyroidism, unspecified: Secondary | ICD-10-CM | POA: Diagnosis not present

## 2016-12-02 DIAGNOSIS — Z23 Encounter for immunization: Secondary | ICD-10-CM | POA: Diagnosis not present

## 2016-12-02 DIAGNOSIS — E782 Mixed hyperlipidemia: Secondary | ICD-10-CM | POA: Diagnosis not present

## 2016-12-09 ENCOUNTER — Other Ambulatory Visit: Payer: Self-pay

## 2016-12-09 ENCOUNTER — Encounter: Payer: Self-pay | Admitting: Gastroenterology

## 2016-12-09 ENCOUNTER — Telehealth: Payer: Self-pay

## 2016-12-09 ENCOUNTER — Ambulatory Visit (INDEPENDENT_AMBULATORY_CARE_PROVIDER_SITE_OTHER): Payer: Medicare HMO | Admitting: Gastroenterology

## 2016-12-09 ENCOUNTER — Other Ambulatory Visit: Payer: Self-pay | Admitting: Gastroenterology

## 2016-12-09 VITALS — BP 117/71 | HR 86 | Temp 97.2°F | Ht 74.0 in | Wt 169.2 lb

## 2016-12-09 DIAGNOSIS — D649 Anemia, unspecified: Secondary | ICD-10-CM

## 2016-12-09 DIAGNOSIS — K2270 Barrett's esophagus without dysplasia: Secondary | ICD-10-CM

## 2016-12-09 DIAGNOSIS — F5000 Anorexia nervosa, unspecified: Secondary | ICD-10-CM

## 2016-12-09 DIAGNOSIS — R630 Anorexia: Secondary | ICD-10-CM | POA: Diagnosis not present

## 2016-12-09 DIAGNOSIS — R634 Abnormal weight loss: Secondary | ICD-10-CM

## 2016-12-09 NOTE — Telephone Encounter (Signed)
Called and informed pt of pre-op appt 12/27/16 at 10:00am. Letter also mailed.

## 2016-12-09 NOTE — Progress Notes (Signed)
Was plan to recheck his CBC along with iron/TIBC, ferritin in 3 months. We will determine whether or not he needs to remain on iron at that point.  Plan for return office visit in 6 months.

## 2016-12-09 NOTE — Progress Notes (Signed)
Primary Care Physician: Lavella Lemons, [place]  Primary Gastroenterologist:  Garfield Cornea, MD   Chief Complaint  Patient presents with  . Constipation  . Anemia    HPI: Sean Leonard is a 71 y.o. male here for follow-up. Last seen in May. Admitted in the hospital back in March for lower GI bleeding. Colonoscopy was completed on April 2 which noted diverticula and cecal polyp status post polypectomy. The patient had continued bleeding and a tagged bleeding scan was completed found no source of bleeding. Surgery recommended repeat colonoscopy and subtotal colectomy felt to be last resort. The patient was given a total of 12 units of blood during the admission. A repeat colonoscopy completed 06/28/2016 found ascending colon ulcers status post clipping which appeared to resolve bleeding.  Due for surveillance colonoscopy 5-10 years for simple adenoma.  Patient with history of Barrett's esophagus, due surveillance EGD at any time.  Patient denies any further rectal bleeding. His stools are dark on iron. Complains of feeling cold all the time. States his typical weight is around 180 pounds Blood count back to normal. Thyroid ok. 180 pounds and keeps losing. Down to 169 now. Was 176 back in May. Appetite comes and goes but somewhat poor. No dysphagia. No vomiting. No heartburn. Drinking Ensure. Stool softeners three times per week. No brbpr. Stool dark on iron. No abdominal pain.   Recent labs by PCP obtained while patient was in the office. TSH 0.3-1 slightly low, glucose 84, BUN 9, creatinine 0.84, albumin 4, white blood cell count 5900, hemoglobin 13 low end of normal, platelets 271,000.   Current Outpatient Prescriptions  Medication Sig Dispense Refill  . ferrous sulfate 325 (65 FE) MG tablet Take 1 tablet (325 mg total) by mouth 2 (two) times daily with a meal. 60 tablet 2  . fish oil-omega-3 fatty acids 1000 MG capsule Take 2 g by mouth daily.    Marland Kitchen levothyroxine (SYNTHROID,  LEVOTHROID) 112 MCG tablet Take 112 mcg by mouth every morning.    . Multiple Vitamin (MULTIVITAMIN WITH MINERALS) TABS tablet Take 1 tablet by mouth daily.    Marland Kitchen omeprazole (PRILOSEC) 20 MG capsule TAKE 1 CAPSULE EVERY DAY 90 capsule 0  . pravastatin (PRAVACHOL) 20 MG tablet Take 20 mg by mouth at bedtime.     No current facility-administered medications for this visit.     Allergies as of 12/09/2016  . (No Known Allergies)   Past Medical History:  Diagnosis Date  . DDD (degenerative disc disease), lumbar   . Diverticula of colon   . Iliac artery aneurysm, bilateral (HCC)    with significant stenosis of right internal iliac artery  . Peptic ulcer   . Prostatitis   . Thyroid disease    Past Surgical History:  Procedure Laterality Date  . COLONOSCOPY  April 2005   Dr. Irving Shows. Small internal hemorrhoids. Multiple small scattered diverticula in the cecum and descending colon.  . COLONOSCOPY N/A 01/18/2014   Procedure: COLONOSCOPY;  Surgeon: Daneil Dolin, MD;  [facility];  Service: Endoscopy;  Laterality: N/A;  1100  . COLONOSCOPY N/A 06/23/2016   Procedure: COLONOSCOPY;  Surgeon: Rogene Houston, MD;  [facility];  Service: Endoscopy;  Laterality: N/A;  . COLONOSCOPY WITH ESOPHAGOGASTRODUODENOSCOPY (EGD) N/A 12/16/2012   Dr. Gala Romney: rectal and colonic polyps with inadequate preparation, tubular adenoma. EGD with biopsy proven short segment Barrett's esophagus, hiatal hernia, mild chronic inactive gastritis   . COLONOSCOPY WITH  PROPOFOL N/A 06/24/2016   Procedure: COLONOSCOPY WITH PROPOFOL;  Surgeon: Danie Binder, MD;  [facility];  Service: Endoscopy;  Laterality: N/A;  . COLONOSCOPY WITH PROPOFOL N/A 06/28/2016   Procedure: COLONOSCOPY WITH PROPOFOL;  Surgeon: Mauri Pole, MD;  Location: [facility];  Service: Endoscopy;  Laterality: N/A;  . ESOPHAGOGASTRODUODENOSCOPY  July 2005   Dr. Irving Shows grade 2 esophagitis at the GE junction.  Acute gastritis. Multiple acute active, deep, irregular, friable ulcers in the antrum and body of the stomach. 2 active, deep, friable, irregular ulcers in the duodenum. Pathology not available.  . ESOPHAGOGASTRODUODENOSCOPY N/A 01/18/2014   Procedure: ESOPHAGOGASTRODUODENOSCOPY (EGD);  Surgeon: Daneil Dolin, MD;  [facility];  Service: Endoscopy;  Laterality: N/A;  . IR ANGIOGRAM VISCERAL SELECTIVE  06/26/2016  . IR ANGIOGRAM VISCERAL SELECTIVE  06/26/2016  . IR ANGIOGRAM VISCERAL SELECTIVE  06/26/2016  . IR US GUIDE VASC ACCESS RIGHT  06/26/2016  . POLYPECTOMY  06/24/2016   Procedure: POLYPECTOMY;  Surgeon: Danie Binder, MD;  [facility];  Service: Endoscopy;;  cecal  . THYROIDECTOMY    . TONSILLECTOMY     Family History  Problem Relation Age of Onset  . Hypertension Mother   . Heart disease Mother        before age 55  . Heart disease Father        before age 56  . Hyperlipidemia Sister   . Cancer Sister   . Colon cancer Neg Hx   . Liver disease Neg Hx    Social History  Substance Use Topics  . Smoking status: Former Smoker    Years: 25.00    Types: Cigars    Quit date: 11/27/2006  . Smokeless tobacco: Never Used     Comment: Quit in 2009  . Alcohol use No     Comment: former. weekend drinker (liqour) before, May 2015 last time he drank during a Therapist, nutritional.      ROS:  General: Negative for anorexia, weight loss, fever, chills, fatigue, weakness. ENT: Negative for hoarseness, difficulty swallowing , nasal congestion. CV: Negative for chest pain, angina, palpitations, dyspnea on exertion, peripheral edema.  Respiratory: Negative for dyspnea at rest, dyspnea on exertion, cough, sputum, wheezing.  GI: See history of present illness. GU:  Negative for dysuria, hematuria, urinary incontinence, urinary frequency, nocturnal urination.  Endo: Negative for unusual weight change.    Physical Examination:   BP 117/71   Pulse 86   Temp (!) 97.2 F (36.2 C)  (Oral)   Ht 6\' 2"  (1.88 m)   Wt 169 lb 3.2 oz (76.7 kg)   BMI 21.72 kg/m   General: Well-nourished, well-developed in no acute distress.  Eyes: No icterus. Mouth: Oropharyngeal mucosa moist and pink , no lesions erythema or exudate. Lungs: Clear to auscultation bilaterally.  Heart: Regular rate and rhythm, no murmurs rubs or gallops.  Abdomen: Bowel sounds are normal, nontender, nondistended, no hepatosplenomegaly or masses, no abdominal bruits or hernia , no rebound or guarding.   Extremities: No lower extremity edema. No clubbing or deformities. Neuro: Alert and oriented x 4   Skin: Warm and dry, no jaundice.   Psych: Alert and cooperative, normal mood and affect.  Labs:  See above Imaging Studies: No results found.

## 2016-12-09 NOTE — Progress Notes (Signed)
CC'D TO PCP °

## 2016-12-09 NOTE — Patient Instructions (Signed)
1. We will get a copy of your labs for review. 2. I'll let you know whether you need to continue iron based on labs. 3. Upper endoscopy as scheduled. Please see separate instructions. 4. Upper endoscopy done for history of Barrett's esophagus as well as diminished appetite and weight loss.

## 2016-12-09 NOTE — Assessment & Plan Note (Signed)
71 year old gentleman with history of GI bleed earlier this year as outlined above, no evidence of recurrent lower GI bleeding. Hemoglobin low at 13. Advised to continue iron for now. He presents with drop in weight, some loss of appetite. He has a history of Barrett's esophagus and is due for surveillance EGD at this time.  Discussed with patient, plan for EGD for Barrett's surveillance as well as poor appetite/weight loss. EGD with deep sedation as before.  I have discussed the risks, alternatives, benefits with regards to but not limited to the risk of reaction to medication, bleeding, infection, perforation and the patient is agreeable to proceed. Written consent to be obtained.

## 2016-12-09 NOTE — Progress Notes (Signed)
Lab orders on file. 

## 2016-12-10 ENCOUNTER — Ambulatory Visit (HOSPITAL_COMMUNITY)
Admission: RE | Admit: 2016-12-10 | Discharge: 2016-12-10 | Disposition: A | Discharge status: Home / Self Care | Payer: Medicare HMO | Source: Ambulatory Visit | Attending: Family | Admitting: Family

## 2016-12-10 ENCOUNTER — Encounter: Payer: Self-pay | Admitting: Family

## 2016-12-10 ENCOUNTER — Ambulatory Visit (INDEPENDENT_AMBULATORY_CARE_PROVIDER_SITE_OTHER): Payer: Medicare HMO | Admitting: Family

## 2016-12-10 VITALS — BP 114/73 | HR 86 | Temp 97.3°F | Ht 74.0 in | Wt 168.0 lb

## 2016-12-10 DIAGNOSIS — M79605 Pain in left leg: Secondary | ICD-10-CM | POA: Insufficient documentation

## 2016-12-10 DIAGNOSIS — R0989 Other specified symptoms and signs involving the circulatory and respiratory systems: Secondary | ICD-10-CM

## 2016-12-10 DIAGNOSIS — M79604 Pain in right leg: Secondary | ICD-10-CM | POA: Diagnosis not present

## 2016-12-10 DIAGNOSIS — R9439 Abnormal result of other cardiovascular function study: Secondary | ICD-10-CM | POA: Insufficient documentation

## 2016-12-10 DIAGNOSIS — I779 Disorder of arteries and arterioles, unspecified: Secondary | ICD-10-CM

## 2016-12-10 DIAGNOSIS — I723 Aneurysm of iliac artery: Secondary | ICD-10-CM | POA: Diagnosis not present

## 2016-12-10 NOTE — Patient Instructions (Signed)

## 2016-12-10 NOTE — Progress Notes (Signed)
[facility]   CC: Intermittent bilateral hip and leg pain x 1 month   History of Present Illness  Sean Leonard is a 71 y.o. ([birth year]) male with known bilateral internal iliac artery aneurysms. This was an incidental finding from a CT scan of his abdomen and pelvis for evaluation of abdominal discomfort. He also had finding of enlarged prostate and is having evaluation of this as well. Fortunately the abdominal discomfort that the indication for the CT scan has resolved.  He has no history of aneurysms and no family history of aneurysm.  He returns today with c/o bilateral intermitent leg pain for about a month. The pain starts in both calves, and radiates proximally to both hips; this happens with walking, supine, standing. Pain is relieved by bringing one knee up to his chest.   Dr. Donnetta Hutching last evaluated pt on 02-07-15. At that time lower extremity arterial duplex revealed biphasic posterior tibial pulses bilaterally. ABI was normal at 1.0 on the left and slightly diminished at 0.9 on the right. CT scan from August 2016 was reviewed. This was compared to study from 2 years prior which showed slight several millimeter increase in size of his left internal iliac and bilateral common iliac artery aneurysms. These were all in the 2 cm range.  Dr. Donnetta Hutching discussed this at length with patient. Explained that none of these are at the point where we would recommend any consideration of intervention. He is extremely low risk of rupture from the small aneurysms. Recommended repeat CT scan in 2 years. Dr. Donnetta Hutching did not feel his right leg symptoms were related to arterial insufficiency and suspected that these are related to radicular lumbar symptoms. He was to discuss this with his primary care physician if it persisted.   He is having GI bleeding, has had several transfusions, and is having GI work up.   Pt denies any known history of stroke or TIA.  Pt states he  cannot take ASA, "I had 15 ulcers on my stomach".   Pt denies shortness of breath or cough.  Pt Diabetic: No Pt smoker: former smoker, quit in 2008    Past Medical History:  Diagnosis Date  . DDD (degenerative disc disease), lumbar   . Diverticula of colon   . Iliac artery aneurysm, bilateral (HCC)    with significant stenosis of right internal iliac artery  . Peptic ulcer   . Prostatitis   . Thyroid disease    Past Surgical History:  Procedure Laterality Date  . COLONOSCOPY  April 2005   Dr. Irving Shows. Small internal hemorrhoids. Multiple small scattered diverticula in the cecum and descending colon.  . COLONOSCOPY N/A 01/18/2014   Procedure: COLONOSCOPY;  Surgeon: Daneil Dolin, MD;  [facility];  Service: Endoscopy;  Laterality: N/A;  1100  . COLONOSCOPY N/A 06/23/2016   Procedure: COLONOSCOPY;  Surgeon: Rogene Houston, MD;  [facility];  Service: Endoscopy;  Laterality: N/A;  . COLONOSCOPY WITH ESOPHAGOGASTRODUODENOSCOPY (EGD) N/A 12/16/2012   Dr. Gala Romney: rectal and colonic polyps with inadequate preparation, tubular adenoma. EGD with biopsy proven short segment Barrett's esophagus, hiatal hernia, mild chronic inactive gastritis   . COLONOSCOPY WITH PROPOFOL N/A 06/24/2016   Procedure: COLONOSCOPY WITH PROPOFOL;  Surgeon: Danie Binder, MD;  [facility];  Service: Endoscopy;  Laterality: N/A;  . COLONOSCOPY WITH PROPOFOL N/A 06/28/2016   Procedure: COLONOSCOPY WITH PROPOFOL;  Surgeon: Mauri Pole, MD;  Location: [facility];  Service: Endoscopy;  Laterality: N/A;  . ESOPHAGOGASTRODUODENOSCOPY  July 2005   Dr. Irving Shows grade 2 esophagitis at the GE junction. Acute gastritis. Multiple acute active, deep, irregular, friable ulcers in the antrum and body of the stomach. 2 active, deep, friable, irregular ulcers in the duodenum. Pathology not available.  . ESOPHAGOGASTRODUODENOSCOPY N/A 01/18/2014   Procedure:  ESOPHAGOGASTRODUODENOSCOPY (EGD);  Surgeon: Daneil Dolin, MD;  [facility];  Service: Endoscopy;  Laterality: N/A;  . IR ANGIOGRAM VISCERAL SELECTIVE  06/26/2016  . IR ANGIOGRAM VISCERAL SELECTIVE  06/26/2016  . IR ANGIOGRAM VISCERAL SELECTIVE  06/26/2016  . IR US GUIDE VASC ACCESS RIGHT  06/26/2016  . POLYPECTOMY  06/24/2016   Procedure: POLYPECTOMY;  Surgeon: Danie Binder, MD;  [facility];  Service: Endoscopy;;  cecal  . THYROIDECTOMY    . TONSILLECTOMY     Social History Social History   Social History  . Marital status: Married    Spouse name: N/A  . Number of children: N/A  . Years of education: N/A   Occupational History  . Not on file.   Social History Main Topics  . Smoking status: Former Smoker    Years: 25.00    Types: Cigars    Quit date: 11/27/2006  . Smokeless tobacco: Never Used     Comment: Quit in 2009  . Alcohol use No     Comment: former. weekend drinker (liqour) before, May 2015 last time he drank during a Therapist, nutritional.   . Drug use: No  . Sexual activity: Not on file   Other Topics Concern  . Not on file   Social History Narrative   1 deceased child. 1 living   Family History Family History  Problem Relation Age of Onset  . Hypertension Mother   . Heart disease Mother        before age 31  . Heart disease Father        before age 23  . Hyperlipidemia Sister   . Cancer Sister   . Colon cancer Neg Hx   . Liver disease Neg Hx     Current Outpatient Prescriptions on File Prior to Visit  Medication Sig Dispense Refill  . ferrous sulfate 325 (65 FE) MG tablet Take 1 tablet (325 mg total) by mouth 2 (two) times daily with a meal. 60 tablet 2  . fish oil-omega-3 fatty acids 1000 MG capsule Take 2 g by mouth daily.    Marland Kitchen levothyroxine (SYNTHROID, LEVOTHROID) 112 MCG tablet Take 112 mcg by mouth every morning.    . Multiple Vitamin (MULTIVITAMIN WITH MINERALS) TABS tablet Take 1 tablet by mouth daily.    Marland Kitchen omeprazole (PRILOSEC) 20  MG capsule TAKE 1 CAPSULE EVERY DAY 90 capsule 0  . pravastatin (PRAVACHOL) 20 MG tablet Take 20 mg by mouth at bedtime.     No current facility-administered medications on file prior to visit.    No Known Allergies  ROS: See HPI for pertinent positives and negatives.  Physical Examination  Vitals:   12/10/16 0938  BP: 114/73  Pulse: 86  Temp: (!) 97.3 F (36.3 C)  TempSrc: Oral  SpO2: 96%  Weight: 168 lb (76.2 kg)  Height: 6\' 2"  (1.88 m)   Body mass index is 21.57 kg/m.  General: A&O x 3, WD, slender male.  Pulmonary: Sym exp, respirations are non labored, good air movement in all fields, no rales, rhonchi, or wheezes.   Cardiac: RRR, Nl S1, S2, no detected  murmur.   Carotid Bruits Right Left   Negative Negative   Aorta is not palpable Radial pulses are 2+ palpable and =                          VASCULAR EXAM:                                                                                                                                           LE Pulses Right Left       FEMORAL  1+ palpable  2+ palpable        POPLITEAL  1+ palpable   1+ palpable       POSTERIOR TIBIAL  not palpable, + Doppler signal   not palpable, + Doppler signal        DORSALIS PEDIS      ANTERIOR TIBIAL not palpable, no Doppler signal   not palpable, + Doppler signal      Gastrointestinal: soft, NTND, -G/R, - HSM, - masses palpated, - CVAT B.  Musculoskeletal: M/S 5/5 throughout, Extremities without ischemic changes.  Neurologic: CN 2-12 intact, Pain and light touch intact in extremities are intact, Motor exam as listed above.    DATA  ABI (Date: 12/10/2016):  R:   ABI: 0.75 (no previous for comparison),   PT: mono  DP: mono  TBI:  0.54  L:   ABI: 1.06 (no previous),   PT: mono  DP: mono  TBI: 0.54 Right ABI suggests moderate disease. Left ABI of 100% does not correlate with monophasic waveforms.    CTA abd/pelvis (06-27-16) for evaluation  of abdominal pain, while hospitalized at [facility]: Aorta: Mild tortuosity and atherosclerotic changes of the abdominal aorta. Infrarenal aorta maximal AP diameter 2.7 cm and transverse 2.5 cm. No occlusive process, acute dissection, or retroperitoneal hemorrhage.  Celiac axis: Widely patent origin and branches. No acute vascular finding or bleeding.  Superior mesenteric: Widely patent origin and branches. Replaced right hepatic artery noted. No active contrast extravasation or bleeding demonstrated.  Left renal:           Widely patent  Right renal:          Atherosclerotic changes but widely patent  Inferior mesenteric: Atherosclerotic origin but remains patent. No active GI bleeding within the IMA vascular distribution.  Left iliac: Atherosclerotic changes and mild ectasia of the left common iliac artery. Left common iliac diameter is 14 mm. 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 Left common, internal and external iliac arteries remain patent. No occlusive process.  Right iliac: Atherosclerotic changes and aneurysmal dilatation of the right common iliac artery, maximal diameter 2.2 cm, image 88. Aneurysm dilatation also noted of the right internal iliac artery measuring 16 mm, image 100. Right iliac vessels remain patent without occlusive process.  Visualized common  femoral, proximal profunda femoral, and proximal superficial femoral arteries are also patent.  Degenerative changes noted of the spine diffusely but most pronounced from L2-S1.     Medical Decision Making  The patient is a 71 y.o. male who presents with 1 month hx of intermittent bilateral calf pain that radiates proximally to both hips; this occurs su[ine, sitting, or walking, relieved by bringing a knee up to his chest.   I discussed with Dr. Donnetta Hutching pt HPI, sx's c/w DDD, Degenerative changes noted of the spine diffusely but most pronounced  from L2-S1 on CTA abd/pelvis from April 2018, leg pain relieved by bringing knee to chest, and + Doppler signals in feet. 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 Pt is requesting spine specialist evaluation in [place] or [place].   Consideration for repair of common iliac artery aneurysm would be made when the size is 3.5 cm, growth > 1 cm/yr, and symptomatic status.         I emphasized the importance of maximal medical management including strict control of blood pressure, blood glucose, and lipid levels, antiplatelet agents, obtaining regular exercise, and continued cessation of smoking.   The patient was advised to call 911 should the patient experience sudden onset abdominal or back pain.   Thank you for allowing [place] to participate in this patient's care.  Clemon Chambers, RN, MSN, FNP-C [facility] Office: [phone]  Clinic Physician: Early  12/10/2016, 10:02 AM

## 2016-12-11 ENCOUNTER — Ambulatory Visit (INDEPENDENT_AMBULATORY_CARE_PROVIDER_SITE_OTHER): Payer: Medicare HMO | Admitting: Orthopaedic Surgery

## 2016-12-18 ENCOUNTER — Telehealth: Payer: Self-pay | Admitting: Vascular Surgery

## 2016-12-18 NOTE — Telephone Encounter (Signed)
Tried to Call [facility] to see if the pts referral was received and if an appt was made, no appt at this time so I left a message with the NP referral coordinator. Please send her to me if she calls, BG

## 2016-12-24 NOTE — Patient Instructions (Signed)
Sean Leonard  12/24/2016     @PREFPERIOPPHARMACY @   Your procedure is scheduled on  01/02/2017   Report to [facility] at  745  A.M.  Call this number if you have problems the morning of surgery:  [phone]   Remember:  Do not eat food or drink liquids after midnight.  Take these medicines the morning of surgery with A SIP OF WATER  Levothyroxine, prilosec.   Do not wear jewelry, make-up or nail polish.  Do not wear lotions, powders, or perfumes, or deoderant.  Do not shave 48 hours prior to surgery.  Men may shave face and neck.  Do not bring valuables to the hospital.  [facility] is not responsible for any belongings or valuables.  Contacts, dentures or bridgework may not be worn into surgery.  Leave your suitcase in the car.  After surgery it may be brought to your room.  For patients admitted to the hospital, discharge time will be determined by your treatment team.  Patients discharged the day of surgery will not be allowed to drive home.   Name and phone number of your driver:   family Special instructions:  Follow the diet instructions given to you by Dr Roseanne Kaufman office.  Please read over the following fact sheets that you were given. Anesthesia Post-op Instructions and Care and Recovery After Surgery       Esophagogastroduodenoscopy Esophagogastroduodenoscopy (EGD) is a procedure to examine the lining of the esophagus, stomach, and first part of the small intestine (duodenum). This procedure is done to check for problems such as inflammation, bleeding, ulcers, or growths. During this procedure, a long, flexible, lighted tube with a camera attached (endoscope) is inserted down the throat. Tell a health care provider about:  Any allergies you have.  All medicines you are taking, including vitamins, herbs, eye drops, creams, and over-the-counter medicines.  Any problems you or family members have had with anesthetic medicines.  Any blood  disorders you have.  Any surgeries you have had.  Any medical conditions you have.  Whether you are pregnant or may be pregnant. What are the risks? Generally, this is a safe procedure. However, problems may occur, including:  Infection.  Bleeding.  A tear (perforation) in the esophagus, stomach, or duodenum.  Trouble breathing.  Excessive sweating.  Spasms of the larynx.  A slowed heartbeat.  Low blood pressure.  What happens before the procedure?  Follow instructions from your health care provider about eating or drinking restrictions.  Ask your health care provider about: ? Changing or stopping your regular medicines. This is especially important if you are taking diabetes medicines or blood thinners. ? Taking medicines such as aspirin and ibuprofen. These medicines can thin your blood. Do not take these medicines before your procedure if your health care provider instructs you not to.  Plan to have someone take you home after the procedure.  If you wear dentures, be ready to remove them before the procedure. What happens during the procedure?  To reduce your risk of infection, your health care team will wash or sanitize their hands.  An IV tube will be put in a vein in your hand or arm. You will get medicines and fluids through this tube.  You will be given one or more of the following: ? A medicine to help you relax (sedative). ? A medicine to numb the area (local anesthetic). This medicine may be sprayed into  your throat. It will make you feel more comfortable and keep you from gagging or coughing during the procedure. ? A medicine for pain.  A mouth guard may be placed in your mouth to protect your teeth and to keep you from biting on the endoscope.  You will be asked to lie on your left side.  The endoscope will be lowered down your throat into your esophagus, stomach, and duodenum.  Air will be put into the endoscope. This will help your health care  provider see better.  The lining of your esophagus, stomach, and duodenum will be examined.  Your health care provider may: ? Take a tissue sample so it can be looked at in a lab (biopsy). ? Remove growths. ? Remove objects (foreign bodies) that are stuck. ? Treat any bleeding with medicines or other devices that stop tissue from bleeding. ? Widen (dilate) or stretch narrowed areas of your esophagus and stomach.  The endoscope will be taken out. The procedure may vary among health care providers and hospitals. What happens after the procedure?  Your blood pressure, heart rate, breathing rate, and blood oxygen level will be monitored often until the medicines you were given have worn off.  Do not eat or drink anything until the numbing medicine has worn off and your gag reflex has returned. This information is not intended to replace advice given to you by your health care provider. Make sure you discuss any questions you have with your health care provider. Document Released: 07/12/2004 Document Revised: 08/17/2015 Document Reviewed: 02/02/2015 Elsevier Interactive Patient Education  2018 [employer]. Esophagogastroduodenoscopy, Care After Refer to this sheet in the next few weeks. These instructions provide you with information about caring for yourself after your procedure. Your health care provider may also give you more specific instructions. Your treatment has been planned according to current medical practices, but problems sometimes occur. Call your health care provider if you have any problems or questions after your procedure. What can I expect after the procedure? After the procedure, it is common to have:  A sore throat.  Nausea.  Bloating.  Dizziness.  Fatigue.  Follow these instructions at home:  Do not eat or drink anything until the numbing medicine (local anesthetic) has worn off and your gag reflex has returned. You will know that the local anesthetic has worn  off when you can swallow comfortably.  Do not drive for 24 hours if you received a medicine to help you relax (sedative).  If your health care provider took a tissue sample for testing during the procedure, make sure to get your test results. This is your responsibility. Ask your health care provider or the department performing the test when your results will be ready.  Keep all follow-up visits as told by your health care provider. This is important. Contact a health care provider if:  You cannot stop coughing.  You are not urinating.  You are urinating less than usual. Get help right away if:  You have trouble swallowing.  You cannot eat or drink.  You have throat or chest pain that gets worse.  You are dizzy or light-headed.  You faint.  You have nausea or vomiting.  You have chills.  You have a fever.  You have severe abdominal pain.  You have black, tarry, or bloody stools. This information is not intended to replace advice given to you by your health care provider. Make sure you discuss any questions you have with your health care  provider. Document Released: 02/26/2012 Document Revised: 08/17/2015 Document Reviewed: 02/02/2015 Elsevier Interactive Patient Education  2018 [place] Anesthesia is a term that refers to techniques, procedures, and medicines that help a person stay safe and comfortable during a medical procedure. Monitored anesthesia care, or sedation, is one type of anesthesia. Your anesthesia specialist may recommend sedation if you will be having a procedure that does not require you to be unconscious, such as:  Cataract surgery.  A dental procedure.  A biopsy.  A colonoscopy.  During the procedure, you may receive a medicine to help you relax (sedative). There are three levels of sedation:  Mild sedation. At this level, you may feel awake and relaxed. You will be able to follow directions.  Moderate  sedation. At this level, you will be sleepy. You may not remember the procedure.  Deep sedation. At this level, you will be asleep. You will not remember the procedure.  The more medicine you are given, the deeper your level of sedation will be. Depending on how you respond to the procedure, the anesthesia specialist may change your level of sedation or the type of anesthesia to fit your needs. An anesthesia specialist will monitor you closely during the procedure. Let your health care provider know about:  Any allergies you have.  All medicines you are taking, including vitamins, herbs, eye drops, creams, and over-the-counter medicines.  Any use of steroids (by mouth or as a cream).  Any problems you or family members have had with sedatives and anesthetic medicines.  Any blood disorders you have.  Any surgeries you have had.  Any medical conditions you have, such as sleep apnea.  Whether you are pregnant or may be pregnant.  Any use of cigarettes, alcohol, or street drugs. What are the risks? Generally, this is a safe procedure. However, problems may occur, including:  Getting too much medicine (oversedation).  Nausea.  Allergic reaction to medicines.  Trouble breathing. If this happens, a breathing tube may be used to help with breathing. It will be removed when you are awake and breathing on your own.  Heart trouble.  Lung trouble.  Before the procedure Staying hydrated Follow instructions from your health care provider about hydration, which may include:  Up to 2 hours before the procedure - you may continue to drink clear liquids, such as water, clear fruit juice, black coffee, and plain tea.  Eating and drinking restrictions Follow instructions from your health care provider about eating and drinking, which may include:  8 hours before the procedure - stop eating heavy meals or foods such as meat, fried foods, or fatty foods.  6 hours before the procedure -  stop eating light meals or foods, such as toast or cereal.  6 hours before the procedure - stop drinking milk or drinks that contain milk.  2 hours before the procedure - stop drinking clear liquids.  Medicines Ask your health care provider about:  Changing or stopping your regular medicines. This is especially important if you are taking diabetes medicines or blood thinners.  Taking medicines such as aspirin and ibuprofen. These medicines can thin your blood. Do not take these medicines before your procedure if your health care provider instructs you not to.  Tests and exams  You will have a physical exam.  You may have blood tests done to show: ? How well your kidneys and liver are working. ? How well your blood can clot.  General instructions  Plan to  have someone take you home from the hospital or clinic.  If you will be going home right after the procedure, plan to have someone with you for 24 hours.  What happens during the procedure?  Your blood pressure, heart rate, breathing, level of pain and overall condition will be monitored.  An IV tube will be inserted into one of your veins.  Your anesthesia specialist will give you medicines as needed to keep you comfortable during the procedure. This may mean changing the level of sedation.  The procedure will be performed. After the procedure  Your blood pressure, heart rate, breathing rate, and blood oxygen level will be monitored until the medicines you were given have worn off.  Do not drive for 24 hours if you received a sedative.  You may: ? Feel sleepy, clumsy, or nauseous. ? Feel forgetful about what happened after the procedure. ? Have a sore throat if you had a breathing tube during the procedure. ? Vomit. This information is not intended to replace advice given to you by your health care provider. Make sure you discuss any questions you have with your health care provider. Document Released: 12/05/2004  Document Revised: 08/18/2015 Document Reviewed: 07/02/2015 Elsevier Interactive Patient Education  2018 [place], Care After These instructions provide you with information about caring for yourself after your procedure. Your health care provider may also give you more specific instructions. Your treatment has been planned according to current medical practices, but problems sometimes occur. Call your health care provider if you have any problems or questions after your procedure. What can I expect after the procedure? After your procedure, it is common to:  Feel sleepy for several hours.  Feel clumsy and have poor balance for several hours.  Feel forgetful about what happened after the procedure.  Have poor judgment for several hours.  Feel nauseous or vomit.  Have a sore throat if you had a breathing tube during the procedure.  Follow these instructions at home: For at least 24 hours after the procedure:   Do not: ? Participate in activities in which you could fall or become injured. ? Drive. ? Use heavy machinery. ? Drink alcohol. ? Take sleeping pills or medicines that cause drowsiness. ? Make important decisions or sign legal documents. ? Take care of children on your own.  Rest. Eating and drinking  Follow the diet that is recommended by your health care provider.  If you vomit, drink water, juice, or soup when you can drink without vomiting.  Make sure you have little or no nausea before eating solid foods. General instructions  Have a responsible adult stay with you until you are awake and alert.  Take over-the-counter and prescription medicines only as told by your health care provider.  If you smoke, do not smoke without supervision.  Keep all follow-up visits as told by your health care provider. This is important. Contact a health care provider if:  You keep feeling nauseous or you keep vomiting.  You feel  light-headed.  You develop a rash.  You have a fever. Get help right away if:  You have trouble breathing. This information is not intended to replace advice given to you by your health care provider. Make sure you discuss any questions you have with your health care provider. Document Released: 07/02/2015 Document Revised: 11/01/2015 Document Reviewed: 07/02/2015 Elsevier Interactive Patient Education  [employer].

## 2016-12-25 DIAGNOSIS — R030 Elevated blood-pressure reading, without diagnosis of hypertension: Secondary | ICD-10-CM | POA: Diagnosis not present

## 2016-12-25 DIAGNOSIS — M5416 Radiculopathy, lumbar region: Secondary | ICD-10-CM | POA: Diagnosis not present

## 2016-12-26 ENCOUNTER — Encounter: Payer: Self-pay | Admitting: Internal Medicine

## 2016-12-27 ENCOUNTER — Encounter (HOSPITAL_COMMUNITY)
Admission: RE | Admit: 2016-12-27 | Discharge: 2016-12-27 | Disposition: A | Discharge status: Home / Self Care | Payer: Medicare HMO | Source: Ambulatory Visit | Attending: Internal Medicine | Admitting: Internal Medicine

## 2016-12-27 ENCOUNTER — Encounter (HOSPITAL_COMMUNITY): Payer: Self-pay

## 2016-12-27 DIAGNOSIS — I728 Aneurysm of other specified arteries: Secondary | ICD-10-CM | POA: Insufficient documentation

## 2016-12-27 DIAGNOSIS — K279 Peptic ulcer, site unspecified, unspecified as acute or chronic, without hemorrhage or perforation: Secondary | ICD-10-CM | POA: Diagnosis not present

## 2016-12-27 DIAGNOSIS — Z79899 Other long term (current) drug therapy: Secondary | ICD-10-CM | POA: Diagnosis not present

## 2016-12-27 DIAGNOSIS — Z87891 Personal history of nicotine dependence: Secondary | ICD-10-CM | POA: Diagnosis not present

## 2016-12-27 DIAGNOSIS — M5136 Other intervertebral disc degeneration, lumbar region: Secondary | ICD-10-CM | POA: Insufficient documentation

## 2016-12-27 DIAGNOSIS — K5900 Constipation, unspecified: Secondary | ICD-10-CM | POA: Diagnosis not present

## 2016-12-27 DIAGNOSIS — Z01812 Encounter for preprocedural laboratory examination: Secondary | ICD-10-CM | POA: Insufficient documentation

## 2016-12-27 DIAGNOSIS — Z8249 Family history of ischemic heart disease and other diseases of the circulatory system: Secondary | ICD-10-CM | POA: Diagnosis not present

## 2016-12-27 DIAGNOSIS — D649 Anemia, unspecified: Secondary | ICD-10-CM | POA: Diagnosis not present

## 2016-12-27 DIAGNOSIS — K2270 Barrett's esophagus without dysplasia: Secondary | ICD-10-CM | POA: Diagnosis not present

## 2016-12-27 DIAGNOSIS — Z9889 Other specified postprocedural states: Secondary | ICD-10-CM | POA: Insufficient documentation

## 2016-12-27 DIAGNOSIS — K5790 Diverticulosis of intestine, part unspecified, without perforation or abscess without bleeding: Secondary | ICD-10-CM | POA: Insufficient documentation

## 2016-12-27 HISTORY — DX: Thyrotoxicosis, unspecified without thyrotoxic crisis or storm: E05.90

## 2016-12-27 LAB — CBC WITH DIFFERENTIAL/PLATELET
BASOS ABS: 0 10*3/uL (ref 0.0–0.1)
BASOS PCT: 0 %
EOS ABS: 0.1 10*3/uL (ref 0.0–0.7)
EOS PCT: 2 %
HCT: 40.1 % (ref 39.0–52.0)
Hemoglobin: 12.8 g/dL — ABNORMAL LOW (ref 13.0–17.0)
LYMPHS PCT: 42 %
Lymphs Abs: 2.7 10*3/uL (ref 0.7–4.0)
MCH: 28.2 pg (ref 26.0–34.0)
MCHC: 31.9 g/dL (ref 30.0–36.0)
MCV: 88.3 fL (ref 78.0–100.0)
MONO ABS: 0.6 10*3/uL (ref 0.1–1.0)
Monocytes Relative: 9 %
Neutro Abs: 3.1 10*3/uL (ref 1.7–7.7)
Neutrophils Relative %: 47 %
PLATELETS: 264 10*3/uL (ref 150–400)
RBC: 4.54 MIL/uL (ref 4.22–5.81)
RDW: 17.6 % — AB (ref 11.5–15.5)
WBC: 6.5 10*3/uL (ref 4.0–10.5)

## 2016-12-27 LAB — BASIC METABOLIC PANEL
ANION GAP: 7 (ref 5–15)
BUN: 13 mg/dL (ref 6–20)
CALCIUM: 9.3 mg/dL (ref 8.9–10.3)
CO2: 28 mmol/L (ref 22–32)
Chloride: 102 mmol/L (ref 101–111)
Creatinine, Ser: 0.83 mg/dL (ref 0.61–1.24)
Glucose, Bld: 108 mg/dL — ABNORMAL HIGH (ref 65–99)
POTASSIUM: 4 mmol/L (ref 3.5–5.1)
SODIUM: 137 mmol/L (ref 135–145)

## 2017-01-02 ENCOUNTER — Encounter (HOSPITAL_COMMUNITY)
Admission: RE | Disposition: A | Discharge status: Home / Self Care | Payer: Self-pay | Source: Ambulatory Visit | Attending: Internal Medicine

## 2017-01-02 ENCOUNTER — Ambulatory Visit (HOSPITAL_COMMUNITY): Payer: Medicare HMO | Admitting: Anesthesiology

## 2017-01-02 ENCOUNTER — Encounter (HOSPITAL_COMMUNITY): Payer: Self-pay | Admitting: *Deleted

## 2017-01-02 ENCOUNTER — Ambulatory Visit (HOSPITAL_COMMUNITY)
Admission: RE | Admit: 2017-01-02 | Discharge: 2017-01-02 | Disposition: A | Discharge status: Home / Self Care | Payer: Medicare HMO | Source: Ambulatory Visit | Attending: Internal Medicine | Admitting: Internal Medicine

## 2017-01-02 DIAGNOSIS — M1990 Unspecified osteoarthritis, unspecified site: Secondary | ICD-10-CM | POA: Diagnosis not present

## 2017-01-02 DIAGNOSIS — K2950 Unspecified chronic gastritis without bleeding: Secondary | ICD-10-CM | POA: Diagnosis not present

## 2017-01-02 DIAGNOSIS — R630 Anorexia: Secondary | ICD-10-CM | POA: Insufficient documentation

## 2017-01-02 DIAGNOSIS — K228 Other specified diseases of esophagus: Secondary | ICD-10-CM

## 2017-01-02 DIAGNOSIS — Z8249 Family history of ischemic heart disease and other diseases of the circulatory system: Secondary | ICD-10-CM | POA: Diagnosis not present

## 2017-01-02 DIAGNOSIS — Z809 Family history of malignant neoplasm, unspecified: Secondary | ICD-10-CM | POA: Diagnosis not present

## 2017-01-02 DIAGNOSIS — K5900 Constipation, unspecified: Secondary | ICD-10-CM | POA: Diagnosis not present

## 2017-01-02 DIAGNOSIS — K2270 Barrett's esophagus without dysplasia: Secondary | ICD-10-CM

## 2017-01-02 DIAGNOSIS — K219 Gastro-esophageal reflux disease without esophagitis: Secondary | ICD-10-CM | POA: Diagnosis not present

## 2017-01-02 DIAGNOSIS — K449 Diaphragmatic hernia without obstruction or gangrene: Secondary | ICD-10-CM | POA: Diagnosis not present

## 2017-01-02 DIAGNOSIS — R000 Tachycardia, unspecified: Secondary | ICD-10-CM | POA: Insufficient documentation

## 2017-01-02 DIAGNOSIS — Z8711 Personal history of peptic ulcer disease: Secondary | ICD-10-CM | POA: Diagnosis not present

## 2017-01-02 DIAGNOSIS — I739 Peripheral vascular disease, unspecified: Secondary | ICD-10-CM | POA: Diagnosis not present

## 2017-01-02 DIAGNOSIS — K208 Other esophagitis: Secondary | ICD-10-CM | POA: Diagnosis not present

## 2017-01-02 DIAGNOSIS — D649 Anemia, unspecified: Secondary | ICD-10-CM | POA: Insufficient documentation

## 2017-01-02 DIAGNOSIS — Z79899 Other long term (current) drug therapy: Secondary | ICD-10-CM | POA: Diagnosis not present

## 2017-01-02 DIAGNOSIS — M5136 Other intervertebral disc degeneration, lumbar region: Secondary | ICD-10-CM | POA: Insufficient documentation

## 2017-01-02 DIAGNOSIS — F5000 Anorexia nervosa, unspecified: Secondary | ICD-10-CM

## 2017-01-02 DIAGNOSIS — Z87891 Personal history of nicotine dependence: Secondary | ICD-10-CM | POA: Insufficient documentation

## 2017-01-02 DIAGNOSIS — Z86010 Personal history of colonic polyps: Secondary | ICD-10-CM | POA: Insufficient documentation

## 2017-01-02 DIAGNOSIS — Z13810 Encounter for screening for upper gastrointestinal disorder: Secondary | ICD-10-CM

## 2017-01-02 DIAGNOSIS — K3189 Other diseases of stomach and duodenum: Secondary | ICD-10-CM

## 2017-01-02 DIAGNOSIS — E0590 Thyrotoxicosis, unspecified without thyrotoxic crisis or storm: Secondary | ICD-10-CM | POA: Diagnosis not present

## 2017-01-02 DIAGNOSIS — I723 Aneurysm of iliac artery: Secondary | ICD-10-CM | POA: Diagnosis not present

## 2017-01-02 DIAGNOSIS — E039 Hypothyroidism, unspecified: Secondary | ICD-10-CM | POA: Diagnosis not present

## 2017-01-02 DIAGNOSIS — E079 Disorder of thyroid, unspecified: Secondary | ICD-10-CM | POA: Diagnosis not present

## 2017-01-02 DIAGNOSIS — R634 Abnormal weight loss: Secondary | ICD-10-CM | POA: Diagnosis not present

## 2017-01-02 HISTORY — PX: ESOPHAGOGASTRODUODENOSCOPY (EGD) WITH PROPOFOL: SHX5813

## 2017-01-02 HISTORY — PX: BIOPSY: SHX5522

## 2017-01-02 SURGERY — ESOPHAGOGASTRODUODENOSCOPY (EGD) WITH PROPOFOL
Anesthesia: Monitor Anesthesia Care

## 2017-01-02 MED ORDER — PROPOFOL 10 MG/ML IV BOLUS
INTRAVENOUS | Status: AC
  Filled 2017-01-02: qty 40

## 2017-01-02 MED ORDER — FENTANYL CITRATE (PF) 100 MCG/2ML IJ SOLN
25.0000 ug | Freq: Once | INTRAMUSCULAR | Status: AC
  Administered 2017-01-02: 25 ug via INTRAVENOUS

## 2017-01-02 MED ORDER — LIDOCAINE HCL (PF) 1 % IJ SOLN
INTRAMUSCULAR | Status: AC
  Filled 2017-01-02: qty 5

## 2017-01-02 MED ORDER — CHLORHEXIDINE GLUCONATE CLOTH 2 % EX PADS: 6.0000 | MEDICATED_PAD | Freq: Once | CUTANEOUS | Status: DC

## 2017-01-02 MED ORDER — PROPOFOL 500 MG/50ML IV EMUL
INTRAVENOUS | Status: DC | PRN
  Administered 2017-01-02: 150 ug/kg/min via INTRAVENOUS

## 2017-01-02 MED ORDER — LACTATED RINGERS IV SOLN
INTRAVENOUS | Status: DC
  Administered 2017-01-02: 1000 mL via INTRAVENOUS
  Administered 2017-01-02: 09:00:00 via INTRAVENOUS

## 2017-01-02 MED ORDER — LIDOCAINE VISCOUS 2 % MT SOLN
3.0000 mL | OROMUCOSAL | Status: AC | PRN
  Administered 2017-01-02 (×2): 3 mL via OROMUCOSAL

## 2017-01-02 MED ORDER — FENTANYL CITRATE (PF) 100 MCG/2ML IJ SOLN
INTRAMUSCULAR | Status: AC
  Filled 2017-01-02: qty 2

## 2017-01-02 MED ORDER — LIDOCAINE VISCOUS 2 % MT SOLN
OROMUCOSAL | Status: AC
  Filled 2017-01-02: qty 15

## 2017-01-02 MED ORDER — STERILE WATER FOR IRRIGATION IR SOLN
Status: DC | PRN
  Administered 2017-01-02: 09:00:00

## 2017-01-02 MED ORDER — MIDAZOLAM HCL 2 MG/2ML IJ SOLN
1.0000 mg | INTRAMUSCULAR | Status: AC
  Administered 2017-01-02: 2 mg via INTRAVENOUS
  Filled 2017-01-02: qty 2

## 2017-01-02 NOTE — Discharge Instructions (Signed)
EGD Discharge instructions Please read the instructions outlined below and refer to this sheet in the next few weeks. These discharge instructions provide you with general information on caring for yourself after you leave the hospital. Your doctor may also give you specific instructions. While your treatment has been planned according to the most current medical practices available, unavoidable complications occasionally occur. If you have any problems or questions after discharge, please call your doctor. ACTIVITY  You may resume your regular activity but move at a slower pace for the next 24 hours.   Take frequent rest periods for the next 24 hours.   Walking will help expel (get rid of) the air and reduce the bloated feeling in your abdomen.   No driving for 24 hours (because of the anesthesia (medicine) used during the test).   You may shower.   Do not sign any important legal documents or operate any machinery for 24 hours (because of the anesthesia used during the test).  NUTRITION  Drink plenty of fluids.   You may resume your normal diet.   Begin with a light meal and progress to your normal diet.   Avoid alcoholic beverages for 24 hours or as instructed by your caregiver.  MEDICATIONS  You may resume your normal medications unless your caregiver tells you otherwise.  WHAT YOU CAN EXPECT TODAY  You may experience abdominal discomfort such as a feeling of fullness or gas pains.  FOLLOW-UP  Your doctor will discuss the results of your test with you.  SEEK IMMEDIATE MEDICAL ATTENTION IF ANY OF THE FOLLOWING OCCUR:  Excessive nausea (feeling sick to your stomach) and/or vomiting.   Severe abdominal pain and distention (swelling).   Trouble swallowing.   Temperature over 101 F (37.8 C).   Rectal bleeding or vomiting of blood.    Current information provided  Continue omeprazole 20 mg daily  Further recommendations to follow pending review of pathology  report

## 2017-01-02 NOTE — Anesthesia Postprocedure Evaluation (Signed)
Anesthesia Post Note  Patient: Sean Leonard  Procedure(s) Performed: ESOPHAGOGASTRODUODENOSCOPY (EGD) WITH PROPOFOL (N/A ) BIOPSY  Patient location during evaluation: PACU Anesthesia Type: MAC Level of consciousness: awake, oriented and patient cooperative Pain management: pain level controlled Respiratory status: respiratory function stable and patient connected to nasal cannula oxygen Cardiovascular status: stable Postop Assessment: no apparent nausea or vomiting Anesthetic complications: no     Last Vitals:  Vitals:   01/02/17 0830 01/02/17 0845  BP: 115/82 120/78  Resp: 15 16  Temp:    SpO2: 96% 93%    Last Pain:  Vitals:   01/02/17 0800  TempSrc: Oral                 Brandyn Lowrey A

## 2017-01-02 NOTE — Interval H&P Note (Signed)
History and Physical Interval Note:  01/02/2017 9:03 AM  Sean Leonard  has presented today for surgery, with the diagnosis of Barrett's, anorexia, weight loss  The various methods of treatment have been discussed with the patient and family. After consideration of risks, benefits and other options for treatment, the patient has consented to  Procedure(s) with comments: ESOPHAGOGASTRODUODENOSCOPY (EGD) WITH PROPOFOL (N/A) - 9:15am as a surgical intervention .  The patient's history has been reviewed, patient examined, no change in status, stable for surgery.  I have reviewed the patient's chart and labs.  Questions were answered to the patient's satisfaction.     Robert Rourk   No change. Patient here for surveillance EGD/Barrett's esophagus. Patient denies dysphagia.  The risks, benefits, limitations, alternatives and imponderables have been reviewed with the patient. Potential for esophageal dilation, biopsy, etc. have also been reviewed.  Questions have been answered. All parties agreeable.

## 2017-01-02 NOTE — Op Note (Signed)
[facility] Patient Name: Sean Leonard Procedure Date: 01/02/2017 8:44 AM MRN: [id number] Date of Birth: [birth year] Attending MD: Norvel Richards , MD CSN: [id number] Age: 71 Admit Type: Outpatient Procedure:                Upper GI endoscopy Indications:              Screening for Barrett's esophagus, Surveillance                            procedure Providers:                Norvel Richards, MD, Selena Lesser, Lurline Del, RN, Aram Candela Referring MD:              Medicines:                Propofol per Anesthesia Complications:            No immediate complications. Estimated Blood Loss:     Estimated blood loss was minimal. Procedure:                Pre-Anesthesia Assessment:                           - Prior to the procedure, a History and Physical                            was performed, and patient medications and                            allergies were reviewed. The patient's tolerance of                            previous anesthesia was also reviewed. The risks                            and benefits of the procedure and the sedation                            options and risks were discussed with the patient.                            All questions were answered, and informed consent                            was obtained. Prior Anticoagulants: The patient has                            taken no previous anticoagulant or antiplatelet                            agents. ASA Grade Assessment: II - A patient with  mild systemic disease. After reviewing the risks                            and benefits, the patient was deemed in                            satisfactory condition to undergo the procedure.                           After obtaining informed consent, the endoscope was                            passed under direct vision. Throughout the                            procedure, the patient's  blood pressure, pulse, and                            oxygen saturations were monitored continuously. The                            [id number] ([id number]) scope was introduced through the                            and advanced to the second part of duodenum. The                            upper GI endoscopy was accomplished without                            difficulty. The patient tolerated the procedure                            well. The patient tolerated the procedure well. Scope In: 9:18:34 AM Scope Out: 9:24:49 AM Total Procedure Duration: 0 hours 6 minutes 15 seconds  Findings:      Mucosal changes were found in the esophagus. undulated, Z line. not a       very impressive appearance for branch. No nodularity. No       esophagitis.This was biopsied with a cold forceps for histology.       Estimated blood loss was minimal.      A small hiatal hernia was present.      Mucosal changes were found in the stomach. Some nake skinnin scanning or       "fish scale appearance to gastric mucosa. No ulcer or infiltrating       process - .biopsied with a cold forceps for histology.      The ampulla and second portion of the duodenum were normal. Impression:               - Mucosal changes in the esophagus. Biopsied.                           - Small hiatal hernia.                           -  Mucosal changes in the stomach. Biopsied.                           - Normal ampulla and second portion of the duodenum. Moderate Sedation:      Moderate (conscious) sedation was personally administered by an       anesthesia professional. The following parameters were monitored: oxygen       saturation, heart rate, blood pressure, respiratory rate, EKG, adequacy       of pulmonary ventilation, and response to care. Total physician       intraservice time was 15 minutes. Recommendation:           - Patient has a contact number available for                            emergencies. The signs and symptoms of  potential                            delayed complications were discussed with the                            patient. Return to normal activities tomorrow.                            Written discharge instructions were provided to the                            patient.                           - Resume previous diet.                           - Continue present medications.                           - Repeat upper endoscopy after studies are complete                            for surveillance based on pathology results.                           - Return to GI clinic (date not yet determined). Procedure Code(s):        --- Professional ---                           [phone], Esophagogastroduodenoscopy, flexible,                            transoral; with biopsy, single or multiple Diagnosis Code(s):        --- Professional ---                           K22.8, Other specified diseases of esophagus                           K31.89,  Other diseases of stomach and duodenum                           K44.9, Diaphragmatic hernia without obstruction or                            gangrene                           Z13.810, Encounter for screening for upper                            gastrointestinal disorder CPT copyright 2016 [employer]. All rights reserved. The codes documented in this report are preliminary and upon coder review may  be revised to meet current compliance requirements. [name]. [name], MD Norvel Richards, MD 01/02/2017 9:34:41 AM This report has been signed electronically. Number of Addenda: 0

## 2017-01-02 NOTE — Anesthesia Procedure Notes (Signed)
Procedure Name: MAC Date/Time: 01/02/2017 9:07 AM Performed by: Andree Elk, Cyndel Griffey A Pre-anesthesia Checklist: Patient identified, Emergency Drugs available, Suction available, Patient being monitored and Timeout performed Oxygen Delivery Method: Simple face mask

## 2017-01-02 NOTE — Anesthesia Preprocedure Evaluation (Signed)
Anesthesia Evaluation  Patient identified by MRN, date of birth, ID band Patient awake    Reviewed: Allergy & Precautions, NPO status , Patient's Chart, lab work & pertinent test results  Airway Mallampati: II  TM Distance: >3 FB Neck ROM: Full    Dental  (+) Edentulous Upper, Edentulous Lower   Pulmonary former smoker,    breath sounds clear to auscultation       Cardiovascular + Peripheral Vascular Disease ( Iliac artery aneurysm, bilateral  )   Rhythm:Regular Rate:Tachycardia      Neuro/Psych    GI/Hepatic PUD, (+)     substance abuse  marijuana use, Barrett's   Endo/Other  Hypothyroidism Hyperthyroidism   Renal/GU      Musculoskeletal  (+) Arthritis ,   Abdominal   Peds  Hematology   Anesthesia Other Findings   Reproductive/Obstetrics                             Anesthesia Physical Anesthesia Plan  ASA: III  Anesthesia Plan: MAC   Post-op Pain Management:    Induction: Intravenous  PONV Risk Score and Plan:   Airway Management Planned: Simple Face Mask  Additional Equipment:   Intra-op Plan:   Post-operative Plan:   Informed Consent: I have reviewed the patients History and Physical, chart, labs and discussed the procedure including the risks, benefits and alternatives for the proposed anesthesia with the patient or authorized representative who has indicated his/her understanding and acceptance.     Plan Discussed with:   Anesthesia Plan Comments:         Anesthesia Quick Evaluation

## 2017-01-02 NOTE — Transfer of Care (Signed)
Immediate Anesthesia Transfer of Care Note  Patient: Sean Leonard  Procedure(s) Performed: ESOPHAGOGASTRODUODENOSCOPY (EGD) WITH PROPOFOL (N/A ) BIOPSY  Patient Location: PACU  Anesthesia Type:MAC  Level of Consciousness: awake, oriented and patient cooperative  Airway & Oxygen Therapy: Patient Spontanous Breathing and Patient connected to nasal cannula oxygen  Post-op Assessment: Report given to RN and Post -op Vital signs reviewed and stable  Post vital signs: Reviewed and stable  Last Vitals:  Vitals:   01/02/17 0830 01/02/17 0845  BP: 115/82 120/78  Resp: 15 16  Temp:    SpO2: 96% 93%    Last Pain:  Vitals:   01/02/17 0800  TempSrc: Oral      Patients Stated Pain Goal: 8 (84/16/60 6301)  Complications: No apparent anesthesia complications

## 2017-01-02 NOTE — H&P (View-Only) (Signed)
Primary Care Physician: Lavella Lemons, [place]  Primary Gastroenterologist:  Garfield Cornea, MD   Chief Complaint  Patient presents with  . Constipation  . Anemia    HPI: Sean Leonard is a 71 y.o. male here for follow-up. Last seen in May. Admitted in the hospital back in March for lower GI bleeding. Colonoscopy was completed on April 2 which noted diverticula and cecal polyp status post polypectomy. The patient had continued bleeding and a tagged bleeding scan was completed found no source of bleeding. Surgery recommended repeat colonoscopy and subtotal colectomy felt to be last resort. The patient was given a total of 12 units of blood during the admission. A repeat colonoscopy completed 06/28/2016 found ascending colon ulcers status post clipping which appeared to resolve bleeding.  Due for surveillance colonoscopy 5-10 years for simple adenoma.  Patient with history of Barrett's esophagus, due surveillance EGD at any time.  Patient denies any further rectal bleeding. His stools are dark on iron. Complains of feeling cold all the time. States his typical weight is around 180 pounds Blood count back to normal. Thyroid ok. 180 pounds and keeps losing. Down to 169 now. Was 176 back in May. Appetite comes and goes but somewhat poor. No dysphagia. No vomiting. No heartburn. Drinking Ensure. Stool softeners three times per week. No brbpr. Stool dark on iron. No abdominal pain.   Recent labs by PCP obtained while patient was in the office. TSH 0.3-1 slightly low, glucose 84, BUN 9, creatinine 0.84, albumin 4, white blood cell count 5900, hemoglobin 13 low end of normal, platelets 271,000.   Current Outpatient Prescriptions  Medication Sig Dispense Refill  . ferrous sulfate 325 (65 FE) MG tablet Take 1 tablet (325 mg total) by mouth 2 (two) times daily with a meal. 60 tablet 2  . fish oil-omega-3 fatty acids 1000 MG capsule Take 2 g by mouth daily.    Marland Kitchen levothyroxine (SYNTHROID,  LEVOTHROID) 112 MCG tablet Take 112 mcg by mouth every morning.    . Multiple Vitamin (MULTIVITAMIN WITH MINERALS) TABS tablet Take 1 tablet by mouth daily.    Marland Kitchen omeprazole (PRILOSEC) 20 MG capsule TAKE 1 CAPSULE EVERY DAY 90 capsule 0  . pravastatin (PRAVACHOL) 20 MG tablet Take 20 mg by mouth at bedtime.     No current facility-administered medications for this visit.     Allergies as of 12/09/2016  . (No Known Allergies)   Past Medical History:  Diagnosis Date  . DDD (degenerative disc disease), lumbar   . Diverticula of colon   . Iliac artery aneurysm, bilateral (HCC)    with significant stenosis of right internal iliac artery  . Peptic ulcer   . Prostatitis   . Thyroid disease    Past Surgical History:  Procedure Laterality Date  . COLONOSCOPY  April 2005   Dr. Irving Shows. Small internal hemorrhoids. Multiple small scattered diverticula in the cecum and descending colon.  . COLONOSCOPY N/A 01/18/2014   Procedure: COLONOSCOPY;  Surgeon: Daneil Dolin, MD;  [facility];  Service: Endoscopy;  Laterality: N/A;  1100  . COLONOSCOPY N/A 06/23/2016   Procedure: COLONOSCOPY;  Surgeon: Rogene Houston, MD;  [facility];  Service: Endoscopy;  Laterality: N/A;  . COLONOSCOPY WITH ESOPHAGOGASTRODUODENOSCOPY (EGD) N/A 12/16/2012   Dr. Gala Romney: rectal and colonic polyps with inadequate preparation, tubular adenoma. EGD with biopsy proven short segment Barrett's esophagus, hiatal hernia, mild chronic inactive gastritis   . COLONOSCOPY WITH  PROPOFOL N/A 06/24/2016   Procedure: COLONOSCOPY WITH PROPOFOL;  Surgeon: Danie Binder, MD;  [facility];  Service: Endoscopy;  Laterality: N/A;  . COLONOSCOPY WITH PROPOFOL N/A 06/28/2016   Procedure: COLONOSCOPY WITH PROPOFOL;  Surgeon: Mauri Pole, MD;  Location: [facility];  Service: Endoscopy;  Laterality: N/A;  . ESOPHAGOGASTRODUODENOSCOPY  July 2005   Dr. Irving Shows grade 2 esophagitis at the GE junction.  Acute gastritis. Multiple acute active, deep, irregular, friable ulcers in the antrum and body of the stomach. 2 active, deep, friable, irregular ulcers in the duodenum. Pathology not available.  . ESOPHAGOGASTRODUODENOSCOPY N/A 01/18/2014   Procedure: ESOPHAGOGASTRODUODENOSCOPY (EGD);  Surgeon: Daneil Dolin, MD;  [facility];  Service: Endoscopy;  Laterality: N/A;  . IR ANGIOGRAM VISCERAL SELECTIVE  06/26/2016  . IR ANGIOGRAM VISCERAL SELECTIVE  06/26/2016  . IR ANGIOGRAM VISCERAL SELECTIVE  06/26/2016  . IR US GUIDE VASC ACCESS RIGHT  06/26/2016  . POLYPECTOMY  06/24/2016   Procedure: POLYPECTOMY;  Surgeon: Danie Binder, MD;  [facility];  Service: Endoscopy;;  cecal  . THYROIDECTOMY    . TONSILLECTOMY     Family History  Problem Relation Age of Onset  . Hypertension Mother   . Heart disease Mother        before age 64  . Heart disease Father        before age 17  . Hyperlipidemia Sister   . Cancer Sister   . Colon cancer Neg Hx   . Liver disease Neg Hx    Social History  Substance Use Topics  . Smoking status: Former Smoker    Years: 25.00    Types: Cigars    Quit date: 11/27/2006  . Smokeless tobacco: Never Used     Comment: Quit in 2009  . Alcohol use No     Comment: former. weekend drinker (liqour) before, May 2015 last time he drank during a Therapist, nutritional.      ROS:  General: Negative for anorexia, weight loss, fever, chills, fatigue, weakness. ENT: Negative for hoarseness, difficulty swallowing , nasal congestion. CV: Negative for chest pain, angina, palpitations, dyspnea on exertion, peripheral edema.  Respiratory: Negative for dyspnea at rest, dyspnea on exertion, cough, sputum, wheezing.  GI: See history of present illness. GU:  Negative for dysuria, hematuria, urinary incontinence, urinary frequency, nocturnal urination.  Endo: Negative for unusual weight change.    Physical Examination:   BP 117/71   Pulse 86   Temp (!) 97.2 F (36.2 C)  (Oral)   Ht 6\' 2"  (1.88 m)   Wt 169 lb 3.2 oz (76.7 kg)   BMI 21.72 kg/m   General: Well-nourished, well-developed in no acute distress.  Eyes: No icterus. Mouth: Oropharyngeal mucosa moist and pink , no lesions erythema or exudate. Lungs: Clear to auscultation bilaterally.  Heart: Regular rate and rhythm, no murmurs rubs or gallops.  Abdomen: Bowel sounds are normal, nontender, nondistended, no hepatosplenomegaly or masses, no abdominal bruits or hernia , no rebound or guarding.   Extremities: No lower extremity edema. No clubbing or deformities. Neuro: Alert and oriented x 4   Skin: Warm and dry, no jaundice.   Psych: Alert and cooperative, normal mood and affect.  Labs:  See above Imaging Studies: No results found.

## 2017-01-07 ENCOUNTER — Encounter: Payer: Self-pay | Admitting: Internal Medicine

## 2017-01-08 ENCOUNTER — Encounter (HOSPITAL_COMMUNITY): Payer: Self-pay | Admitting: Internal Medicine

## 2017-01-15 ENCOUNTER — Other Ambulatory Visit (HOSPITAL_COMMUNITY): Payer: Self-pay | Admitting: Physician Assistant

## 2017-01-15 DIAGNOSIS — M5416 Radiculopathy, lumbar region: Secondary | ICD-10-CM

## 2017-01-23 ENCOUNTER — Ambulatory Visit (HOSPITAL_COMMUNITY)
Admission: RE | Admit: 2017-01-23 | Discharge: 2017-01-23 | Disposition: A | Discharge status: Home / Self Care | Payer: Medicare HMO | Source: Ambulatory Visit | Attending: Physician Assistant | Admitting: Physician Assistant

## 2017-01-23 DIAGNOSIS — M5416 Radiculopathy, lumbar region: Secondary | ICD-10-CM | POA: Diagnosis not present

## 2017-01-23 DIAGNOSIS — M48061 Spinal stenosis, lumbar region without neurogenic claudication: Secondary | ICD-10-CM | POA: Insufficient documentation

## 2017-01-23 DIAGNOSIS — M4807 Spinal stenosis, lumbosacral region: Secondary | ICD-10-CM | POA: Insufficient documentation

## 2017-01-23 DIAGNOSIS — M5136 Other intervertebral disc degeneration, lumbar region: Secondary | ICD-10-CM | POA: Diagnosis not present

## 2017-01-23 DIAGNOSIS — M545 Low back pain: Secondary | ICD-10-CM | POA: Diagnosis not present

## 2017-02-05 ENCOUNTER — Other Ambulatory Visit: Payer: Self-pay

## 2017-02-05 DIAGNOSIS — M48062 Spinal stenosis, lumbar region with neurogenic claudication: Secondary | ICD-10-CM | POA: Diagnosis not present

## 2017-02-05 DIAGNOSIS — D649 Anemia, unspecified: Secondary | ICD-10-CM

## 2017-02-05 DIAGNOSIS — R030 Elevated blood-pressure reading, without diagnosis of hypertension: Secondary | ICD-10-CM | POA: Diagnosis not present

## 2017-02-11 ENCOUNTER — Ambulatory Visit: Payer: Commercial Managed Care - HMO | Admitting: Vascular Surgery

## 2017-04-16 DIAGNOSIS — K5730 Diverticulosis of large intestine without perforation or abscess without bleeding: Secondary | ICD-10-CM | POA: Diagnosis not present

## 2017-04-16 DIAGNOSIS — E039 Hypothyroidism, unspecified: Secondary | ICD-10-CM | POA: Diagnosis not present

## 2017-04-16 DIAGNOSIS — K633 Ulcer of intestine: Secondary | ICD-10-CM | POA: Diagnosis not present

## 2017-04-16 DIAGNOSIS — K625 Hemorrhage of anus and rectum: Secondary | ICD-10-CM | POA: Diagnosis not present

## 2017-04-16 DIAGNOSIS — K219 Gastro-esophageal reflux disease without esophagitis: Secondary | ICD-10-CM | POA: Diagnosis not present

## 2017-04-16 DIAGNOSIS — M25532 Pain in left wrist: Secondary | ICD-10-CM | POA: Diagnosis not present

## 2017-04-16 DIAGNOSIS — E782 Mixed hyperlipidemia: Secondary | ICD-10-CM | POA: Diagnosis not present

## 2017-04-16 DIAGNOSIS — Z6822 Body mass index (BMI) 22.0-22.9, adult: Secondary | ICD-10-CM | POA: Diagnosis not present

## 2017-06-16 DIAGNOSIS — K219 Gastro-esophageal reflux disease without esophagitis: Secondary | ICD-10-CM | POA: Diagnosis not present

## 2017-06-16 DIAGNOSIS — Z1389 Encounter for screening for other disorder: Secondary | ICD-10-CM | POA: Diagnosis not present

## 2017-06-16 DIAGNOSIS — Z6822 Body mass index (BMI) 22.0-22.9, adult: Secondary | ICD-10-CM | POA: Diagnosis not present

## 2017-06-16 DIAGNOSIS — E039 Hypothyroidism, unspecified: Secondary | ICD-10-CM | POA: Diagnosis not present

## 2017-06-16 DIAGNOSIS — E782 Mixed hyperlipidemia: Secondary | ICD-10-CM | POA: Diagnosis not present

## 2017-06-16 DIAGNOSIS — Z1331 Encounter for screening for depression: Secondary | ICD-10-CM | POA: Diagnosis not present

## 2017-06-16 DIAGNOSIS — Z0000 Encounter for general adult medical examination without abnormal findings: Secondary | ICD-10-CM | POA: Diagnosis not present

## 2017-08-05 DIAGNOSIS — E039 Hypothyroidism, unspecified: Secondary | ICD-10-CM | POA: Diagnosis not present

## 2017-08-06 DIAGNOSIS — E042 Nontoxic multinodular goiter: Secondary | ICD-10-CM | POA: Diagnosis not present

## 2017-09-22 DIAGNOSIS — E039 Hypothyroidism, unspecified: Secondary | ICD-10-CM | POA: Diagnosis not present

## 2017-09-22 DIAGNOSIS — R634 Abnormal weight loss: Secondary | ICD-10-CM | POA: Diagnosis not present

## 2017-09-30 DIAGNOSIS — E7800 Pure hypercholesterolemia, unspecified: Secondary | ICD-10-CM | POA: Diagnosis not present

## 2017-09-30 DIAGNOSIS — H2510 Age-related nuclear cataract, unspecified eye: Secondary | ICD-10-CM | POA: Diagnosis not present

## 2017-09-30 DIAGNOSIS — H5200 Hypermetropia, unspecified eye: Secondary | ICD-10-CM | POA: Diagnosis not present

## 2017-10-22 DIAGNOSIS — E039 Hypothyroidism, unspecified: Secondary | ICD-10-CM | POA: Diagnosis not present

## 2017-10-22 DIAGNOSIS — E785 Hyperlipidemia, unspecified: Secondary | ICD-10-CM | POA: Diagnosis not present

## 2017-10-22 DIAGNOSIS — N529 Male erectile dysfunction, unspecified: Secondary | ICD-10-CM | POA: Diagnosis not present

## 2017-10-22 DIAGNOSIS — E559 Vitamin D deficiency, unspecified: Secondary | ICD-10-CM | POA: Diagnosis not present

## 2017-10-22 DIAGNOSIS — Z125 Encounter for screening for malignant neoplasm of prostate: Secondary | ICD-10-CM | POA: Diagnosis not present

## 2017-10-22 DIAGNOSIS — D649 Anemia, unspecified: Secondary | ICD-10-CM | POA: Diagnosis not present

## 2017-10-22 DIAGNOSIS — G5602 Carpal tunnel syndrome, left upper limb: Secondary | ICD-10-CM | POA: Diagnosis not present

## 2017-11-18 DIAGNOSIS — K219 Gastro-esophageal reflux disease without esophagitis: Secondary | ICD-10-CM | POA: Diagnosis not present

## 2017-11-18 DIAGNOSIS — M792 Neuralgia and neuritis, unspecified: Secondary | ICD-10-CM | POA: Diagnosis not present

## 2017-11-18 DIAGNOSIS — E039 Hypothyroidism, unspecified: Secondary | ICD-10-CM | POA: Diagnosis not present

## 2017-11-18 DIAGNOSIS — M79645 Pain in left finger(s): Secondary | ICD-10-CM | POA: Diagnosis not present

## 2017-11-18 DIAGNOSIS — Z6822 Body mass index (BMI) 22.0-22.9, adult: Secondary | ICD-10-CM | POA: Diagnosis not present

## 2017-11-18 DIAGNOSIS — E782 Mixed hyperlipidemia: Secondary | ICD-10-CM | POA: Diagnosis not present

## 2017-12-04 DIAGNOSIS — N529 Male erectile dysfunction, unspecified: Secondary | ICD-10-CM | POA: Diagnosis not present

## 2017-12-11 DIAGNOSIS — R202 Paresthesia of skin: Secondary | ICD-10-CM | POA: Diagnosis not present

## 2017-12-11 DIAGNOSIS — G5600 Carpal tunnel syndrome, unspecified upper limb: Secondary | ICD-10-CM | POA: Diagnosis not present

## 2017-12-11 DIAGNOSIS — R200 Anesthesia of skin: Secondary | ICD-10-CM | POA: Diagnosis not present

## 2017-12-11 DIAGNOSIS — Z6824 Body mass index (BMI) 24.0-24.9, adult: Secondary | ICD-10-CM | POA: Diagnosis not present

## 2017-12-11 DIAGNOSIS — Z23 Encounter for immunization: Secondary | ICD-10-CM | POA: Diagnosis not present

## 2017-12-31 ENCOUNTER — Ambulatory Visit (INDEPENDENT_AMBULATORY_CARE_PROVIDER_SITE_OTHER): Payer: Self-pay

## 2017-12-31 ENCOUNTER — Encounter (INDEPENDENT_AMBULATORY_CARE_PROVIDER_SITE_OTHER): Payer: Self-pay | Admitting: Orthopaedic Surgery

## 2017-12-31 ENCOUNTER — Ambulatory Visit (INDEPENDENT_AMBULATORY_CARE_PROVIDER_SITE_OTHER): Payer: Medicare HMO | Admitting: Orthopaedic Surgery

## 2017-12-31 ENCOUNTER — Ambulatory Visit (INDEPENDENT_AMBULATORY_CARE_PROVIDER_SITE_OTHER): Payer: Medicare HMO

## 2017-12-31 VITALS — BP 126/83 | HR 76 | Ht 74.0 in | Wt 189.0 lb

## 2017-12-31 DIAGNOSIS — G5602 Carpal tunnel syndrome, left upper limb: Secondary | ICD-10-CM | POA: Insufficient documentation

## 2017-12-31 DIAGNOSIS — G5601 Carpal tunnel syndrome, right upper limb: Secondary | ICD-10-CM

## 2017-12-31 NOTE — Progress Notes (Signed)
Carpal tunnel   Office Visit Note   Patient: Sean Leonard           Date of Birth: 12/14/1945           MRN: [id number] Visit Date: 12/31/2017              Requested by: Lavella Lemons, PA [place], [place] [address] PCP: Lavella Lemons, [place]   Assessment & Plan: Visit Diagnoses:  1. Carpal tunnel syndrome, left upper limb     Plan: bil CTS left greater than right-will obtain EMG's and NCV's and reevaluate  Follow-Up Instructions: Return after EMG's.   Orders:  No orders of the defined types were placed in this encounter.  No orders of the defined types were placed in this encounter.     Procedures: No procedures performed   Clinical Data: No additional findings.   Subjective: No chief complaint on file. 72 yr old gentleman with long hx bil hand numbness and tingling left greater than right.Pain at night. Some relief with splints  HPI  Review of Systems   Objective: Vital Signs: There were no vitals taken for this visit.  Physical Exam  Constitutional: He is oriented to person, place, and time. He appears well-developed and well-nourished.  HENT:  Mouth/Throat: Oropharynx is clear and moist.  Eyes: Pupils are equal, round, and reactive to light. EOM are normal.  Pulmonary/Chest: Effort normal.  Neurological: He is alert and oriented to person, place, and time.  Skin: Skin is warm and dry.  Psychiatric: He has a normal mood and affect. His behavior is normal.    Ortho Examleft hand lacks 3 FB's touching tiops of fingers to palm related to OA.skin intact. Pos tinels and phalen's. Multiple joint deformities re to OA. N/v intact  Specialty Comments:  No specialty comments available.  Imaging: No results found.   PMFS History: Patient Active Problem List   Diagnosis Date Noted  . Carpal tunnel syndrome, left upper limb 12/31/2017  . Anorexia 12/09/2016  . Anemia 12/09/2016  . Constipation 08/22/2016  . Abdominal pain   . Acute  lower GI bleeding   . Hematochezia   . Colon ulcer   . Gastrointestinal hemorrhage associated with intestinal diverticulitis 06/22/2016  . Barrett's esophagus 12/29/2013  . Adenomatous colon polyp 12/29/2013  . Iliac artery aneurysm ([facility]) 11/26/2012  . Loss of weight 11/19/2012  . History of peptic ulcer disease 11/19/2012  . Abdominal pain, left upper quadrant 11/19/2012  . Abdominal pain, epigastric 11/19/2012  . Elevated PSA, between 10 and less than 20 ng/ml 11/19/2012  . Iliac artery stenosis, right ([place]) 11/19/2012  . Aneurysm of internal iliac artery ([facility]) 11/19/2012   Past Medical History:  Diagnosis Date  . DDD (degenerative disc disease), lumbar   . Diverticula of colon   . Hyperthyroidism   . Iliac artery aneurysm, bilateral (HCC)    with significant stenosis of right internal iliac artery  . Peptic ulcer   . Prostatitis   . Thyroid disease     Family History  Problem Relation Age of Onset  . Hypertension Mother   . Heart disease Mother        before age 25  . Heart disease Father        before age 91  . Hyperlipidemia Sister   . Cancer Sister   . Colon cancer Neg Hx   . Liver disease Neg Hx     Past Surgical History:  Procedure Laterality Date  .  BIOPSY  01/02/2017   Procedure: BIOPSY;  Surgeon: Daneil Dolin, MD;  [facility];  Service: Endoscopy;;  GASTRIC AND ESOPHAGEAL BIOPSIES  . COLONOSCOPY  April 2005   Dr. Irving Shows. Small internal hemorrhoids. Multiple small scattered diverticula in the cecum and descending colon.  . COLONOSCOPY N/A 01/18/2014   Procedure: COLONOSCOPY;  Surgeon: Daneil Dolin, MD;  [facility];  Service: Endoscopy;  Laterality: N/A;  1100  . COLONOSCOPY N/A 06/23/2016   Procedure: COLONOSCOPY;  Surgeon: Rogene Houston, MD;  [facility];  Service: Endoscopy;  Laterality: N/A;  . COLONOSCOPY WITH ESOPHAGOGASTRODUODENOSCOPY (EGD) N/A 12/16/2012   Dr. Gala Romney: rectal and colonic polyps with  inadequate preparation, tubular adenoma. EGD with biopsy proven short segment Barrett's esophagus, hiatal hernia, mild chronic inactive gastritis   . COLONOSCOPY WITH PROPOFOL N/A 06/24/2016   Procedure: COLONOSCOPY WITH PROPOFOL;  Surgeon: Danie Binder, MD;  [facility];  Service: Endoscopy;  Laterality: N/A;  . COLONOSCOPY WITH PROPOFOL N/A 06/28/2016   Procedure: COLONOSCOPY WITH PROPOFOL;  Surgeon: Mauri Pole, MD;  Location: [facility];  Service: Endoscopy;  Laterality: N/A;  . ESOPHAGOGASTRODUODENOSCOPY  July 2005   Dr. Irving Shows grade 2 esophagitis at the GE junction. Acute gastritis. Multiple acute active, deep, irregular, friable ulcers in the antrum and body of the stomach. 2 active, deep, friable, irregular ulcers in the duodenum. Pathology not available.  . ESOPHAGOGASTRODUODENOSCOPY N/A 01/18/2014   Procedure: ESOPHAGOGASTRODUODENOSCOPY (EGD);  Surgeon: Daneil Dolin, MD;  [facility];  Service: Endoscopy;  Laterality: N/A;  . ESOPHAGOGASTRODUODENOSCOPY (EGD) WITH PROPOFOL N/A 01/02/2017   Procedure: ESOPHAGOGASTRODUODENOSCOPY (EGD) WITH PROPOFOL;  Surgeon: Daneil Dolin, MD;  [facility];  Service: Endoscopy;  Laterality: N/A;  9:15am  . IR ANGIOGRAM VISCERAL SELECTIVE  06/26/2016  . IR ANGIOGRAM VISCERAL SELECTIVE  06/26/2016  . IR ANGIOGRAM VISCERAL SELECTIVE  06/26/2016  . IR US GUIDE VASC ACCESS RIGHT  06/26/2016  . NOSE SURGERY    . POLYPECTOMY  06/24/2016   Procedure: POLYPECTOMY;  Surgeon: Danie Binder, MD;  [facility];  Service: Endoscopy;;  cecal  . THYROIDECTOMY    . TONSILLECTOMY     Social History   Occupational History  . Not on file  Tobacco Use  . Smoking status: Former Smoker    Years: 25.00    Types: Cigars    Last attempt to quit: 11/27/2006    Years since quitting: 11.1  . Smokeless tobacco: Never Used  . Tobacco comment: Quit in 2009  Substance and Sexual Activity  . Alcohol use: No     Alcohol/week: 0.0 standard drinks    Comment: former. weekend drinker (liqour) before, May 2015 last time he drank during a Therapist, nutritional.   . Drug use: Yes    Types: Marijuana    Comment: last smoked 2 weeks ago  . Sexual activity: Not on file     Garald Balding, MD   Note - This record has been created using [employer].  Chart creation errors have been sought, but may not always  have been located. Such creation errors do not reflect on  the standard of medical care.

## 2018-01-14 ENCOUNTER — Other Ambulatory Visit (INDEPENDENT_AMBULATORY_CARE_PROVIDER_SITE_OTHER): Payer: Self-pay | Admitting: Radiology

## 2018-01-14 ENCOUNTER — Telehealth (INDEPENDENT_AMBULATORY_CARE_PROVIDER_SITE_OTHER): Payer: Self-pay | Admitting: Orthopaedic Surgery

## 2018-01-14 DIAGNOSIS — M79641 Pain in right hand: Secondary | ICD-10-CM

## 2018-01-14 DIAGNOSIS — M79642 Pain in left hand: Secondary | ICD-10-CM

## 2018-01-14 NOTE — Telephone Encounter (Signed)
Order sent in and notified pt

## 2018-01-14 NOTE — Telephone Encounter (Signed)
Patient was suppose to be scheduled for a PNCS, but has not heard anything. Per patient it has been two weeks since he saw Dr. Durward Fortes. Patients hands hurting so bad, he cannot sleep at night. Please call patient to inform.

## 2018-01-28 ENCOUNTER — Encounter (INDEPENDENT_AMBULATORY_CARE_PROVIDER_SITE_OTHER): Payer: Self-pay | Admitting: Physical Medicine and Rehabilitation

## 2018-01-28 ENCOUNTER — Ambulatory Visit (INDEPENDENT_AMBULATORY_CARE_PROVIDER_SITE_OTHER): Payer: Medicare HMO | Admitting: Physical Medicine and Rehabilitation

## 2018-01-28 DIAGNOSIS — R202 Paresthesia of skin: Secondary | ICD-10-CM | POA: Diagnosis not present

## 2018-01-28 NOTE — Progress Notes (Signed)
.     Numeric Pain Rating Scale and Functional Assessment Average Pain 10   In the last MONTH (on 0-10 scale) has pain interfered with the following?  1. General activity like being  able to carry out your everyday physical activities such as walking, climbing stairs, carrying groceries, or moving a chair?  Rating(7)   +Driver, -BT, -Dye Allergies.  

## 2018-01-30 NOTE — Progress Notes (Signed)
Sean Leonard - 72 y.o. male MRN [id number]  Date of birth: 09-30-1945  Office Visit Note: Visit Date: 01/28/2018 PCP: Lavella Lemons, PA Referred by: Lavella Lemons, PA  Subjective: Chief Complaint  Patient presents with  . Left Arm - Pain, Numbness, Tingling  . Left Hand - Numbness, Pain, Tingling  . Right Hand - Tingling, Numbness, Pain   HPI:  Sean Leonard is a 72 y.o. male who comes in today At the request of Dr. Joni Fears for electrodiagnostic studies of both upper limbs.  Patient is right-hand dominant with left worse than right pain numbness and tingling particularly in the index middle and ring fingers on both hands.  He reports that symptoms started last year and it progressed over time.  He feels like they started after a hospitalization.  He reports nocturnal complaints more than during the day but really constant pain and numbness overall.  He feels like he has decreased ability to manipulate small objects and buttoning close.  He will actually use warm water to help with his symptoms at times.  He has had no prior electrodiagnostic studies.  He has tried gabapentin and anti-inflammatories without relief.  ROS Otherwise per HPI.  Assessment & Plan: Visit Diagnoses:  1. 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 The lesion is characterized by sensory and motor demyelination with evidence of of axonal injury.   There is no significant electrodiagnostic evidence of any other focal nerve entrapment, brachial plexopathy, cervical radiculopathy or generalized peripheral neuropathy  Recommendations: 1.  Follow-up with referring physician. 2.  Continue current management of symptoms. 3.  Suggest surgical evaluation.    Meds & Orders: No orders of the  defined types were placed in this encounter.   Orders Placed This Encounter  Procedures  . NCV with EMG (electromyography)    Follow-up: Return for Joni Fears, MD.   Procedures: No procedures performed  EMG & NCV Findings: Evaluation of the left median motor and the right median motor nerves showed prolonged distal onset latency (L12.7, R7.9 ms), reduced amplitude (L1.7, R3.5 mV), and decreased conduction velocity (Elbow-Wrist, L42, R48 m/s).  The left median (across palm) sensory nerve showed prolonged distal peak latency (Wrist, 7.1 ms) and prolonged distal peak latency (Palm, 4.9 ms).  The right median (across palm) sensory nerve showed no response (Wrist) and no response (Palm).  All remaining nerves (as indicated in the following tables) were within normal limits.  Left vs. Right side comparison data for the median motor nerve indicates abnormal L-R latency difference (4.8 ms).  The ulnar motor nerve indicates abnormal L-R velocity difference (A Elbow-B Elbow, 20 m/s).  All remaining left vs. right side differences were within normal limits.    Needle evaluation of the right abductor pollicis brevis muscle showed slightly increased spontaneous activity.  The left abductor pollicis brevis muscle showed moderately increased spontaneous activity.  All remaining muscles (as indicated in the following table) showed no evidence of electrical instability.    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 The lesion is characterized by sensory and motor demyelination with evidence of of axonal injury.   There is no  significant electrodiagnostic evidence of any other focal nerve entrapment, brachial plexopathy, cervical radiculopathy or generalized peripheral neuropathy  Recommendations: 1.  Follow-up with referring  physician. 2.  Continue current management of symptoms. 3.  Suggest surgical evaluation.  ___________________________ Laurence Spates FAAPMR Board Certified, American Board of Physical Medicine and Rehabilitation    Nerve Conduction Studies Anti Sensory Summary Table   Stim Site NR Peak (ms) Norm Peak (ms) P-T Amp (V) Norm P-T Amp Site1 Site2 Delta-P (ms) Dist (cm) Vel (m/s) Norm Vel (m/s)  Left Median Acr Palm Anti Sensory (2nd Digit)  33.4C  Wrist    *7.1 <3.6 10.6 >10 Wrist Palm 2.2 0.0    Palm    *4.9 <2.0 2.7         Right Median Acr Palm Anti Sensory (2nd Digit)  32.6C  Wrist *NR  <3.6  >10 Wrist Palm  0.0    Palm *NR  <2.0          Left Radial Anti Sensory (Base 1st Digit)  33C  Wrist    2.3 <3.1 23.1  Wrist Base 1st Digit 2.3 0.0    Right Radial Anti Sensory (Base 1st Digit)  32.9C  Wrist    2.2 <3.1 16.4  Wrist Base 1st Digit 2.2 0.0    Left Ulnar Anti Sensory (5th Digit)  33.5C  Wrist    3.4 <3.7 21.3 >15.0 Wrist 5th Digit 3.4 14.0 41 >38  Right Ulnar Anti Sensory (5th Digit)  33C  Wrist    3.4 <3.7 17.5 >15.0 Wrist 5th Digit 3.4 14.0 41 >38   Motor Summary Table   Stim Site NR Onset (ms) Norm Onset (ms) O-P Amp (mV) Norm O-P Amp Site1 Site2 Delta-0 (ms) Dist (cm) Vel (m/s) Norm Vel (m/s)  Left Median Motor (Abd Poll Brev)  33.1C  Wrist    *12.7 <4.2 *1.7 >5 Elbow Wrist 5.9 25.0 *42 >50  Elbow    18.6  1.5         Right Median Motor (Abd Poll Brev)  33C  Wrist    *7.9 <4.2 *3.5 >5 Elbow Wrist 5.2 25.0 *48 >50  Elbow    13.1  3.4         Left Ulnar Motor (Abd Dig Min)  33.3C  Wrist    2.9 <4.2 9.0 >3 B Elbow Wrist 4.1 24.0 59 >53  B Elbow    7.0  9.0  A Elbow B Elbow 1.6 10.0 63 >53  A Elbow    8.6  9.0         Right Ulnar Motor (Abd Dig Min)  33.3C  Wrist    2.8 <4.2 9.8 >3 B Elbow Wrist 4.2 24.0 57 >53  B Elbow    7.0  9.3  A Elbow B Elbow 1.2 10.0 83 >53  A Elbow    8.2  9.1          EMG   Side Muscle Nerve Root Ins Act Fibs Psw Amp Dur Poly Recrt  Int Fraser Din Comment  Right Abd Poll Brev Median C8-T1 Nml *1+ *1+ Nml Nml 0 Nml Nml   Left Abd Poll Brev Median C8-T1 Nml *2+ *2+ Nml Nml 0 Nml Nml   Left 1stDorInt Ulnar C8-T1 Nml Nml Nml Nml Nml 0 Nml Nml   Left PronatorTeres Median C6-7 Nml Nml Nml Nml Nml 0 Nml Nml   Left Biceps Musculocut C5-6 Nml Nml Nml Nml Nml 0 Nml Nml   Left Deltoid Axillary C5-6 Nml  Nml Nml Nml Nml 0 Nml Nml     Nerve Conduction Studies Anti Sensory Left/Right Comparison   Stim Site L Lat (ms) R Lat (ms) L-R Lat (ms) L Amp (V) R Amp (V) L-R Amp (%) Site1 Site2 L Vel (m/s) R Vel (m/s) L-R Vel (m/s)  Median Acr Palm Anti Sensory (2nd Digit)  33.4C  Wrist *7.1   10.6   Wrist Palm     Palm *4.9   2.7         Radial Anti Sensory (Base 1st Digit)  33C  Wrist 2.3 2.2 0.1 23.1 16.4 29.0 Wrist Base 1st Digit     Ulnar Anti Sensory (5th Digit)  33.5C  Wrist 3.4 3.4 0.0 21.3 17.5 17.8 Wrist 5th Digit 41 41 0   Motor Left/Right Comparison   Stim Site L Lat (ms) R Lat (ms) L-R Lat (ms) L Amp (mV) R Amp (mV) L-R Amp (%) Site1 Site2 L Vel (m/s) R Vel (m/s) L-R Vel (m/s)  Median Motor (Abd Poll Brev)  33.1C  Wrist *12.7 *7.9 *4.8 *1.7 *3.5 51.4 Elbow Wrist *42 *48 6  Elbow 18.6 13.1 5.5 1.5 3.4 55.9       Ulnar Motor (Abd Dig Min)  33.3C  Wrist 2.9 2.8 0.1 9.0 9.8 8.2 B Elbow Wrist 59 57 2  B Elbow 7.0 7.0 0.0 9.0 9.3 3.2 A Elbow B Elbow 63 83 *20  A Elbow 8.6 8.2 0.4 9.0 9.1 1.1          Waveforms:                     Clinical History: No specialty comments available.     Objective:  VS:  HT:    WT:   BMI:     BP:   HR: bpm  TEMP: ( )  RESP:  Physical Exam  Constitutional: He is oriented to person, place, and time.  Musculoskeletal: He exhibits no edema or tenderness.  Inspection reveals flattening of the bilateral APB and osteoarthritis changes of both CMC joints but no atrophy of the bilateral FDI or hand intrinsics. There is no swelling, color changes, allodynia or dystrophic changes.  There is 5 out of 5 strength in the bilateral wrist extension, finger abduction and long finger flexion.  There is decreased sensation to light touch in a median nerve distribution left more prominent than right.   There is a negative Hoffmann's test bilaterally.  Neurological: He is alert and oriented to person, place, and time. He exhibits normal muscle tone. Coordination normal.  Skin: Skin is warm and dry. No rash noted. No erythema.    Ortho Exam Imaging: No results found.

## 2018-01-30 NOTE — Procedures (Signed)
EMG & NCV Findings: Evaluation of the left median motor and the right median motor nerves showed prolonged distal onset latency (L12.7, R7.9 ms), reduced amplitude (L1.7, R3.5 mV), and decreased conduction velocity (Elbow-Wrist, L42, R48 m/s).  The left median (across palm) sensory nerve showed prolonged distal peak latency (Wrist, 7.1 ms) and prolonged distal peak latency (Palm, 4.9 ms).  The right median (across palm) sensory nerve showed no response (Wrist) and no response (Palm).  All remaining nerves (as indicated in the following tables) were within normal limits.  Left vs. Right side comparison data for the median motor nerve indicates abnormal L-R latency difference (4.8 ms).  The ulnar motor nerve indicates abnormal L-R velocity difference (A Elbow-B Elbow, 20 m/s).  All remaining left vs. right side differences were within normal limits.    Needle evaluation of the right abductor pollicis brevis muscle showed slightly increased spontaneous activity.  The left abductor pollicis brevis muscle showed moderately increased spontaneous activity.  All remaining muscles (as indicated in the following table) showed no evidence of electrical instability.    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 The lesion is characterized by sensory and motor demyelination with evidence of of axonal injury.   There is no significant electrodiagnostic evidence of any other focal nerve entrapment, brachial plexopathy, cervical radiculopathy or generalized peripheral neuropathy  Recommendations: 1.  Follow-up with referring physician. 2.  Continue current management of symptoms. 3.  Suggest surgical evaluation.  ___________________________ Laurence Spates FAAPMR Board Certified, American Board of Physical Medicine and  Rehabilitation    Nerve Conduction Studies Anti Sensory Summary Table   Stim Site NR Peak (ms) Norm Peak (ms) P-T Amp (V) Norm P-T Amp Site1 Site2 Delta-P (ms) Dist (cm) Vel (m/s) Norm Vel (m/s)  Left Median Acr Palm Anti Sensory (2nd Digit)  33.4C  Wrist    *7.1 <3.6 10.6 >10 Wrist Palm 2.2 0.0    Palm    *4.9 <2.0 2.7         Right Median Acr Palm Anti Sensory (2nd Digit)  32.6C  Wrist *NR  <3.6  >10 Wrist Palm  0.0    Palm *NR  <2.0          Left Radial Anti Sensory (Base 1st Digit)  33C  Wrist    2.3 <3.1 23.1  Wrist Base 1st Digit 2.3 0.0    Right Radial Anti Sensory (Base 1st Digit)  32.9C  Wrist    2.2 <3.1 16.4  Wrist Base 1st Digit 2.2 0.0    Left Ulnar Anti Sensory (5th Digit)  33.5C  Wrist    3.4 <3.7 21.3 >15.0 Wrist 5th Digit 3.4 14.0 41 >38  Right Ulnar Anti Sensory (5th Digit)  33C  Wrist    3.4 <3.7 17.5 >15.0 Wrist 5th Digit 3.4 14.0 41 >38   Motor Summary Table   Stim Site NR Onset (ms) Norm Onset (ms) O-P Amp (mV) Norm O-P Amp Site1 Site2 Delta-0 (ms) Dist (cm) Vel (m/s) Norm Vel (m/s)  Left Median Motor (Abd Poll Brev)  33.1C  Wrist    *12.7 <4.2 *1.7 >5 Elbow Wrist 5.9 25.0 *42 >50  Elbow    18.6  1.5         Right Median Motor (Abd Poll Brev)  33C  Wrist    *7.9 <4.2 *3.5 >5 Elbow Wrist 5.2  25.0 *48 >50  Elbow    13.1  3.4         Left Ulnar Motor (Abd Dig Min)  33.3C  Wrist    2.9 <4.2 9.0 >3 B Elbow Wrist 4.1 24.0 59 >53  B Elbow    7.0  9.0  A Elbow B Elbow 1.6 10.0 63 >53  A Elbow    8.6  9.0         Right Ulnar Motor (Abd Dig Min)  33.3C  Wrist    2.8 <4.2 9.8 >3 B Elbow Wrist 4.2 24.0 57 >53  B Elbow    7.0  9.3  A Elbow B Elbow 1.2 10.0 83 >53  A Elbow    8.2  9.1          EMG   Side Muscle Nerve Root Ins Act Fibs Psw Amp Dur Poly Recrt Int Fraser Din Comment  Right Abd Poll Brev Median C8-T1 Nml *1+ *1+ Nml Nml 0 Nml Nml   Left Abd Poll Brev Median C8-T1 Nml *2+ *2+ Nml Nml 0 Nml Nml   Left 1stDorInt Ulnar C8-T1 Nml Nml Nml Nml Nml 0 Nml  Nml   Left PronatorTeres Median C6-7 Nml Nml Nml Nml Nml 0 Nml Nml   Left Biceps Musculocut C5-6 Nml Nml Nml Nml Nml 0 Nml Nml   Left Deltoid Axillary C5-6 Nml Nml Nml Nml Nml 0 Nml Nml     Nerve Conduction Studies Anti Sensory Left/Right Comparison   Stim Site L Lat (ms) R Lat (ms) L-R Lat (ms) L Amp (V) R Amp (V) L-R Amp (%) Site1 Site2 L Vel (m/s) R Vel (m/s) L-R Vel (m/s)  Median Acr Palm Anti Sensory (2nd Digit)  33.4C  Wrist *7.1   10.6   Wrist Palm     Palm *4.9   2.7         Radial Anti Sensory (Base 1st Digit)  33C  Wrist 2.3 2.2 0.1 23.1 16.4 29.0 Wrist Base 1st Digit     Ulnar Anti Sensory (5th Digit)  33.5C  Wrist 3.4 3.4 0.0 21.3 17.5 17.8 Wrist 5th Digit 41 41 0   Motor Left/Right Comparison   Stim Site L Lat (ms) R Lat (ms) L-R Lat (ms) L Amp (mV) R Amp (mV) L-R Amp (%) Site1 Site2 L Vel (m/s) R Vel (m/s) L-R Vel (m/s)  Median Motor (Abd Poll Brev)  33.1C  Wrist *12.7 *7.9 *4.8 *1.7 *3.5 51.4 Elbow Wrist *42 *48 6  Elbow 18.6 13.1 5.5 1.5 3.4 55.9       Ulnar Motor (Abd Dig Min)  33.3C  Wrist 2.9 2.8 0.1 9.0 9.8 8.2 B Elbow Wrist 59 57 2  B Elbow 7.0 7.0 0.0 9.0 9.3 3.2 A Elbow B Elbow 63 83 *20  A Elbow 8.6 8.2 0.4 9.0 9.1 1.1          Waveforms:

## 2018-02-04 ENCOUNTER — Encounter (INDEPENDENT_AMBULATORY_CARE_PROVIDER_SITE_OTHER): Payer: Self-pay | Admitting: Orthopaedic Surgery

## 2018-02-04 ENCOUNTER — Ambulatory Visit (INDEPENDENT_AMBULATORY_CARE_PROVIDER_SITE_OTHER): Payer: Medicare HMO | Admitting: Orthopaedic Surgery

## 2018-02-04 VITALS — BP 135/85 | HR 87 | Ht 74.0 in | Wt 191.0 lb

## 2018-02-04 DIAGNOSIS — G5602 Carpal tunnel syndrome, left upper limb: Secondary | ICD-10-CM

## 2018-02-04 NOTE — Progress Notes (Signed)
Office Visit Note   Patient: Sean Leonard           Date of Birth: [birth year]           MRN: [id number] Visit Date: 02/04/2018              Requested by: Lavella Lemons, PA [place], [place] [address] PCP: Lavella Lemons, [place]   Assessment & Plan: Visit Diagnoses:  1. Carpal tunnel syndrome, left upper limb     Plan: EMGs and nerve conduction studies demonstrate severe bilateral carpal tunnel syndrome worse on the left.  The left is more symptomatic.  Long discussion with Sean Leonard.  I would suggest surgical decompression of the median nerve left wrist.  He would like to proceed.  We will schedule at his convenience  Follow-Up Instructions: Return will schedule carpal tunnel surgery.   Orders:  No orders of the defined types were placed in this encounter.  No orders of the defined types were placed in this encounter.     Procedures: No procedures performed   Clinical Data: No additional findings.   Subjective: Chief Complaint  Patient presents with  . Follow-up    f/u nerve cond test and emgs    HPI  Review of Systems  Constitutional: Negative for fatigue.  HENT: Negative for ear pain.   Eyes: Negative for pain.  Respiratory: Negative for cough and shortness of breath.   Cardiovascular: Negative for leg swelling.  Gastrointestinal: Negative for constipation and diarrhea.  Genitourinary: Negative for difficulty urinating.  Musculoskeletal: Positive for back pain. Negative for neck pain.  Skin: Negative for rash.  Allergic/Immunologic: Negative for food allergies.  Neurological: Positive for weakness and numbness.  Hematological: Does not bruise/bleed easily.  Psychiatric/Behavioral: Positive for sleep disturbance.     Objective: Vital Signs: BP 135/85 (BP Location: Right Arm, Patient Position: Sitting, Cuff Size: Normal)   Pulse 87   Ht 6\' 2"  (1.88 m)   Wt 191 lb (86.6 kg)   BMI 24.52 kg/m   Physical Exam  Constitutional: He is  oriented to person, place, and time. He appears well-developed and well-nourished.  HENT:  Mouth/Throat: Oropharynx is clear and moist.  Eyes: Pupils are equal, round, and reactive to light. EOM are normal.  Pulmonary/Chest: Effort normal.  Neurological: He is alert and oriented to person, place, and time.  Skin: Skin is warm and dry.  Psychiatric: He has a normal mood and affect. His behavior is normal.    Ortho Exam wake alert and oriented x3.  Comfortable sitting.  Left hand with thenar atrophy compared to the right.  Good grip and good release and actually good strength with opposition of thumb to little finger.  Some decreased sensibility to subjectively able to make a full fist and release.  Positive Phalen's and Tinel's. Specialty Comments:  No specialty comments available.  Imaging: No results found.   PMFS History: Patient Active Problem List   Diagnosis Date Noted  . Carpal tunnel syndrome, left upper limb 12/31/2017  . Anorexia 12/09/2016  . Anemia 12/09/2016  . Constipation 08/22/2016  . Abdominal pain   . Acute lower GI bleeding   . Hematochezia   . Colon ulcer   . Gastrointestinal hemorrhage associated with intestinal diverticulitis 06/22/2016  . Barrett's esophagus 12/29/2013  . Adenomatous colon polyp 12/29/2013  . Iliac artery aneurysm ([place]) 11/26/2012  . Loss of weight 11/19/2012  . History of peptic ulcer disease 11/19/2012  . Abdominal pain, left  upper quadrant 11/19/2012  . Abdominal pain, epigastric 11/19/2012  . Elevated PSA, between 10 and less than 20 ng/ml 11/19/2012  . 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 DDD (degenerative disc disease), lumbar   . Diverticula of colon   . Hyperthyroidism   . Iliac artery aneurysm, bilateral (HCC)    with significant stenosis of right internal iliac artery  . Peptic ulcer   . Prostatitis   . Thyroid  disease     Family History  Problem Relation Age of Onset  . Hypertension Mother   . Heart disease Mother        before age 52  . Heart disease Father        before age 40  . Hyperlipidemia Sister   . Cancer Sister   . Colon cancer Neg Hx   . Liver disease Neg Hx     Past Surgical History:  Procedure Laterality Date  . BIOPSY  01/02/2017   Procedure: BIOPSY;  Surgeon: Daneil Dolin, MD;  [facility];  Service: Endoscopy;;  GASTRIC AND ESOPHAGEAL BIOPSIES  . COLONOSCOPY  April 2005   Dr. Irving Shows. Small internal hemorrhoids. Multiple small scattered diverticula in the cecum and descending colon.  . COLONOSCOPY N/A 01/18/2014   Procedure: COLONOSCOPY;  Surgeon: Daneil Dolin, MD;  [facility];  Service: Endoscopy;  Laterality: N/A;  1100  . COLONOSCOPY N/A 06/23/2016   Procedure: COLONOSCOPY;  Surgeon: Rogene Houston, MD;  [facility];  Service: Endoscopy;  Laterality: N/A;  . COLONOSCOPY WITH ESOPHAGOGASTRODUODENOSCOPY (EGD) N/A 12/16/2012   Dr. Gala Romney: rectal and colonic polyps with inadequate preparation, tubular adenoma. EGD with biopsy proven short segment Barrett's esophagus, hiatal hernia, mild chronic inactive gastritis   . COLONOSCOPY WITH PROPOFOL N/A 06/24/2016   Procedure: COLONOSCOPY WITH PROPOFOL;  Surgeon: Danie Binder, MD;  [facility];  Service: Endoscopy;  Laterality: N/A;  . COLONOSCOPY WITH PROPOFOL N/A 06/28/2016   Procedure: COLONOSCOPY WITH PROPOFOL;  Surgeon: Mauri Pole, MD;  Location: [facility];  Service: Endoscopy;  Laterality: N/A;  . ESOPHAGOGASTRODUODENOSCOPY  July 2005   Dr. Irving Shows grade 2 esophagitis at the GE junction. Acute gastritis. Multiple acute active, deep, irregular, friable ulcers in the antrum and body of the stomach. 2 active, deep, friable, irregular ulcers in the duodenum. Pathology not available.  . ESOPHAGOGASTRODUODENOSCOPY N/A 01/18/2014   Procedure:  ESOPHAGOGASTRODUODENOSCOPY (EGD);  Surgeon: Daneil Dolin, MD;  [facility];  Service: Endoscopy;  Laterality: N/A;  . ESOPHAGOGASTRODUODENOSCOPY (EGD) WITH PROPOFOL N/A 01/02/2017   Procedure: ESOPHAGOGASTRODUODENOSCOPY (EGD) WITH PROPOFOL;  Surgeon: Daneil Dolin, MD;  [facility];  Service: Endoscopy;  Laterality: N/A;  9:15am  . IR ANGIOGRAM VISCERAL SELECTIVE  06/26/2016  . IR ANGIOGRAM VISCERAL SELECTIVE  06/26/2016  . IR ANGIOGRAM VISCERAL SELECTIVE  06/26/2016  . IR US GUIDE VASC ACCESS RIGHT  06/26/2016  . NOSE SURGERY    . POLYPECTOMY  06/24/2016   Procedure: POLYPECTOMY;  Surgeon: Danie Binder, MD;  [facility];  Service: Endoscopy;;  cecal  . THYROIDECTOMY    . TONSILLECTOMY     Social History   Occupational History  . Not on file  Tobacco Use  . Smoking status: Former Smoker    Years: 25.00    Types: Cigars    Last attempt to  quit: 11/27/2006    Years since quitting: 11.1  . Smokeless tobacco: Never Used  . Tobacco comment: Quit in 2009  Substance and Sexual Activity  . Alcohol use: No    Alcohol/week: 0.0 standard drinks    Comment: former. weekend drinker (liqour) before, May 2015 last time he drank during a Therapist, nutritional.   . Drug use: Not Currently  . Sexual activity: Not on file

## 2018-02-25 IMAGING — CT CT CTA ABD/PEL W/CM AND/OR W/O CM
3 of 12 series · 11 of 46 positions shown, 17 images · IV contrast (Omni 300)
Comparison: 06/22/2016, 06/26/2016

CLINICAL DATA: Lower abdominal pain, acute lower GI bleeding

EXAM:
CT ANGIOGRAPHY ABDOMEN AND PELVIS
TECHNIQUE: Multidetector CT imaging of the abdomen and pelvis was performed
using the standard protocol during bolus administration of
intravenous contrast. Multiplanar reconstructed images including
MIPs were obtained and reviewed to evaluate the vascular anatomy.
CONTRAST:  100 cc Isovue 350

[Series 10: renal cta · axial · 0.91mm/px · z∈[-488,-200]mm · 6 of 162 slices shown]
[im 17/162  soft-tissue]
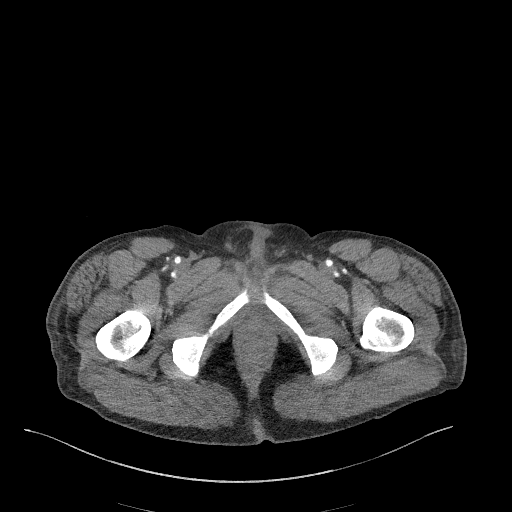
[im 33/162  soft-tissue]
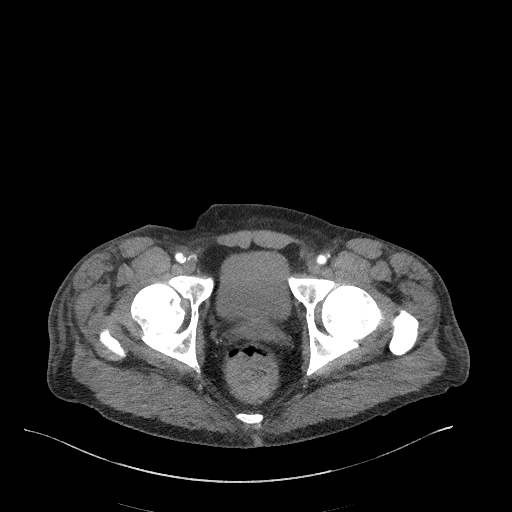
[im 49/162  soft-tissue]
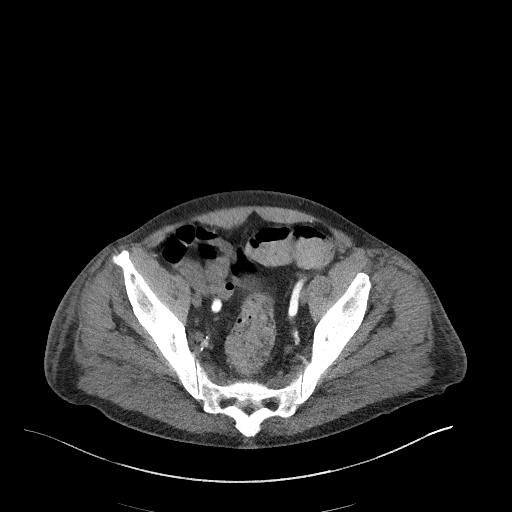
[im 65/162  soft-tissue]
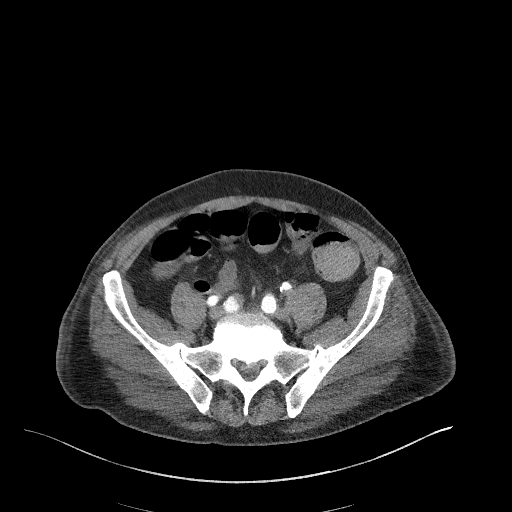
[im 97/162  soft-tissue]
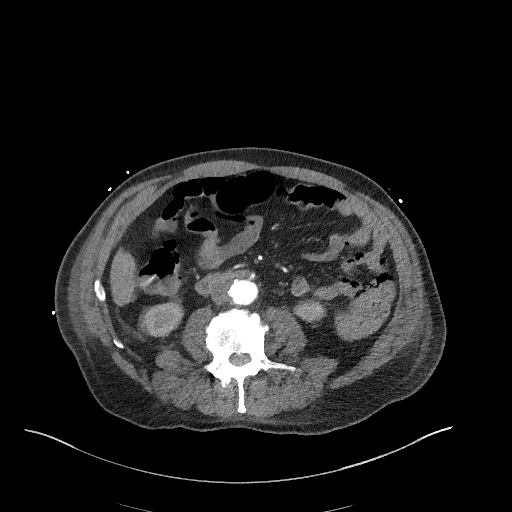
[im 113/162  soft-tissue]
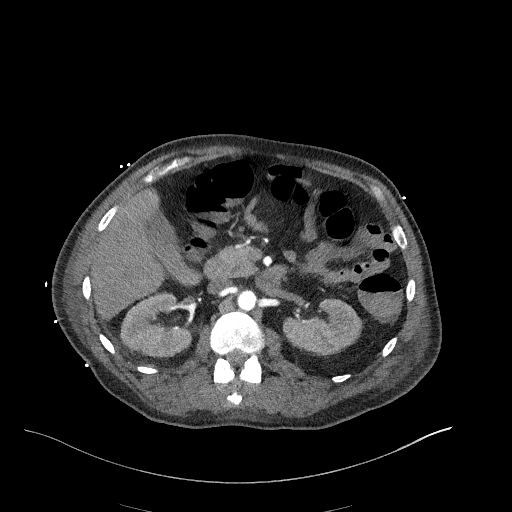

[Series 12: renal cta cor · coronal · 0.71mm/px · 1 of 151 slices shown, 2 images]
[im 76/151  soft-tissue]
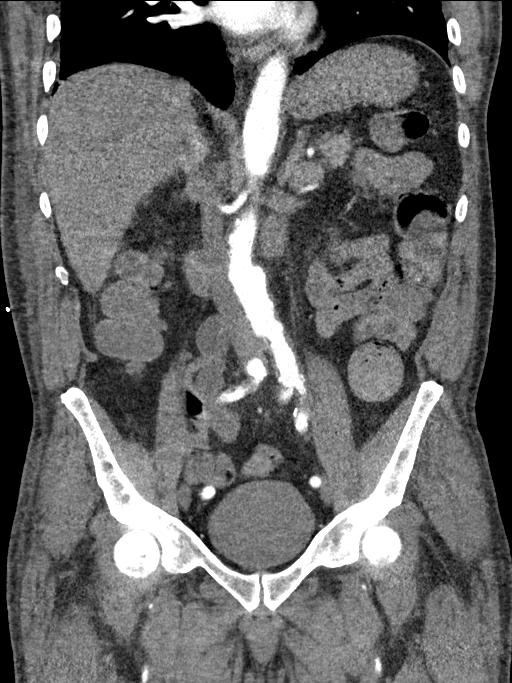
[im 76/151  bone]
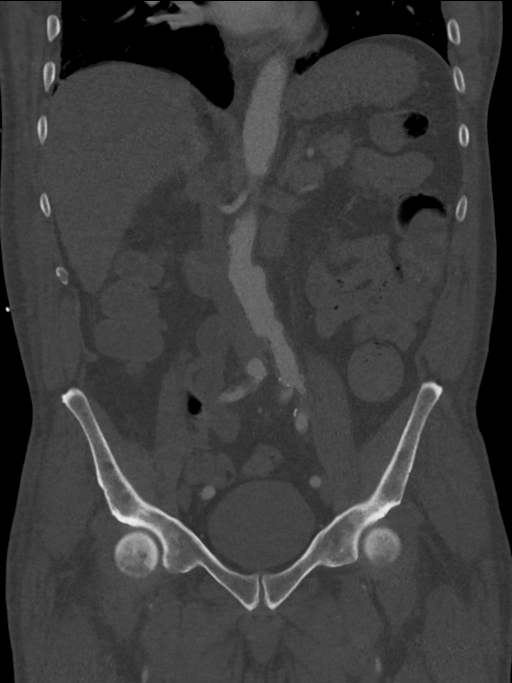

[Series 16: venous 5.0 · axial · portal-venous · 0.91mm/px · z∈[-438,-153]mm · 4 of 97 slices shown, 9 images]
[im 20/97  soft-tissue]
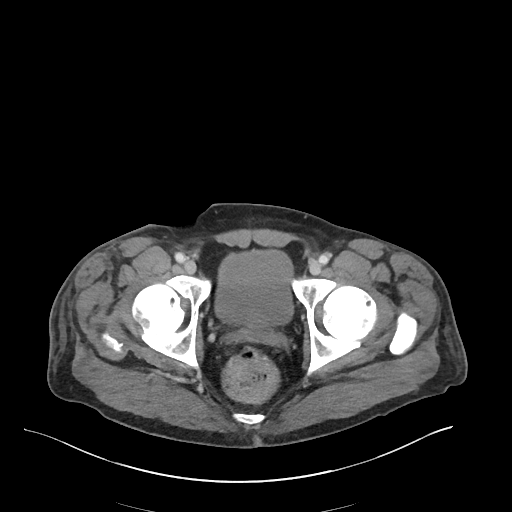
[im 20/97  lung]
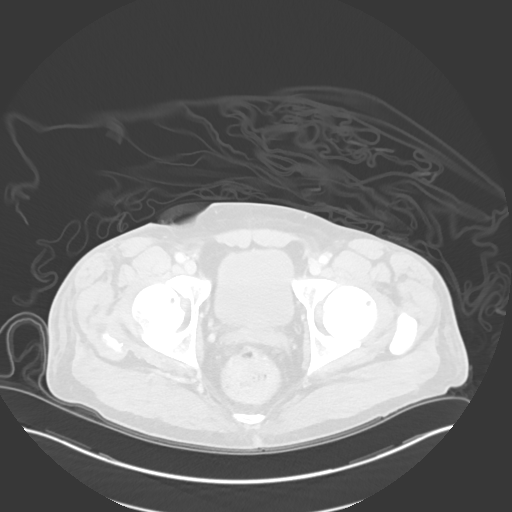
[im 20/97  bone]
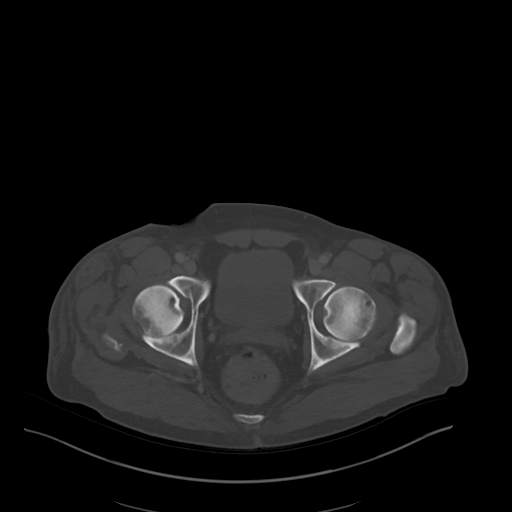
[im 39/97  soft-tissue]
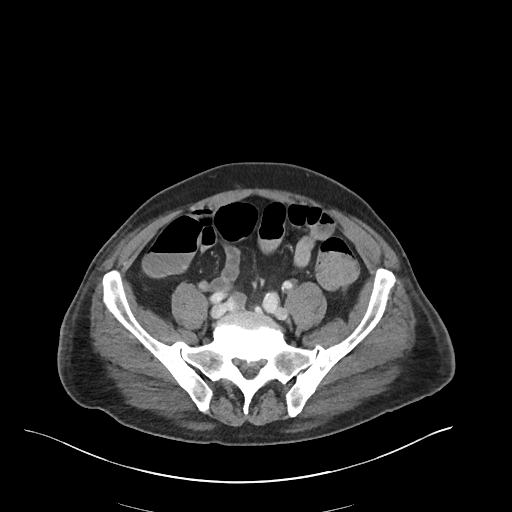
[im 39/97  lung]
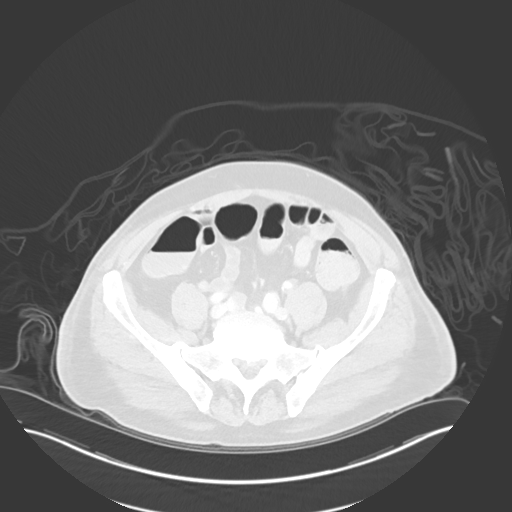
[im 58/97  soft-tissue]
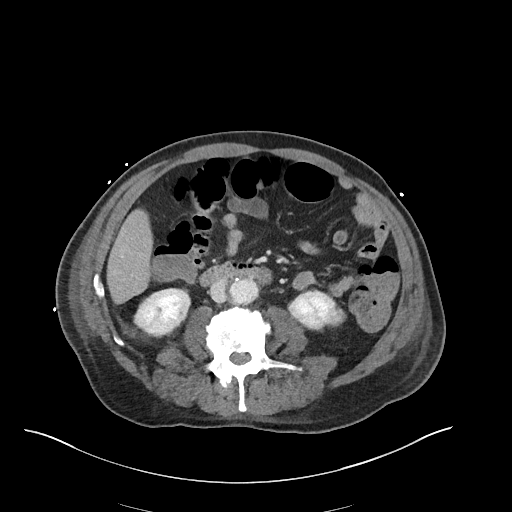
[im 58/97  lung]
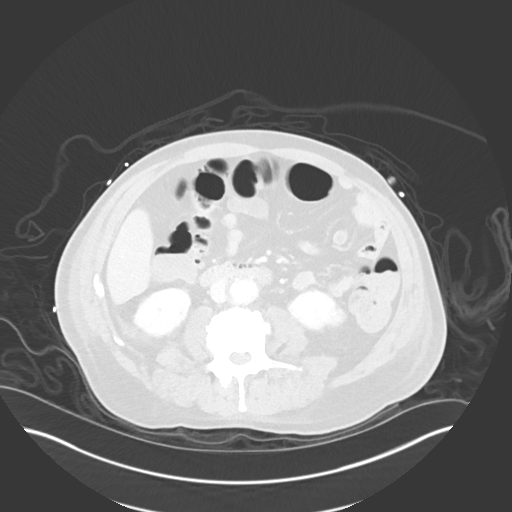
[im 77/97  soft-tissue]
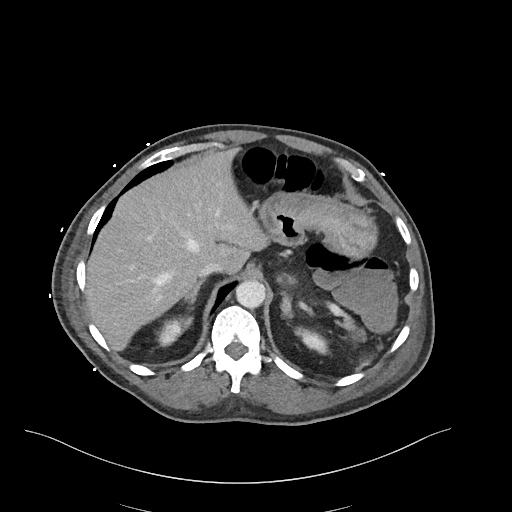
[im 77/97  lung]
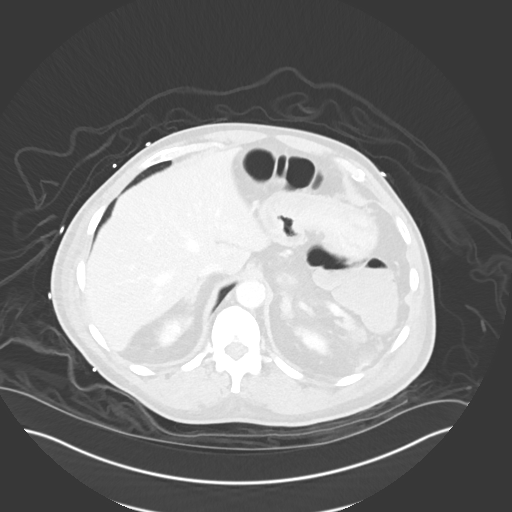

[11 of 46 positions shown; findings below may reference images not displayed]

FINDINGS: Arterial findings:

Aorta: Mild tortuosity and atherosclerotic changes of the abdominal
aorta. Infrarenal aorta maximal AP diameter 2.7 cm and transverse
2.5 cm. No occlusive process, acute dissection, or retroperitoneal
hemorrhage.

Celiac axis: Widely patent origin and branches. No acute vascular
finding or bleeding.

Superior mesenteric: Widely patent origin and branches. Replaced
right hepatic artery noted. No active contrast extravasation or
bleeding demonstrated.

Left renal:           Widely patent

Right renal:          Atherosclerotic changes but widely patent

Inferior mesenteric: Atherosclerotic origin but remains patent. No
active GI bleeding within the IMA vascular distribution.

Left iliac: Atherosclerotic changes and mild ectasia of the left
common iliac artery. Left common iliac diameter is 14 mm. Mild
aneurysmal dilatation of the left internal iliac artery measuring 13
mm in diameter, image 98. Left common, internal and external iliac
arteries remain patent. No occlusive process.

Right iliac: Atherosclerotic changes and aneurysmal dilatation of
the right common iliac artery, maximal diameter 2.2 cm, image 88.
Aneurysm dilatation also noted of the right internal iliac artery
measuring 16 mm, image 100. Right iliac vessels remain patent
without occlusive process.

Visualized common femoral, proximal profunda femoral, and proximal
superficial femoral arteries are also patent.

Venous findings: Hepatic, portal, splenic, and superior mesenteric
veins are all patent. IVC and renal veins are patent. Iliac and
proximal femoral veins appear patent.

No acute Karlo Kulasevic process.

Review of the MIP images confirms the above findings.

Nonvascular findings: Minimal dependent bibasilar atelectasis and
trace pleural effusions. Normal heart size. No pericardial effusion.

Liver, gallbladder, biliary system demonstrate no focal abnormality.
No biliary duct dilatation.

The pancreas demonstrates no focal abnormality, ductal dilatation or
surrounding inflammatory process.

Spleen is within normal limits.

Adrenal glands demonstrate no acute abnormality.

Kidneys demonstrate symmetric enhancement and excretion. No renal
obstruction or hydronephrosis. Small hypodense cortical cysts in the
right kidney mid and lower pole regions measuring 12 mm or less in
size.

Negative for bowel obstruction, significant dilatation, or free air.
No fluid collection or abscess. Negative for ascites. Again, no
evidence of active contrast extravasation within the bowel.

Prostate gland is enlarged. Seminal vesicles are symmetric. Urinary
bladder unremarkable.

No inguinal abnormality or hernia.  Intact abdominal wall.

Degenerative changes noted of the spine diffusely but most
pronounced from L2-S1.
IMPRESSION: Negative for significant active GI bleeding by CTA. Mesenteric
vessels remain patent.

Aortoiliac atherosclerosis with mild infrarenal aortic ectasia and
small iliac aneurysms noted. No vascular occlusive process.

No other acute intra-abdominal or pelvic finding.

Trace bibasilar atelectasis and pleural effusions.

## 2018-02-26 DIAGNOSIS — G5602 Carpal tunnel syndrome, left upper limb: Secondary | ICD-10-CM | POA: Diagnosis not present

## 2018-03-04 ENCOUNTER — Encounter (INDEPENDENT_AMBULATORY_CARE_PROVIDER_SITE_OTHER): Payer: Self-pay | Admitting: Orthopaedic Surgery

## 2018-03-04 ENCOUNTER — Ambulatory Visit (INDEPENDENT_AMBULATORY_CARE_PROVIDER_SITE_OTHER): Payer: Medicare HMO | Admitting: Orthopaedic Surgery

## 2018-03-04 VITALS — Ht 74.0 in | Wt 191.0 lb

## 2018-03-04 DIAGNOSIS — G5602 Carpal tunnel syndrome, left upper limb: Secondary | ICD-10-CM | POA: Diagnosis not present

## 2018-03-04 NOTE — Progress Notes (Signed)
Office Visit Note   Patient: Sean Leonard           Date of Birth: 07-28-1945           MRN: [id number] Visit Date: 03/04/2018              Requested by: Lavella Lemons, PA [place], [place] [address] PCP: Lavella Lemons, [place]   Assessment & Plan: Visit Diagnoses:  1. Carpal tunnel syndrome, left upper limb     Plan: 6 days status post left carpal tunnel release and doing quite well.  Does not have the burning or the pain that he had preoperatively.  Has not had to wake up and shake his hand.  Apply waterproof Band-Aid, volar splint and return in 1 week  Follow-Up Instructions: Return in about 1 week (around 03/11/2018).   Orders:  No orders of the defined types were placed in this encounter.  No orders of the defined types were placed in this encounter.     Procedures: No procedures performed   Clinical Data: No additional findings.   Subjective: Chief Complaint  Patient presents with  . Left Wrist - Follow-up, Routine Post Op    02/26/18 Left Carpal Tunnel Release  Patient presents for first post op visit. He is status post left carpal tunnel release on 02/26/18.  Doing well without any appreciable pain.  He notes that from his preoperative status he does not have the burning or pain in his hand that he had.  He has not had to wake up and shake his hand.  HPI  Review of Systems   Objective: Vital Signs: Ht 6\' 2"  (1.88 m)   Wt 191 lb (86.6 kg)   BMI 24.52 kg/m   Physical Exam  Ortho Exam dressing removed from left hand.  Carpal tunnel incision healing without problem.  Neurovascular exam intact.  Notes better subjective sensation to his fingers from his preoperative status.  Able to oppose thumb to little finger.  Have some arthritic change in his left hand so he may have to work at full range of motion he lacks about a fingerbreadth and a half from touching the tips of his fingers in the palm of his hand  Specialty Comments:  No specialty  comments available.  Imaging: No results found.   PMFS History: Patient Active Problem List   Diagnosis Date Noted  . Carpal tunnel syndrome, left upper limb 12/31/2017  . Anorexia 12/09/2016  . Anemia 12/09/2016  . Constipation 08/22/2016  . Abdominal pain   . Acute lower GI bleeding   . Hematochezia   . Colon ulcer   . Gastrointestinal hemorrhage associated with intestinal diverticulitis 06/22/2016  . Barrett's esophagus 12/29/2013  . Adenomatous colon polyp 12/29/2013  . Iliac artery aneurysm ([place]) 11/26/2012  . Loss of weight 11/19/2012  . History of peptic ulcer disease 11/19/2012  . Abdominal pain, left upper quadrant 11/19/2012  . Abdominal pain, epigastric 11/19/2012  . Elevated PSA, between 10 and less than 20 ng/ml 11/19/2012  . 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 DDD (degenerative disc disease), lumbar   . Diverticula of colon   . Hyperthyroidism   . Iliac artery aneurysm, bilateral ([facility]CC)    with significant stenosis of right internal iliac artery  . Peptic ulcer   . Prostatitis   . Thyroid disease  Family History  Problem Relation Age of Onset  . Hypertension Mother   . Heart disease Mother        before age 30  . Heart disease Father        before age 71  . Hyperlipidemia Sister   . Cancer Sister   . Colon cancer Neg Hx   . Liver disease Neg Hx     Past Surgical History:  Procedure Laterality Date  . BIOPSY  01/02/2017   Procedure: BIOPSY;  Surgeon: Daneil Dolin, MD;  [facility];  Service: Endoscopy;;  GASTRIC AND ESOPHAGEAL BIOPSIES  . COLONOSCOPY  April 2005   Dr. Irving Shows. Small internal hemorrhoids. Multiple small scattered diverticula in the cecum and descending colon.  . COLONOSCOPY N/A 01/18/2014   Procedure: COLONOSCOPY;  Surgeon: Daneil Dolin, MD;  [facility];  Service: Endoscopy;  Laterality:  N/A;  1100  . COLONOSCOPY N/A 06/23/2016   Procedure: COLONOSCOPY;  Surgeon: Rogene Houston, MD;  [facility];  Service: Endoscopy;  Laterality: N/A;  . COLONOSCOPY WITH ESOPHAGOGASTRODUODENOSCOPY (EGD) N/A 12/16/2012   Dr. Gala Romney: rectal and colonic polyps with inadequate preparation, tubular adenoma. EGD with biopsy proven short segment Barrett's esophagus, hiatal hernia, mild chronic inactive gastritis   . COLONOSCOPY WITH PROPOFOL N/A 06/24/2016   Procedure: COLONOSCOPY WITH PROPOFOL;  Surgeon: Danie Binder, MD;  [facility];  Service: Endoscopy;  Laterality: N/A;  . COLONOSCOPY WITH PROPOFOL N/A 06/28/2016   Procedure: COLONOSCOPY WITH PROPOFOL;  Surgeon: Mauri Pole, MD;  Location: [facility];  Service: Endoscopy;  Laterality: N/A;  . ESOPHAGOGASTRODUODENOSCOPY  July 2005   Dr. Irving Shows grade 2 esophagitis at the GE junction. Acute gastritis. Multiple acute active, deep, irregular, friable ulcers in the antrum and body of the stomach. 2 active, deep, friable, irregular ulcers in the duodenum. Pathology not available.  . ESOPHAGOGASTRODUODENOSCOPY N/A 01/18/2014   Procedure: ESOPHAGOGASTRODUODENOSCOPY (EGD);  Surgeon: Daneil Dolin, MD;  [facility];  Service: Endoscopy;  Laterality: N/A;  . ESOPHAGOGASTRODUODENOSCOPY (EGD) WITH PROPOFOL N/A 01/02/2017   Procedure: ESOPHAGOGASTRODUODENOSCOPY (EGD) WITH PROPOFOL;  Surgeon: Daneil Dolin, MD;  [facility];  Service: Endoscopy;  Laterality: N/A;  9:15am  . IR ANGIOGRAM VISCERAL SELECTIVE  06/26/2016  . IR ANGIOGRAM VISCERAL SELECTIVE  06/26/2016  . IR ANGIOGRAM VISCERAL SELECTIVE  06/26/2016  . IR US GUIDE VASC ACCESS RIGHT  06/26/2016  . NOSE SURGERY    . POLYPECTOMY  06/24/2016   Procedure: POLYPECTOMY;  Surgeon: Danie Binder, MD;  [facility];  Service: Endoscopy;;  cecal  . THYROIDECTOMY    . TONSILLECTOMY     Social History   Occupational History  . Not on file  Tobacco  Use  . Smoking status: Former Smoker    Years: 25.00    Types: Cigars    Last attempt to quit: 11/27/2006    Years since quitting: 11.2  . Smokeless tobacco: Never Used  . Tobacco comment: Quit in 2009  Substance and Sexual Activity  . Alcohol use: No    Alcohol/week: 0.0 standard drinks    Comment: former. weekend drinker (liqour) before, May 2015 last time he drank during a Therapist, nutritional.   . Drug use: Not Currently  . Sexual activity: Not on file

## 2018-03-11 DIAGNOSIS — Z6825 Body mass index (BMI) 25.0-25.9, adult: Secondary | ICD-10-CM | POA: Diagnosis not present

## 2018-03-11 DIAGNOSIS — M792 Neuralgia and neuritis, unspecified: Secondary | ICD-10-CM | POA: Diagnosis not present

## 2018-03-11 DIAGNOSIS — K219 Gastro-esophageal reflux disease without esophagitis: Secondary | ICD-10-CM | POA: Diagnosis not present

## 2018-03-11 DIAGNOSIS — M79645 Pain in left finger(s): Secondary | ICD-10-CM | POA: Diagnosis not present

## 2018-03-11 DIAGNOSIS — E782 Mixed hyperlipidemia: Secondary | ICD-10-CM | POA: Diagnosis not present

## 2018-03-11 DIAGNOSIS — E039 Hypothyroidism, unspecified: Secondary | ICD-10-CM | POA: Diagnosis not present

## 2018-03-12 ENCOUNTER — Encounter (INDEPENDENT_AMBULATORY_CARE_PROVIDER_SITE_OTHER): Payer: Self-pay | Admitting: Radiology

## 2018-03-12 ENCOUNTER — Ambulatory Visit (INDEPENDENT_AMBULATORY_CARE_PROVIDER_SITE_OTHER): Payer: Medicare HMO | Admitting: Radiology

## 2018-03-12 ENCOUNTER — Inpatient Hospital Stay (INDEPENDENT_AMBULATORY_CARE_PROVIDER_SITE_OTHER): Payer: Medicare HMO | Admitting: Orthopaedic Surgery

## 2018-03-12 VITALS — Ht 74.0 in | Wt 191.0 lb

## 2018-03-12 DIAGNOSIS — G5602 Carpal tunnel syndrome, left upper limb: Secondary | ICD-10-CM

## 2018-03-12 NOTE — Progress Notes (Signed)
Patient presents to [facility] office for suture removal. He is status post left carpal tunnel release on 02/26/18.  Sutures are removed and steri strips are applied. Incision looks good. Patient is not experiencing any problems.

## 2018-04-01 ENCOUNTER — Encounter (INDEPENDENT_AMBULATORY_CARE_PROVIDER_SITE_OTHER): Payer: Self-pay | Admitting: Orthopaedic Surgery

## 2018-04-01 ENCOUNTER — Ambulatory Visit (INDEPENDENT_AMBULATORY_CARE_PROVIDER_SITE_OTHER): Payer: Medicare HMO | Admitting: Orthopaedic Surgery

## 2018-04-01 VITALS — BP 122/70 | HR 66 | Ht 74.0 in | Wt 191.0 lb

## 2018-04-01 DIAGNOSIS — G5602 Carpal tunnel syndrome, left upper limb: Secondary | ICD-10-CM

## 2018-04-01 NOTE — Progress Notes (Signed)
Office Visit Note   Patient: Sean Leonard           Date of Birth: Feb 02, 1946           MRN: [id number] Visit Date: 04/01/2018              Requested by: Lavella Lemons, PA [place], [place] [id number] PCP: Lavella Lemons, [place]   Assessment & Plan: Visit Diagnoses:  1. Carpal tunnel syndrome, left upper limb     Plan: Month status post left carpal tunnel release and doing well.  Occasionally having some "stinging" but no longer has the problem at night having to wake up and not having any burning.  Having significantly better feeling in the tips of his fingers.  Will massage the scar with med derma.  Discontinue the splint and return to see me in a month.  He is retired  Follow-Up Instructions: Return in about 1 month (around 05/02/2018).   Orders:  No orders of the defined types were placed in this encounter.  No orders of the defined types were placed in this encounter.     Procedures: No procedures performed   Clinical Data: No additional findings.   Subjective: Chief Complaint  Patient presents with  . Left Hand - Follow-up    02/26/18 Left Carpal Tunnel Release  Patient returns for follow up. He is status post left carpal tunnel release on 02/26/18.  He states that the hand stings just a little on the inside.  He is not taking anything for pain.  HPI  Review of Systems   Objective: Vital Signs: BP 122/70   Pulse 66   Ht 6\' 2"  (1.88 m)   Wt 191 lb (86.6 kg)   BMI 24.52 kg/m   Physical Exam  Ortho Exam left hand carpal tunnel incision healing without problem.  Good opposition of thumb to little finger.  Good capillary refill to fingers.  No edema.  Some tenderness along the scar but no evidence of any complications.  Still having a little decreased sensibility along the long finger much better than before surgery  Specialty Comments:  No specialty comments available.  Imaging: No results found.   PMFS History: Patient Active Problem  List   Diagnosis Date Noted  . Carpal tunnel syndrome, left upper limb 12/31/2017  . Anorexia 12/09/2016  . Anemia 12/09/2016  . Constipation 08/22/2016  . Abdominal pain   . Acute lower GI bleeding   . Hematochezia   . Colon ulcer   . Gastrointestinal hemorrhage associated with intestinal diverticulitis 06/22/2016  . Barrett's esophagus 12/29/2013  . Adenomatous colon polyp 12/29/2013  . Iliac artery aneurysm ([place]) 11/26/2012  . Loss of weight 11/19/2012  . History of peptic ulcer disease 11/19/2012  . Abdominal pain, left upper quadrant 11/19/2012  . Abdominal pain, epigastric 11/19/2012  . Elevated PSA, between 10 and less than 20 ng/ml 11/19/2012  . Iliac artery stenosis, right ([place]) 11/19/2012  . Aneurysm of internal iliac artery ([place]) 11/19/2012   Past Medical History:  Diagnosis Date  . DDD (degenerative disc disease), lumbar   . Diverticula of colon   . Hyperthyroidism   . Iliac artery aneurysm, bilateral ([facility])    with significant stenosis of right internal iliac artery  . Peptic ulcer   . Prostatitis   . Thyroid disease     Family History  Problem Relation Age of Onset  . Hypertension Mother   . Heart disease Mother  before age 35  . Heart disease Father        before age 32  . Hyperlipidemia Sister   . Cancer Sister   . Colon cancer Neg Hx   . Liver disease Neg Hx     Past Surgical History:  Procedure Laterality Date  . BIOPSY  01/02/2017   Procedure: BIOPSY;  Surgeon: Daneil Dolin, MD;  [facility];  Service: Endoscopy;;  GASTRIC AND ESOPHAGEAL BIOPSIES  . COLONOSCOPY  April 2005   Dr. Irving Shows. Small internal hemorrhoids. Multiple small scattered diverticula in the cecum and descending colon.  . COLONOSCOPY N/A 01/18/2014   Procedure: COLONOSCOPY;  Surgeon: Daneil Dolin, MD;  [facility];  Service: Endoscopy;  Laterality: N/A;  1100  . COLONOSCOPY N/A 06/23/2016   Procedure: COLONOSCOPY;  Surgeon: Rogene Houston,  MD;  [facility];  Service: Endoscopy;  Laterality: N/A;  . COLONOSCOPY WITH ESOPHAGOGASTRODUODENOSCOPY (EGD) N/A 12/16/2012   Dr. Gala Romney: rectal and colonic polyps with inadequate preparation, tubular adenoma. EGD with biopsy proven short segment Barrett's esophagus, hiatal hernia, mild chronic inactive gastritis   . COLONOSCOPY WITH PROPOFOL N/A 06/24/2016   Procedure: COLONOSCOPY WITH PROPOFOL;  Surgeon: Danie Binder, MD;  [facility];  Service: Endoscopy;  Laterality: N/A;  . COLONOSCOPY WITH PROPOFOL N/A 06/28/2016   Procedure: COLONOSCOPY WITH PROPOFOL;  Surgeon: Mauri Pole, MD;  Location: [facility];  Service: Endoscopy;  Laterality: N/A;  . ESOPHAGOGASTRODUODENOSCOPY  July 2005   Dr. Irving Shows grade 2 esophagitis at the GE junction. Acute gastritis. Multiple acute active, deep, irregular, friable ulcers in the antrum and body of the stomach. 2 active, deep, friable, irregular ulcers in the duodenum. Pathology not available.  . ESOPHAGOGASTRODUODENOSCOPY N/A 01/18/2014   Procedure: ESOPHAGOGASTRODUODENOSCOPY (EGD);  Surgeon: Daneil Dolin, MD;  [facility];  Service: Endoscopy;  Laterality: N/A;  . ESOPHAGOGASTRODUODENOSCOPY (EGD) WITH PROPOFOL N/A 01/02/2017   Procedure: ESOPHAGOGASTRODUODENOSCOPY (EGD) WITH PROPOFOL;  Surgeon: Daneil Dolin, MD;  [facility];  Service: Endoscopy;  Laterality: N/A;  9:15am  . IR ANGIOGRAM VISCERAL SELECTIVE  06/26/2016  . IR ANGIOGRAM VISCERAL SELECTIVE  06/26/2016  . IR ANGIOGRAM VISCERAL SELECTIVE  06/26/2016  . IR US GUIDE VASC ACCESS RIGHT  06/26/2016  . NOSE SURGERY    . POLYPECTOMY  06/24/2016   Procedure: POLYPECTOMY;  Surgeon: Danie Binder, MD;  [facility];  Service: Endoscopy;;  cecal  . THYROIDECTOMY    . TONSILLECTOMY     Social History   Occupational History  . Not on file  Tobacco Use  . Smoking status: Former Smoker    Years: 25.00    Types: Cigars    Last attempt to  quit: 11/27/2006    Years since quitting: 11.3  . Smokeless tobacco: Never Used  . Tobacco comment: Quit in 2009  Substance and Sexual Activity  . Alcohol use: No    Alcohol/week: 0.0 standard drinks    Comment: former. weekend drinker (liqour) before, May 2015 last time he drank during a Therapist, nutritional.   . Drug use: Not Currently  . Sexual activity: Not on file

## 2018-04-29 ENCOUNTER — Encounter (INDEPENDENT_AMBULATORY_CARE_PROVIDER_SITE_OTHER): Payer: Self-pay | Admitting: Orthopaedic Surgery

## 2018-04-29 ENCOUNTER — Ambulatory Visit (INDEPENDENT_AMBULATORY_CARE_PROVIDER_SITE_OTHER): Payer: Medicare HMO | Admitting: Orthopaedic Surgery

## 2018-04-29 VITALS — BP 119/80 | HR 85 | Ht 74.0 in | Wt 194.0 lb

## 2018-04-29 DIAGNOSIS — G5602 Carpal tunnel syndrome, left upper limb: Secondary | ICD-10-CM

## 2018-04-29 DIAGNOSIS — Z4789 Encounter for other orthopedic aftercare: Secondary | ICD-10-CM

## 2018-04-29 NOTE — Progress Notes (Signed)
Office Visit Note   Patient: Sean Leonard           Date of Birth: 1945/09/21           MRN: [id number] Visit Date: 04/29/2018              Requested by: Lavella Lemons, PA [place], [place] [id number] PCP: Lavella Lemons, [place]   Assessment & Plan: Visit Diagnoses:  1. Carpal tunnel syndrome, left upper limb     Plan: 2 months status post left carpal tunnel release.  Still having a little numbness at the tip of the index and long finger which he had preoperatively.  Otherwise sensation feels better.  Is not having to wake up at night and shake his hand.  Motor exams intact.  Still has little sensitivity about the incision but is applying "ointment" we will plan to see him back in a month.  Definitely improving  Follow-Up Instructions: Return in about 1 month (around 05/28/2018).   Orders:  No orders of the defined types were placed in this encounter.  No orders of the defined types were placed in this encounter.     Procedures: No procedures performed   Clinical Data: No additional findings.   Subjective: Chief Complaint  Patient presents with  . Left Hand - Follow-up    02/26/18 Left Carpal Tunnel Release  Patient returns for one month follow up. He is status post left carpal tunnel release on 02/26/18. He states that he is doing much better. He continues to have some soreness around the incision area. He also still notices numbness on the tips of the index and middle left fingers. He does note that this is better. He denies taking anything for pain.  HPI  Review of Systems   Objective: Vital Signs: BP 119/80   Pulse 85   Ht 6\' 2"  (1.88 m)   Wt 194 lb (88 kg)   BMI 24.91 kg/m   Physical Exam  Ortho Exam and exam with a well-healed carpal tunnel incision.  Able to oppose thumb to little finger.  Some decreased sensibility the tip of the index and long finger.  Good capillary refill.  Just a little bit of soreness along the scar with there is some  induration but no redness or drainage.  Showing no evidence of infection.  No swelling of the hand or digits  Specialty Comments:  No specialty comments available.  Imaging: No results found.   PMFS History: Patient Active Problem List   Diagnosis Date Noted  . Carpal tunnel syndrome, left upper limb 12/31/2017  . Anorexia 12/09/2016  . Anemia 12/09/2016  . Constipation 08/22/2016  . Abdominal pain   . Acute lower GI bleeding   . Hematochezia   . Colon ulcer   . Gastrointestinal hemorrhage associated with intestinal diverticulitis 06/22/2016  . Barrett's esophagus 12/29/2013  . Adenomatous colon polyp 12/29/2013  . Iliac artery aneurysm ([place]) 11/26/2012  . Loss of weight 11/19/2012  . History of peptic ulcer disease 11/19/2012  . Abdominal pain, left upper quadrant 11/19/2012  . Abdominal pain, epigastric 11/19/2012  . Elevated PSA, between 10 and less than 20 ng/ml 11/19/2012  . Iliac artery stenosis, right ([place]) 11/19/2012  . Aneurysm of internal iliac artery ([place]) 11/19/2012   Past Medical History:  Diagnosis Date  . DDD (degenerative disc disease), lumbar   . Diverticula of colon   . Hyperthyroidism   . Iliac artery aneurysm, bilateral ([place])  with significant stenosis of right internal iliac artery  . Peptic ulcer   . Prostatitis   . Thyroid disease     Family History  Problem Relation Age of Onset  . Hypertension Mother   . Heart disease Mother        before age 85  . Heart disease Father        before age 14  . Hyperlipidemia Sister   . Cancer Sister   . Colon cancer Neg Hx   . Liver disease Neg Hx     Past Surgical History:  Procedure Laterality Date  . BIOPSY  01/02/2017   Procedure: BIOPSY;  Surgeon: Daneil Dolin, MD;  [facility];  Service: Endoscopy;;  GASTRIC AND ESOPHAGEAL BIOPSIES  . COLONOSCOPY  April 2005   Dr. Irving Shows. Small internal hemorrhoids. Multiple small scattered diverticula in the cecum and descending colon.    . COLONOSCOPY N/A 01/18/2014   Procedure: COLONOSCOPY;  Surgeon: Daneil Dolin, MD;  [facility];  Service: Endoscopy;  Laterality: N/A;  1100  . COLONOSCOPY N/A 06/23/2016   Procedure: COLONOSCOPY;  Surgeon: Rogene Houston, MD;  [facility];  Service: Endoscopy;  Laterality: N/A;  . COLONOSCOPY WITH ESOPHAGOGASTRODUODENOSCOPY (EGD) N/A 12/16/2012   Dr. Gala Romney: rectal and colonic polyps with inadequate preparation, tubular adenoma. EGD with biopsy proven short segment Barrett's esophagus, hiatal hernia, mild chronic inactive gastritis   . COLONOSCOPY WITH PROPOFOL N/A 06/24/2016   Procedure: COLONOSCOPY WITH PROPOFOL;  Surgeon: Danie Binder, MD;  [facility];  Service: Endoscopy;  Laterality: N/A;  . COLONOSCOPY WITH PROPOFOL N/A 06/28/2016   Procedure: COLONOSCOPY WITH PROPOFOL;  Surgeon: Mauri Pole, MD;  Location: [facility];  Service: Endoscopy;  Laterality: N/A;  . ESOPHAGOGASTRODUODENOSCOPY  July 2005   Dr. Irving Shows grade 2 esophagitis at the GE junction. Acute gastritis. Multiple acute active, deep, irregular, friable ulcers in the antrum and body of the stomach. 2 active, deep, friable, irregular ulcers in the duodenum. Pathology not available.  . ESOPHAGOGASTRODUODENOSCOPY N/A 01/18/2014   Procedure: ESOPHAGOGASTRODUODENOSCOPY (EGD);  Surgeon: Daneil Dolin, MD;  [facility];  Service: Endoscopy;  Laterality: N/A;  . ESOPHAGOGASTRODUODENOSCOPY (EGD) WITH PROPOFOL N/A 01/02/2017   Procedure: ESOPHAGOGASTRODUODENOSCOPY (EGD) WITH PROPOFOL;  Surgeon: Daneil Dolin, MD;  [facility];  Service: Endoscopy;  Laterality: N/A;  9:15am  . IR ANGIOGRAM VISCERAL SELECTIVE  06/26/2016  . IR ANGIOGRAM VISCERAL SELECTIVE  06/26/2016  . IR ANGIOGRAM VISCERAL SELECTIVE  06/26/2016  . IR US GUIDE VASC ACCESS RIGHT  06/26/2016  . NOSE SURGERY    . POLYPECTOMY  06/24/2016   Procedure: POLYPECTOMY;  Surgeon: Danie Binder, MD;  [facility];  Service: Endoscopy;;  cecal  . THYROIDECTOMY    . TONSILLECTOMY     Social History   Occupational History  . Not on file  Tobacco Use  . Smoking status: Former Smoker    Years: 25.00    Types: Cigars    Last attempt to quit: 11/27/2006    Years since quitting: 11.4  . Smokeless tobacco: Never Used  . Tobacco comment: Quit in 2009  Substance and Sexual Activity  . Alcohol use: No    Alcohol/week: 0.0 standard drinks    Comment: former. weekend drinker (liqour) before, May 2015 last time he drank during a Therapist, nutritional.   . Drug use: Not Currently  . Sexual activity: Not on file

## 2018-06-05 ENCOUNTER — Other Ambulatory Visit: Payer: Self-pay

## 2018-06-05 DIAGNOSIS — I723 Aneurysm of iliac artery: Secondary | ICD-10-CM

## 2018-06-10 ENCOUNTER — Other Ambulatory Visit: Payer: Self-pay

## 2018-06-10 ENCOUNTER — Encounter (INDEPENDENT_AMBULATORY_CARE_PROVIDER_SITE_OTHER): Payer: Self-pay | Admitting: Orthopaedic Surgery

## 2018-06-10 ENCOUNTER — Ambulatory Visit (INDEPENDENT_AMBULATORY_CARE_PROVIDER_SITE_OTHER): Payer: Medicare HMO | Admitting: Orthopaedic Surgery

## 2018-06-10 VITALS — BP 134/87 | HR 74 | Ht 74.0 in | Wt 194.0 lb

## 2018-06-10 DIAGNOSIS — G5602 Carpal tunnel syndrome, left upper limb: Secondary | ICD-10-CM | POA: Diagnosis not present

## 2018-06-10 NOTE — Progress Notes (Signed)
Office Visit Note   Patient: Sean Leonard           Date of Birth: 03-25-1946           MRN: [id number] Visit Date: 06/10/2018              Requested by: Lavella Lemons, PA [place], [place] [id number] PCP: Lavella Lemons, [place]   Assessment & Plan: Visit Diagnoses:  1. Carpal tunnel syndrome, left upper limb     Plan: 3-1/2 months status post left carpal tunnel release and doing quite well.  Still has a little residual tingling in the tips of his fingers and occasional pain over the incision but he relates that he is much better in terms of pain and feeling and no trouble sleeping.  Continue with his exercises and return to see me as needed  Follow-Up Instructions: Return if symptoms worsen or fail to improve.   Orders:  No orders of the defined types were placed in this encounter.  No orders of the defined types were placed in this encounter.     Procedures: No procedures performed   Clinical Data: No additional findings.   Subjective: Chief Complaint  Patient presents with  . Left Wrist - Routine Post Op    Left carpal tunnel release DOS 02/26/18  Patient presents today for a 6 week follow up. He had left carpal tunnel release on 02/26/18. He is experiencing a stinging sensation along the incision and has some numbness in his fingers. He does state the he is "a whole lot better than it was".  HPI  Review of Systems   Objective: Vital Signs: BP 134/87   Pulse 74   Ht 6\' 2"  (1.88 m)   Wt 194 lb (88 kg)   BMI 24.91 kg/m   Physical Exam  Ortho Exam full range of motion of digits left hand.  Incision has healed from the carpal tunnel surgery.  There is no induration or redness.  No significant pain.  Still has some slight decreased sensibility the very tips of his fingers but notes that he has reasonable strength in.  He is able to oppose thumb to little finger.  Good capillary refill.  Specialty Comments:  No specialty comments available.   Imaging: No results found.   PMFS History: Patient Active Problem List   Diagnosis Date Noted  . Carpal tunnel syndrome, left upper limb 12/31/2017  . Anorexia 12/09/2016  . Anemia 12/09/2016  . Constipation 08/22/2016  . Abdominal pain   . Acute lower GI bleeding   . Hematochezia   . Colon ulcer   . Gastrointestinal hemorrhage associated with intestinal diverticulitis 06/22/2016  . Barrett's esophagus 12/29/2013  . Adenomatous colon polyp 12/29/2013  . Iliac artery aneurysm ([place]) 11/26/2012  . Loss of weight 11/19/2012  . History of peptic ulcer disease 11/19/2012  . Abdominal pain, left upper quadrant 11/19/2012  . Abdominal pain, epigastric 11/19/2012  . Elevated PSA, between 10 and less than 20 ng/ml 11/19/2012  . Iliac artery stenosis, right ([place]) 11/19/2012  . Aneurysm of internal iliac artery ([place]) 11/19/2012   Past Medical History:  Diagnosis Date  . DDD (degenerative disc disease), lumbar   . Diverticula of colon   . Hyperthyroidism   . Iliac artery aneurysm, bilateral ([facility])    with significant stenosis of right internal iliac artery  . Peptic ulcer   . Prostatitis   . Thyroid disease     Family History  Problem Relation Age of Onset  . Hypertension Mother   . Heart disease Mother        before age 72  . Heart disease Father        before age 64  . Hyperlipidemia Sister   . Cancer Sister   . Colon cancer Neg Hx   . Liver disease Neg Hx     Past Surgical History:  Procedure Laterality Date  . BIOPSY  01/02/2017   Procedure: BIOPSY;  Surgeon: Daneil Dolin, MD;  [facility];  Service: Endoscopy;;  GASTRIC AND ESOPHAGEAL BIOPSIES  . COLONOSCOPY  April 2005   Dr. Irving Shows. Small internal hemorrhoids. Multiple small scattered diverticula in the cecum and descending colon.  . COLONOSCOPY N/A 01/18/2014   Procedure: COLONOSCOPY;  Surgeon: Daneil Dolin, MD;  [facility];  Service: Endoscopy;  Laterality: N/A;  1100  .  COLONOSCOPY N/A 06/23/2016   Procedure: COLONOSCOPY;  Surgeon: Rogene Houston, MD;  [facility];  Service: Endoscopy;  Laterality: N/A;  . COLONOSCOPY WITH ESOPHAGOGASTRODUODENOSCOPY (EGD) N/A 12/16/2012   Dr. Gala Romney: rectal and colonic polyps with inadequate preparation, tubular adenoma. EGD with biopsy proven short segment Barrett's esophagus, hiatal hernia, mild chronic inactive gastritis   . COLONOSCOPY WITH PROPOFOL N/A 06/24/2016   Procedure: COLONOSCOPY WITH PROPOFOL;  Surgeon: Danie Binder, MD;  [facility];  Service: Endoscopy;  Laterality: N/A;  . COLONOSCOPY WITH PROPOFOL N/A 06/28/2016   Procedure: COLONOSCOPY WITH PROPOFOL;  Surgeon: Mauri Pole, MD;  Location: [facility];  Service: Endoscopy;  Laterality: N/A;  . ESOPHAGOGASTRODUODENOSCOPY  July 2005   Dr. Irving Shows grade 2 esophagitis at the GE junction. Acute gastritis. Multiple acute active, deep, irregular, friable ulcers in the antrum and body of the stomach. 2 active, deep, friable, irregular ulcers in the duodenum. Pathology not available.  . ESOPHAGOGASTRODUODENOSCOPY N/A 01/18/2014   Procedure: ESOPHAGOGASTRODUODENOSCOPY (EGD);  Surgeon: Daneil Dolin, MD;  [facility];  Service: Endoscopy;  Laterality: N/A;  . ESOPHAGOGASTRODUODENOSCOPY (EGD) WITH PROPOFOL N/A 01/02/2017   Procedure: ESOPHAGOGASTRODUODENOSCOPY (EGD) WITH PROPOFOL;  Surgeon: Daneil Dolin, MD;  [facility];  Service: Endoscopy;  Laterality: N/A;  9:15am  . IR ANGIOGRAM VISCERAL SELECTIVE  06/26/2016  . IR ANGIOGRAM VISCERAL SELECTIVE  06/26/2016  . IR ANGIOGRAM VISCERAL SELECTIVE  06/26/2016  . IR US GUIDE VASC ACCESS RIGHT  06/26/2016  . NOSE SURGERY    . POLYPECTOMY  06/24/2016   Procedure: POLYPECTOMY;  Surgeon: Danie Binder, MD;  [facility];  Service: Endoscopy;;  cecal  . THYROIDECTOMY    . TONSILLECTOMY     Social History   Occupational History  . Not on file  Tobacco Use  .  Smoking status: Former Smoker    Years: 25.00    Types: Cigars    Last attempt to quit: 11/27/2006    Years since quitting: 11.5  . Smokeless tobacco: Never Used  . Tobacco comment: Quit in 2009  Substance and Sexual Activity  . Alcohol use: No    Alcohol/week: 0.0 standard drinks    Comment: former. weekend drinker (liqour) before, May 2015 last time he drank during a Therapist, nutritional.   . Drug use: Not Currently  . Sexual activity: Not on file

## 2018-07-09 DIAGNOSIS — Z6824 Body mass index (BMI) 24.0-24.9, adult: Secondary | ICD-10-CM | POA: Diagnosis not present

## 2018-07-09 DIAGNOSIS — E782 Mixed hyperlipidemia: Secondary | ICD-10-CM | POA: Diagnosis not present

## 2018-07-09 DIAGNOSIS — M792 Neuralgia and neuritis, unspecified: Secondary | ICD-10-CM | POA: Diagnosis not present

## 2018-07-09 DIAGNOSIS — K219 Gastro-esophageal reflux disease without esophagitis: Secondary | ICD-10-CM | POA: Diagnosis not present

## 2018-07-09 DIAGNOSIS — M79645 Pain in left finger(s): Secondary | ICD-10-CM | POA: Diagnosis not present

## 2018-07-09 DIAGNOSIS — E039 Hypothyroidism, unspecified: Secondary | ICD-10-CM | POA: Diagnosis not present

## 2018-07-09 DIAGNOSIS — R220 Localized swelling, mass and lump, head: Secondary | ICD-10-CM | POA: Diagnosis not present

## 2018-07-14 ENCOUNTER — Ambulatory Visit: Payer: Medicare HMO | Admitting: Vascular Surgery

## 2018-07-15 ENCOUNTER — Other Ambulatory Visit: Payer: Self-pay | Admitting: Physician Assistant

## 2018-07-15 ENCOUNTER — Other Ambulatory Visit (HOSPITAL_COMMUNITY): Payer: Self-pay | Admitting: Physician Assistant

## 2018-07-15 DIAGNOSIS — K118 Other diseases of salivary glands: Secondary | ICD-10-CM

## 2018-07-20 ENCOUNTER — Ambulatory Visit (HOSPITAL_COMMUNITY)
Admission: RE | Admit: 2018-07-20 | Discharge: 2018-07-20 | Disposition: A | Discharge status: Home / Self Care | Payer: Medicare HMO | Source: Ambulatory Visit | Attending: Physician Assistant | Admitting: Physician Assistant

## 2018-07-20 ENCOUNTER — Other Ambulatory Visit: Payer: Self-pay

## 2018-07-20 DIAGNOSIS — K118 Other diseases of salivary glands: Secondary | ICD-10-CM | POA: Insufficient documentation

## 2018-07-20 DIAGNOSIS — K1379 Other lesions of oral mucosa: Secondary | ICD-10-CM | POA: Diagnosis not present

## 2018-07-20 MED ORDER — IOHEXOL 300 MG/ML  SOLN
75.0000 mL | Freq: Once | INTRAMUSCULAR | Status: AC | PRN
  Administered 2018-07-20: 16:00:00 75 mL via INTRAVENOUS

## 2018-07-27 ENCOUNTER — Ambulatory Visit (INDEPENDENT_AMBULATORY_CARE_PROVIDER_SITE_OTHER): Payer: Medicare HMO | Admitting: Otolaryngology

## 2018-07-27 ENCOUNTER — Other Ambulatory Visit (INDEPENDENT_AMBULATORY_CARE_PROVIDER_SITE_OTHER): Payer: Self-pay | Admitting: Otolaryngology

## 2018-07-27 DIAGNOSIS — D3704 Neoplasm of uncertain behavior of the minor salivary glands: Secondary | ICD-10-CM

## 2018-07-27 DIAGNOSIS — K1379 Other lesions of oral mucosa: Secondary | ICD-10-CM | POA: Diagnosis not present

## 2018-08-13 ENCOUNTER — Other Ambulatory Visit: Payer: Self-pay | Admitting: Otolaryngology

## 2018-08-13 ENCOUNTER — Other Ambulatory Visit: Payer: Self-pay

## 2018-08-13 ENCOUNTER — Encounter (HOSPITAL_BASED_OUTPATIENT_CLINIC_OR_DEPARTMENT_OTHER): Payer: Self-pay

## 2018-08-13 ENCOUNTER — Ambulatory Visit (INDEPENDENT_AMBULATORY_CARE_PROVIDER_SITE_OTHER): Payer: Medicare HMO | Admitting: Otolaryngology

## 2018-08-13 DIAGNOSIS — D3704 Neoplasm of uncertain behavior of the minor salivary glands: Secondary | ICD-10-CM

## 2018-08-13 NOTE — Progress Notes (Signed)
Chart reviewed with Dr Royce Macadamia due to iliac artery aneurysm, continue with surgery as scheduled.

## 2018-08-18 ENCOUNTER — Other Ambulatory Visit: Payer: Self-pay

## 2018-08-18 ENCOUNTER — Other Ambulatory Visit (HOSPITAL_COMMUNITY)
Admission: RE | Admit: 2018-08-18 | Discharge: 2018-08-18 | Disposition: A | Discharge status: Home / Self Care | Payer: Medicare HMO | Source: Ambulatory Visit | Attending: Otolaryngology | Admitting: Otolaryngology

## 2018-08-18 DIAGNOSIS — Z1159 Encounter for screening for other viral diseases: Secondary | ICD-10-CM | POA: Diagnosis not present

## 2018-08-18 LAB — SARS CORONAVIRUS 2 BY RT PCR (HOSPITAL ORDER, PERFORMED IN ~~LOC~~ HOSPITAL LAB): SARS Coronavirus 2: NEGATIVE

## 2018-08-19 ENCOUNTER — Other Ambulatory Visit: Payer: Self-pay

## 2018-08-19 ENCOUNTER — Ambulatory Visit (HOSPITAL_BASED_OUTPATIENT_CLINIC_OR_DEPARTMENT_OTHER): Payer: Medicare HMO | Admitting: Anesthesiology

## 2018-08-19 ENCOUNTER — Encounter (HOSPITAL_BASED_OUTPATIENT_CLINIC_OR_DEPARTMENT_OTHER): Payer: Self-pay

## 2018-08-19 ENCOUNTER — Encounter (HOSPITAL_BASED_OUTPATIENT_CLINIC_OR_DEPARTMENT_OTHER)
Admission: RE | Disposition: A | Discharge status: Home / Self Care | Payer: Self-pay | Source: Home / Self Care | Attending: Otolaryngology

## 2018-08-19 ENCOUNTER — Ambulatory Visit (HOSPITAL_BASED_OUTPATIENT_CLINIC_OR_DEPARTMENT_OTHER)
Admission: RE | Admit: 2018-08-19 | Discharge: 2018-08-19 | Disposition: A | Discharge status: Home / Self Care | Payer: Medicare HMO | Attending: Otolaryngology | Admitting: Otolaryngology

## 2018-08-19 DIAGNOSIS — T310 Burns involving less than 10% of body surface: Secondary | ICD-10-CM | POA: Insufficient documentation

## 2018-08-19 DIAGNOSIS — M1990 Unspecified osteoarthritis, unspecified site: Secondary | ICD-10-CM | POA: Diagnosis not present

## 2018-08-19 DIAGNOSIS — G709 Myoneural disorder, unspecified: Secondary | ICD-10-CM | POA: Diagnosis not present

## 2018-08-19 DIAGNOSIS — K279 Peptic ulcer, site unspecified, unspecified as acute or chronic, without hemorrhage or perforation: Secondary | ICD-10-CM | POA: Diagnosis not present

## 2018-08-19 DIAGNOSIS — R220 Localized swelling, mass and lump, head: Secondary | ICD-10-CM | POA: Diagnosis not present

## 2018-08-19 DIAGNOSIS — K098 Other cysts of oral region, not elsewhere classified: Secondary | ICD-10-CM | POA: Diagnosis not present

## 2018-08-19 DIAGNOSIS — T2002XA Burn of unspecified degree of lip(s), initial encounter: Secondary | ICD-10-CM | POA: Insufficient documentation

## 2018-08-19 DIAGNOSIS — Z87891 Personal history of nicotine dependence: Secondary | ICD-10-CM | POA: Diagnosis not present

## 2018-08-19 DIAGNOSIS — K099 Cyst of oral region, unspecified: Secondary | ICD-10-CM | POA: Diagnosis not present

## 2018-08-19 DIAGNOSIS — D649 Anemia, unspecified: Secondary | ICD-10-CM | POA: Diagnosis not present

## 2018-08-19 DIAGNOSIS — E039 Hypothyroidism, unspecified: Secondary | ICD-10-CM | POA: Diagnosis not present

## 2018-08-19 DIAGNOSIS — K1379 Other lesions of oral mucosa: Secondary | ICD-10-CM | POA: Diagnosis not present

## 2018-08-19 DIAGNOSIS — K2270 Barrett's esophagus without dysplasia: Secondary | ICD-10-CM | POA: Diagnosis not present

## 2018-08-19 DIAGNOSIS — T799XXA Unspecified early complication of trauma, initial encounter: Secondary | ICD-10-CM | POA: Diagnosis not present

## 2018-08-19 DIAGNOSIS — K219 Gastro-esophageal reflux disease without esophagitis: Secondary | ICD-10-CM | POA: Insufficient documentation

## 2018-08-19 DIAGNOSIS — X58XXXA Exposure to other specified factors, initial encounter: Secondary | ICD-10-CM | POA: Diagnosis not present

## 2018-08-19 HISTORY — DX: Gastro-esophageal reflux disease without esophagitis: K21.9

## 2018-08-19 HISTORY — DX: Hypothyroidism, unspecified: E03.9

## 2018-08-19 HISTORY — PX: EXCISION ORAL TUMOR: SHX6265

## 2018-08-19 SURGERY — EXCISION, NEOPLASM, MOUTH
Anesthesia: General | Site: Mouth | Laterality: Left

## 2018-08-19 MED ORDER — LACTATED RINGERS IV SOLN
INTRAVENOUS | Status: DC
  Administered 2018-08-19 (×3): via INTRAVENOUS

## 2018-08-19 MED ORDER — SUCCINYLCHOLINE CHLORIDE 20 MG/ML IJ SOLN
INTRAMUSCULAR | Status: DC | PRN
  Administered 2018-08-19: 100 mg via INTRAVENOUS

## 2018-08-19 MED ORDER — ONDANSETRON HCL 4 MG/2ML IJ SOLN: 4.0000 mg | Freq: Once | INTRAMUSCULAR | Status: DC | PRN

## 2018-08-19 MED ORDER — OXYCODONE HCL 5 MG PO TABS
ORAL_TABLET | ORAL | Status: AC
  Filled 2018-08-19: qty 1

## 2018-08-19 MED ORDER — AMOXICILLIN 875 MG PO TABS: 875.0000 mg | ORAL_TABLET | Freq: Two times a day (BID) | ORAL | 0 refills | Status: AC

## 2018-08-19 MED ORDER — SCOPOLAMINE 1 MG/3DAYS TD PT72: 1.0000 | MEDICATED_PATCH | Freq: Once | TRANSDERMAL | Status: DC | PRN

## 2018-08-19 MED ORDER — MEPERIDINE HCL 25 MG/ML IJ SOLN: 6.2500 mg | INTRAMUSCULAR | Status: DC | PRN

## 2018-08-19 MED ORDER — ACETAMINOPHEN 500 MG PO TABS: 1000.0000 mg | ORAL_TABLET | Freq: Once | ORAL | Status: DC

## 2018-08-19 MED ORDER — OXYCODONE HCL 5 MG PO TABS
5.0000 mg | ORAL_TABLET | Freq: Once | ORAL | Status: AC | PRN
  Administered 2018-08-19: 16:00:00 5 mg via ORAL

## 2018-08-19 MED ORDER — ONDANSETRON HCL 4 MG/2ML IJ SOLN
INTRAMUSCULAR | Status: DC | PRN
  Administered 2018-08-19: 4 mg via INTRAVENOUS

## 2018-08-19 MED ORDER — PROPOFOL 10 MG/ML IV BOLUS
INTRAVENOUS | Status: DC | PRN
  Administered 2018-08-19: 150 mg via INTRAVENOUS

## 2018-08-19 MED ORDER — LIDOCAINE-EPINEPHRINE 1 %-1:100000 IJ SOLN
INTRAMUSCULAR | Status: DC | PRN
  Administered 2018-08-19: 2 mL

## 2018-08-19 MED ORDER — FENTANYL CITRATE (PF) 100 MCG/2ML IJ SOLN
50.0000 ug | INTRAMUSCULAR | Status: DC | PRN
  Administered 2018-08-19: 100 ug via INTRAVENOUS
  Administered 2018-08-19: 50 ug via INTRAVENOUS

## 2018-08-19 MED ORDER — FENTANYL CITRATE (PF) 100 MCG/2ML IJ SOLN
25.0000 ug | INTRAMUSCULAR | Status: DC | PRN
  Administered 2018-08-19: 16:00:00 25 ug via INTRAVENOUS

## 2018-08-19 MED ORDER — ACETAMINOPHEN 500 MG PO TABS
ORAL_TABLET | ORAL | Status: AC
  Filled 2018-08-19: qty 2

## 2018-08-19 MED ORDER — BACITRACIN 500 UNIT/GM EX OINT
TOPICAL_OINTMENT | CUTANEOUS | Status: DC | PRN
  Administered 2018-08-19: 1 via TOPICAL

## 2018-08-19 MED ORDER — DEXAMETHASONE SODIUM PHOSPHATE 4 MG/ML IJ SOLN
INTRAMUSCULAR | Status: DC | PRN
  Administered 2018-08-19: 4 mg via INTRAVENOUS

## 2018-08-19 MED ORDER — BUPIVACAINE HCL (PF) 0.25 % IJ SOLN
INTRAMUSCULAR | Status: AC
  Filled 2018-08-19: qty 30

## 2018-08-19 MED ORDER — FENTANYL CITRATE (PF) 100 MCG/2ML IJ SOLN
INTRAMUSCULAR | Status: AC
  Filled 2018-08-19: qty 2

## 2018-08-19 MED ORDER — OXYCODONE-ACETAMINOPHEN 5-325 MG PO TABS: 1.0000 | ORAL_TABLET | ORAL | 0 refills | Status: AC | PRN

## 2018-08-19 MED ORDER — BUPIVACAINE HCL (PF) 0.25 % IJ SOLN
INTRAMUSCULAR | Status: DC | PRN
  Administered 2018-08-19: 1 mL

## 2018-08-19 MED ORDER — OXYCODONE HCL 5 MG/5ML PO SOLN: 5.0000 mg | Freq: Once | ORAL | Status: AC | PRN

## 2018-08-19 MED ORDER — BACITRACIN ZINC 500 UNIT/GM EX OINT
TOPICAL_OINTMENT | CUTANEOUS | Status: AC
  Filled 2018-08-19: qty 0.9

## 2018-08-19 MED ORDER — CEFAZOLIN SODIUM-DEXTROSE 2-3 GM-%(50ML) IV SOLR
INTRAVENOUS | Status: DC | PRN
  Administered 2018-08-19: 2 g via INTRAVENOUS

## 2018-08-19 MED ORDER — CEFAZOLIN SODIUM 1 G IJ SOLR
INTRAMUSCULAR | Status: AC
  Filled 2018-08-19: qty 20

## 2018-08-19 MED ORDER — MIDAZOLAM HCL 2 MG/2ML IJ SOLN: 1.0000 mg | INTRAMUSCULAR | Status: DC | PRN

## 2018-08-19 SURGICAL SUPPLY — 47 items
BLADE SURG 15 STRL LF DISP TIS (BLADE) IMPLANT
BLADE SURG 15 STRL SS (BLADE) ×3
CANISTER SUCT 1200ML W/VALVE (MISCELLANEOUS) ×2 IMPLANT
CORD BIPOLAR FORCEPS 12FT (ELECTRODE) ×2 IMPLANT
COVER BACK TABLE REUSABLE LG (DRAPES) ×2 IMPLANT
COVER MAYO STAND REUSABLE (DRAPES) ×3 IMPLANT
COVER WAND RF STERILE (DRAPES) IMPLANT
ELECT COATED BLADE 2.86 ST (ELECTRODE) ×2 IMPLANT
ELECT NDL BLADE 2-5/6 (NEEDLE) IMPLANT
ELECT NEEDLE BLADE 2-5/6 (NEEDLE) ×3 IMPLANT
ELECT REM PT RETURN 9FT ADLT (ELECTROSURGICAL) ×3
ELECT REM PT RETURN 9FT PED (ELECTROSURGICAL)
ELECTRODE REM PT RETRN 9FT PED (ELECTROSURGICAL) IMPLANT
ELECTRODE REM PT RTRN 9FT ADLT (ELECTROSURGICAL) IMPLANT
GAUZE SPONGE 4X4 16PLY XRAY LF (GAUZE/BANDAGES/DRESSINGS) ×2 IMPLANT
GLOVE BIO SURGEON STRL SZ7 (GLOVE) ×2 IMPLANT
GLOVE BIO SURGEON STRL SZ7.5 (GLOVE) ×3 IMPLANT
GLOVE BIOGEL PI IND STRL 7.5 (GLOVE) IMPLANT
GLOVE BIOGEL PI INDICATOR 7.5 (GLOVE) ×2
GOWN STRL REUS W/ TWL LRG LVL3 (GOWN DISPOSABLE) IMPLANT
GOWN STRL REUS W/ TWL XL LVL3 (GOWN DISPOSABLE) IMPLANT
GOWN STRL REUS W/TWL LRG LVL3 (GOWN DISPOSABLE) ×6
GOWN STRL REUS W/TWL XL LVL3 (GOWN DISPOSABLE) ×3
HEMOSTAT SNOW SURGICEL 2X4 (HEMOSTASIS) ×2 IMPLANT
NDL PRECISIONGLIDE 27X1.5 (NEEDLE) IMPLANT
NEEDLE PRECISIONGLIDE 27X1.5 (NEEDLE) ×3 IMPLANT
PACK BASIN DAY SURGERY FS (CUSTOM PROCEDURE TRAY) ×3 IMPLANT
PENCIL BUTTON HOLSTER BLD 10FT (ELECTRODE) ×3 IMPLANT
PENCIL FOOT CONTROL (ELECTRODE) IMPLANT
SHEET MEDIUM DRAPE 40X70 STRL (DRAPES) ×2 IMPLANT
SLEEVE SCD COMPRESS KNEE MED (MISCELLANEOUS) ×2 IMPLANT
SPONGE NEURO XRAY DETECT 1X3 (DISPOSABLE) ×1 IMPLANT
SPONGE TONSIL TAPE 1.25 RFD (DISPOSABLE) IMPLANT
SUCTION FRAZIER HANDLE 10FR (MISCELLANEOUS) ×2
SUCTION TUBE FRAZIER 10FR DISP (MISCELLANEOUS) IMPLANT
SUT CHROMIC 5 0 P 3 (SUTURE) IMPLANT
SUT SILK 3 0 TIES 17X18 (SUTURE) ×3
SUT SILK 3-0 18XBRD TIE BLK (SUTURE) IMPLANT
SUT VIC AB 4-0 SH 27 (SUTURE) ×3
SUT VIC AB 4-0 SH 27XANBCTRL (SUTURE) IMPLANT
SYR BULB 3OZ (MISCELLANEOUS) ×2 IMPLANT
SYR CONTROL 10ML LL (SYRINGE) ×2 IMPLANT
TOWEL GREEN STERILE FF (TOWEL DISPOSABLE) ×3 IMPLANT
TRAY DSU PREP LF (CUSTOM PROCEDURE TRAY) ×2 IMPLANT
TUBE CONNECTING 20'X1/4 (TUBING) ×1
TUBE CONNECTING 20X1/4 (TUBING) ×1 IMPLANT
YANKAUER SUCT BULB TIP NO VENT (SUCTIONS) IMPLANT

## 2018-08-19 NOTE — Discharge Instructions (Addendum)
The patient may resume all his previous activities and diet.  He will follow-up in my [place] office in 1 week.   NO TYLENOL BEFORE 12 midnight tonight!   Post Anesthesia Home Care Instructions  Activity: Get plenty of rest for the remainder of the day. A responsible individual must stay with you for 24 hours following the procedure.  For the next 24 hours, DO NOT: -Drive a car -Paediatric nurse -Drink alcoholic beverages -Take any medication unless instructed by your physician -Make any legal decisions or sign important papers.  Meals: Start with liquid foods such as gelatin or soup. Progress to regular foods as tolerated. Avoid greasy, spicy, heavy foods. If nausea and/or vomiting occur, drink only clear liquids until the nausea and/or vomiting subsides. Call your physician if vomiting continues.  Special Instructions/Symptoms: Your throat may feel dry or sore from the anesthesia or the breathing tube placed in your throat during surgery. If this causes discomfort, gargle with warm salt water. The discomfort should disappear within 24 hours.  If you had a scopolamine patch placed behind your ear for the management of post- operative nausea and/or vomiting:  1. The medication in the patch is effective for 72 hours, after which it should be removed.  Wrap patch in a tissue and discard in the trash. Wash hands thoroughly with soap and water. 2. You may remove the patch earlier than 72 hours if you experience unpleasant side effects which may include dry mouth, dizziness or visual disturbances. 3. Avoid touching the patch. Wash your hands with soap and water after contact with the patch.

## 2018-08-19 NOTE — Op Note (Signed)
DATE OF PROCEDURE:  08/19/2018                              OPERATIVE REPORT  SURGEON:  Leta Baptist, MD  PREOPERATIVE DIAGNOSES: 1. Left cheek mass  POSTOPERATIVE DIAGNOSES: 1. Left cheek mass  PROCEDURE PERFORMED:  Complex excision of left buccal submucosa mass, with underlying masseter muscle.  ANESTHESIA:  General endotracheal tube anesthesia.  COMPLICATIONS:  Burn injury on the left lower lip retractor site.  ESTIMATED BLOOD LOSS:  Minimal.  INDICATION FOR PROCEDURE:  Sean Leonard is a 73 y.o. male with a history of an enlarging left facial mass.  His CT scan showed a soft tissue mass along the left masseter muscle.  Internal necrosis was noted.  He underwent a fine-needle aspiration biopsy of the mass.  The cytology examination showed no malignant cells. Based on the above findings, the decision was made for the patient to undergo the excision procedure.  The risks, benefits, alternatives, and details of the procedure were discussed with the patient.  Questions were invited and answered.  Informed consent was obtained.  DESCRIPTION:  The patient was taken to the operating room and placed supine on the operating table.  General endotracheal tube anesthesia was administered by the anesthesiologist.  The patient was positioned and prepped and draped in a standard fashion for oral surgery.  A Crowe-Davis mouth gag was inserted into the oral cavity for exposure.   Palpation of the left cheek revealed a 3 cm soft tissue mass.  1% with 1-100,000 epinephrine was infiltrated at the planned site of incision.  A buccal mucosa incision was made overlying the mass.  The incision was carried down to the submucosal level.  A 3 cm soft tissue mass was noted to be tightly adhering to the masseter muscle.  The soft tissue mass was carefully dissected free, together with portion of the attached masseter muscle.  Hemostasis was achieved using a combination of suture ligature, bipolar, and monopolar  electrocautery devices.  The surgical site was copiously irrigated.  The incision was closed in layers with 4-0 Vicryl sutures.  The mouth gag was removed.  At this point, a portion of the right lower lip was noted to have a burn mark, at the site of the [facility].  Marcaine injection and antibiotic ointment were applied.  The care of the patient was turned over to the anesthesiologist.  The patient was awakened from anesthesia without difficulty.  The patient was extubated and transferred to the recovery room in good condition.  OPERATIVE FINDINGS: A 3 cm left cheek mass.  SPECIMEN: Left cheek mass.  FOLLOWUP CARE:  The patient will be discharged home once awake and alert.  He will be placed on amoxicillin 875 mg p.o. b.i.d. for 5 days, and Percocet when necessary for breakthrough pain.  The patient will follow up in my office in approximately 1 week.  Keilly Fatula W Thai Hemrick 08/19/2018 3:02 PM

## 2018-08-19 NOTE — Anesthesia Preprocedure Evaluation (Signed)
Anesthesia Evaluation  Patient identified by MRN, date of birth, ID band Patient awake    Reviewed: Allergy & Precautions, NPO status , Patient's Chart, lab work & pertinent test results  Airway Mallampati: II  TM Distance: >3 FB Neck ROM: Full    Dental  (+) Upper Dentures, Lower Dentures   Pulmonary former smoker,    Pulmonary exam normal breath sounds clear to auscultation       Cardiovascular negative cardio ROS Normal cardiovascular exam Rhythm:Regular Rate:Normal     Neuro/Psych  Neuromuscular disease negative psych ROS   GI/Hepatic PUD, GERD  Medicated and Controlled,  Endo/Other  Hypothyroidism Hyperthyroidism   Renal/GU   negative genitourinary   Musculoskeletal  (+) Arthritis , Osteoarthritis,    Abdominal   Peds  Hematology  (+) anemia ,   Anesthesia Other Findings   Reproductive/Obstetrics                             Anesthesia Physical Anesthesia Plan  ASA: II  Anesthesia Plan: General   Post-op Pain Management:    Induction: Intravenous  PONV Risk Score and Plan: 2 and Ondansetron, Treatment may vary due to age or medical condition and Dexamethasone  Airway Management Planned: Oral ETT  Additional Equipment:   Intra-op Plan:   Post-operative Plan: Extubation in OR  Informed Consent: I have reviewed the patients History and Physical, chart, labs and discussed the procedure including the risks, benefits and alternatives for the proposed anesthesia with the patient or authorized representative who has indicated his/her understanding and acceptance.     Dental advisory given  Plan Discussed with: CRNA and Surgeon  Anesthesia Plan Comments:         Anesthesia Quick Evaluation

## 2018-08-19 NOTE — H&P (Signed)
Cc: Left cheek mass  HPI: The patient is a 73 year old male who returns today for his follow-up evaluation. The patient was last seen 3 weeks ago for left facial mass.  His CT scan showed a soft tissue mass along the left masseter muscle.  Internal necrosis was noted.  The differential diagnosis included squamous cell carcinoma or minor salivary gland tumor.  He underwent a fine needle aspiration biopsy of the mass.   3 cc of brownish fluid was aspirated from the mass.  The cytology examination showed no malignant cells.  According to the patient, he continues to have the left facial mass.  The size is stable.  He denies any dysphagia or odynophagia.  He also denies any facial weakness.   Exam General: Communicates without difficulty, well nourished, no acute distress. Head: Normocephalic, no evidence injury, no tenderness, facial buttresses intact without stepoff. Face/sinus: No tenderness to palpation and percussion. Facial movement is normal and symmetric. The patient has a 3 cm left cheek mass.  No surface ulceration is noted on the left buccal mucosa.  No visible abnormality is noted on the skin of the left cheek.  Eyes: PERRL, EOMI. No scleral icterus, conjunctivae clear. Neuro: CN II exam reveals vision grossly intact.  No nystagmus at any point of gaze. Ears: Auricles well formed without lesions.  Ear canals are intact without mass or lesion.  No erythema or edema is appreciated.  The TMs are intact without fluid. Nose: External evaluation reveals normal support and skin without lesions.  Dorsum is intact.  Anterior rhinoscopy reveals pink mucosa over anterior aspect of inferior turbinates and intact septum.  No purulence noted. Oral:  Oral cavity and oropharynx are intact, symmetric, without erythema or edema.  Mucosa is moist without lesions. Neck: Full range of motion without pain.  There is no significant lymphadenopathy.  No masses palpable.  Thyroid bed within normal limits to palpation.  Parotid  glands and submandibular glands equal bilaterally without mass.  Trachea is midline.   Assessment  1.  The patient has a 3 cm left cheek mass.  Internal necrosis was noted on the previous CT scan.  His FNA was negative.  The differential includes squamous cell carcinoma versus minor salivary gland tumor.   Plan   1.  The physical exam findings and the FNA results are reviewed with the patient.  2.  The treatment options are discussed.  The options include continuing conservative observation versus surgical excision of the mass,  The surgery will likely involve a transoral approach to removing the mass.  The risks include injury to the facial nerve or the parotid duct.  The risks, benefits, alternatives and details of the treatment options are extensively reviewed.   3.  The patient would like to proceed with the procedure.

## 2018-08-19 NOTE — Transfer of Care (Signed)
Immediate Anesthesia Transfer of Care Note  Patient: Sean Leonard  Procedure(s) Performed: EXCISION LEFT BUCCAL MASS (Left Mouth)  Patient Location: PACU  Anesthesia Type:General  Level of Consciousness: sedated and confused  Airway & Oxygen Therapy: Patient Spontanous Breathing and Patient connected to face mask oxygen  Post-op Assessment: Report given to RN and Post -op Vital signs reviewed and stable  Post vital signs: Reviewed and stable  Last Vitals:  Vitals Value Taken Time  BP 129/95 08/19/2018  3:01 PM  Temp    Pulse 101 08/19/2018  3:01 PM  Resp 26 08/19/2018  3:06 PM  SpO2 98 % 08/19/2018  3:01 PM  Vitals shown include unvalidated device data.  Last Pain:  Vitals:   08/19/18 1027  TempSrc: Oral  PainSc: 0-No pain         Complications: No apparent anesthesia complications

## 2018-08-19 NOTE — Anesthesia Procedure Notes (Signed)
Procedure Name: Intubation Date/Time: 08/19/2018 1:08 PM Performed by: Signe Colt, CRNA Pre-anesthesia Checklist: Patient identified, Emergency Drugs available, Suction available and Patient being monitored Patient Re-evaluated:Patient Re-evaluated prior to induction Oxygen Delivery Method: Circle system utilized Preoxygenation: Pre-oxygenation with 100% oxygen Induction Type: IV induction Ventilation: Mask ventilation without difficulty Laryngoscope Size: Mac and 4 Tube type: Oral Tube size: 7.0 mm Number of attempts: 1 Airway Equipment and Method: Stylet and Oral airway Placement Confirmation: ETT inserted through vocal cords under direct vision,  positive ETCO2 and breath sounds checked- equal and bilateral Tube secured with: Tape Dental Injury: Teeth and Oropharynx as per pre-operative assessment

## 2018-08-20 ENCOUNTER — Encounter (HOSPITAL_BASED_OUTPATIENT_CLINIC_OR_DEPARTMENT_OTHER): Payer: Self-pay | Admitting: Otolaryngology

## 2018-08-20 DIAGNOSIS — Z6823 Body mass index (BMI) 23.0-23.9, adult: Secondary | ICD-10-CM | POA: Diagnosis not present

## 2018-08-20 DIAGNOSIS — R030 Elevated blood-pressure reading, without diagnosis of hypertension: Secondary | ICD-10-CM | POA: Diagnosis not present

## 2018-08-20 NOTE — Anesthesia Postprocedure Evaluation (Signed)
Anesthesia Post Note  Patient: Sean Leonard  Procedure(s) Performed: EXCISION LEFT BUCCAL MASS (Left Mouth)     Patient location during evaluation: PACU Anesthesia Type: General Level of consciousness: awake and alert Pain management: pain level controlled Vital Signs Assessment: post-procedure vital signs reviewed and stable Respiratory status: spontaneous breathing, nonlabored ventilation and respiratory function stable Cardiovascular status: blood pressure returned to baseline and stable Postop Assessment: no apparent nausea or vomiting Anesthetic complications: no    Last Vitals:  Vitals:   08/19/18 1615 08/19/18 1645  BP: (!) 156/96 (!) 145/99  Pulse: 92 96  Resp: 14 20  Temp:  36.7 C  SpO2: 96% 98%    Last Pain:  Vitals:   08/19/18 1645  TempSrc:   PainSc: 0-No pain                 Yardley Beltran,W. EDMOND

## 2018-08-27 ENCOUNTER — Other Ambulatory Visit: Payer: Self-pay

## 2018-08-27 ENCOUNTER — Ambulatory Visit (INDEPENDENT_AMBULATORY_CARE_PROVIDER_SITE_OTHER): Payer: Medicare HMO | Admitting: Otolaryngology

## 2018-09-18 ENCOUNTER — Inpatient Hospital Stay: Admission: RE | Admit: 2018-09-18 | Payer: Medicare HMO | Source: Ambulatory Visit

## 2018-09-29 ENCOUNTER — Ambulatory Visit: Payer: Medicare HMO | Admitting: Vascular Surgery

## 2018-09-30 ENCOUNTER — Other Ambulatory Visit: Payer: Self-pay

## 2018-09-30 ENCOUNTER — Other Ambulatory Visit: Payer: Medicare HMO

## 2018-09-30 DIAGNOSIS — Z20822 Contact with and (suspected) exposure to covid-19: Secondary | ICD-10-CM

## 2018-09-30 DIAGNOSIS — R6889 Other general symptoms and signs: Secondary | ICD-10-CM | POA: Diagnosis not present

## 2018-10-02 ENCOUNTER — Ambulatory Visit
Admission: RE | Admit: 2018-10-02 | Discharge: 2018-10-02 | Disposition: A | Discharge status: Home / Self Care | Payer: Medicare HMO | Source: Ambulatory Visit | Attending: Vascular Surgery | Admitting: Vascular Surgery

## 2018-10-02 DIAGNOSIS — I723 Aneurysm of iliac artery: Secondary | ICD-10-CM | POA: Diagnosis not present

## 2018-10-02 DIAGNOSIS — N281 Cyst of kidney, acquired: Secondary | ICD-10-CM | POA: Diagnosis not present

## 2018-10-02 MED ORDER — IOPAMIDOL (ISOVUE-370) INJECTION 76%
75.0000 mL | Freq: Once | INTRAVENOUS | Status: AC | PRN
  Administered 2018-10-02: 75 mL via INTRAVENOUS

## 2018-10-05 LAB — NOVEL CORONAVIRUS, NAA: SARS-CoV-2, NAA: NOT DETECTED

## 2018-10-12 ENCOUNTER — Telehealth: Payer: Self-pay | Admitting: General Practice

## 2018-10-12 NOTE — Telephone Encounter (Signed)
Patient informed of negative covid-19 result. Patient verbalized understanding.  °

## 2018-10-19 ENCOUNTER — Telehealth: Payer: Self-pay | Admitting: *Deleted

## 2018-10-20 ENCOUNTER — Ambulatory Visit (INDEPENDENT_AMBULATORY_CARE_PROVIDER_SITE_OTHER): Payer: Medicare HMO | Admitting: Vascular Surgery

## 2018-10-20 ENCOUNTER — Encounter: Payer: Self-pay | Admitting: Vascular Surgery

## 2018-10-20 ENCOUNTER — Other Ambulatory Visit: Payer: Self-pay

## 2018-10-20 VITALS — Wt 182.0 lb

## 2018-10-20 DIAGNOSIS — I723 Aneurysm of iliac artery: Secondary | ICD-10-CM | POA: Diagnosis not present

## 2018-10-20 NOTE — Progress Notes (Signed)
Virtual Visit via Telephone Note    I connected with Sean Leonard on 10/20/2018 using the [employer] by telephone and verified that I was speaking with the correct person using two identifiers. Patient was located at home and accompanied by no one. I am located at [facility].   The limitations of evaluation and management by telemedicine and the availability of in person appointments have been previously discussed with the patient and are documented in the patients chart. The patient expressed understanding and consented to proceed.  PCP: Lavella Lemons, PA   Chief Complaint: Aneurysm follow-up  History of Present Illness: Sean Leonard is a 73 y.o. male with here for aneurysm follow-up.  Is being talked to by telephone due to [place].  Has known iliac artery aneurysms and has a 2-year CT follow-up from 10/02/2018.  I reviewed his actually films and discussed this at length with the patient by telephon  Past Medical History:  Diagnosis Date  . DDD (degenerative disc disease), lumbar   . Diverticula of colon   . GERD (gastroesophageal reflux disease)   . Hyperthyroidism   . Hypothyroidism   . Iliac artery aneurysm, bilateral (HCC)    with significant stenosis of right internal iliac artery  . Peptic ulcer   . Prostatitis   . Thyroid disease     Past Surgical History:  Procedure Laterality Date  . BIOPSY  01/02/2017   Procedure: BIOPSY;  Surgeon: Daneil Dolin, MD;  [facility];  Service: Endoscopy;;  GASTRIC AND ESOPHAGEAL BIOPSIES  . COLONOSCOPY  April 2005   Dr. Irving Shows. Small internal hemorrhoids. Multiple small scattered diverticula in the cecum and descending colon.  . COLONOSCOPY N/A 01/18/2014   Procedure: COLONOSCOPY;  Surgeon: Daneil Dolin, MD;  [facility];  Service: Endoscopy;  Laterality: N/A;  1100  . COLONOSCOPY N/A 06/23/2016   Procedure: COLONOSCOPY;  Surgeon: Rogene Houston, MD;  [facility];  Service:  Endoscopy;  Laterality: N/A;  . COLONOSCOPY WITH ESOPHAGOGASTRODUODENOSCOPY (EGD) N/A 12/16/2012   Dr. Gala Romney: rectal and colonic polyps with inadequate preparation, tubular adenoma. EGD with biopsy proven short segment Barrett's esophagus, hiatal hernia, mild chronic inactive gastritis   . COLONOSCOPY WITH PROPOFOL N/A 06/24/2016   Procedure: COLONOSCOPY WITH PROPOFOL;  Surgeon: Danie Binder, MD;  [facility];  Service: Endoscopy;  Laterality: N/A;  . COLONOSCOPY WITH PROPOFOL N/A 06/28/2016   Procedure: COLONOSCOPY WITH PROPOFOL;  Surgeon: Mauri Pole, MD;  Location: [facility];  Service: Endoscopy;  Laterality: N/A;  . ESOPHAGOGASTRODUODENOSCOPY  July 2005   Dr. Irving Shows grade 2 esophagitis at the GE junction. Acute gastritis. Multiple acute active, deep, irregular, friable ulcers in the antrum and body of the stomach. 2 active, deep, friable, irregular ulcers in the duodenum. Pathology not available.  . ESOPHAGOGASTRODUODENOSCOPY N/A 01/18/2014   Procedure: ESOPHAGOGASTRODUODENOSCOPY (EGD);  Surgeon: Daneil Dolin, MD;  [facility];  Service: Endoscopy;  Laterality: N/A;  . ESOPHAGOGASTRODUODENOSCOPY (EGD) WITH PROPOFOL N/A 01/02/2017   Procedure: ESOPHAGOGASTRODUODENOSCOPY (EGD) WITH PROPOFOL;  Surgeon: Daneil Dolin, MD;  [facility];  Service: Endoscopy;  Laterality: N/A;  9:15am  . EXCISION ORAL TUMOR Left 08/19/2018   Procedure: EXCISION LEFT BUCCAL MASS;  Surgeon: Leta Baptist, MD;  Location: [place];  Service: ENT;  Laterality: Left;  . IR ANGIOGRAM VISCERAL SELECTIVE  06/26/2016  . IR ANGIOGRAM VISCERAL SELECTIVE  06/26/2016  . IR ANGIOGRAM  VISCERAL SELECTIVE  06/26/2016  . IR US GUIDE VASC ACCESS RIGHT  06/26/2016  . NOSE SURGERY    . POLYPECTOMY  06/24/2016   Procedure: POLYPECTOMY;  Surgeon: Danie Binder, MD;  [facility];  Service: Endoscopy;;  cecal  . THYROIDECTOMY    . TONSILLECTOMY      Current Meds   Medication Sig  . Cholecalciferol (VITAMIN D3) 125 MCG (5000 UT) CAPS Take by mouth.  . ferrous sulfate 325 (65 FE) MG tablet Take 1 tablet (325 mg total) by mouth 2 (two) times daily with a meal.  . gabapentin (NEURONTIN) 300 MG capsule   . levothyroxine (SYNTHROID, LEVOTHROID) 112 MCG tablet Take 112 mcg by mouth daily before breakfast.   . mirtazapine (REMERON) 15 MG tablet   . Multiple Vitamin (MULTIVITAMIN WITH MINERALS) TABS tablet Take 1 tablet by mouth daily.  . Omega-3 Fatty Acids (FISH OIL) 1200 MG CAPS Take 1,200 mg by mouth 2 (two) times daily.  Marland Kitchen omeprazole (PRILOSEC) 20 MG capsule TAKE 1 CAPSULE EVERY DAY  . pravastatin (PRAVACHOL) 20 MG tablet Take 20 mg by mouth at bedtime.  . sildenafil (VIAGRA) 100 MG tablet TAKE 1 TABLET BY MOUTH AS DIRECTED 30 MINUTES PRIOR TO INTERCOURSE    12 system ROS was negative unless otherwise noted in HPI   Observations/Objective: CT scan from 10/02/2018 reviewed.  This shows no significant change.  He does have bilateral common iliac artery aneurysms largest being the right at 2.5 cm and left common iliac artery at 2.0 cm he does have internal iliac arteries as well with the aneurysm on the right of 2.3 in the left of 2.1  Assessment and Plan:  No change in bilateral common and internal iliac artery aneurysms by CT scan with a 2-year interval Follow Up Instructions:   Will see him again in 2 years for repeat CT scan follow-up   I discussed the assessment and treatment plan with the patient. The patient was provided an opportunity to ask questions and all were answered. The patient agreed with the plan and demonstrated an understanding of the instructions.   The patient was advised to call back or seek an in-person evaluation if the symptoms worsen or if the condition fails to improve as anticipated.  I spent 10 minutes with the patient via telephone encounter.   Annamary Rummage [facility] [place] [place] Office:  [phone]  10/20/2018, 9:24 AM

## 2018-10-23 DIAGNOSIS — I10 Essential (primary) hypertension: Secondary | ICD-10-CM | POA: Diagnosis not present

## 2018-10-23 DIAGNOSIS — E782 Mixed hyperlipidemia: Secondary | ICD-10-CM | POA: Diagnosis not present

## 2018-10-29 DIAGNOSIS — Z6823 Body mass index (BMI) 23.0-23.9, adult: Secondary | ICD-10-CM | POA: Diagnosis not present

## 2018-10-29 DIAGNOSIS — K219 Gastro-esophageal reflux disease without esophagitis: Secondary | ICD-10-CM | POA: Diagnosis not present

## 2018-10-29 DIAGNOSIS — E782 Mixed hyperlipidemia: Secondary | ICD-10-CM | POA: Diagnosis not present

## 2018-10-29 DIAGNOSIS — E039 Hypothyroidism, unspecified: Secondary | ICD-10-CM | POA: Diagnosis not present

## 2018-10-29 DIAGNOSIS — Z8719 Personal history of other diseases of the digestive system: Secondary | ICD-10-CM | POA: Diagnosis not present

## 2018-10-29 DIAGNOSIS — Z0000 Encounter for general adult medical examination without abnormal findings: Secondary | ICD-10-CM | POA: Diagnosis not present

## 2018-11-23 DIAGNOSIS — E039 Hypothyroidism, unspecified: Secondary | ICD-10-CM | POA: Diagnosis not present

## 2018-11-23 DIAGNOSIS — I10 Essential (primary) hypertension: Secondary | ICD-10-CM | POA: Diagnosis not present

## 2018-11-23 DIAGNOSIS — E782 Mixed hyperlipidemia: Secondary | ICD-10-CM | POA: Diagnosis not present

## 2018-12-01 DIAGNOSIS — Z23 Encounter for immunization: Secondary | ICD-10-CM | POA: Diagnosis not present

## 2019-01-22 ENCOUNTER — Other Ambulatory Visit: Payer: Self-pay

## 2019-01-22 DIAGNOSIS — E782 Mixed hyperlipidemia: Secondary | ICD-10-CM | POA: Diagnosis not present

## 2019-01-22 DIAGNOSIS — I10 Essential (primary) hypertension: Secondary | ICD-10-CM | POA: Diagnosis not present

## 2019-01-22 DIAGNOSIS — Z20822 Contact with and (suspected) exposure to covid-19: Secondary | ICD-10-CM

## 2019-01-22 DIAGNOSIS — Z20828 Contact with and (suspected) exposure to other viral communicable diseases: Secondary | ICD-10-CM | POA: Diagnosis not present

## 2019-01-24 ENCOUNTER — Telehealth: Payer: Self-pay

## 2019-01-24 LAB — NOVEL CORONAVIRUS, NAA: SARS-CoV-2, NAA: NOT DETECTED

## 2019-01-24 NOTE — Telephone Encounter (Signed)
Patient called in requesting [place] lab results  - DOB/Address verified - Negative results given, no further questions.

## 2019-02-22 DIAGNOSIS — E782 Mixed hyperlipidemia: Secondary | ICD-10-CM | POA: Diagnosis not present

## 2019-02-22 DIAGNOSIS — I10 Essential (primary) hypertension: Secondary | ICD-10-CM | POA: Diagnosis not present

## 2019-03-25 DIAGNOSIS — I10 Essential (primary) hypertension: Secondary | ICD-10-CM | POA: Diagnosis not present

## 2019-03-25 DIAGNOSIS — E782 Mixed hyperlipidemia: Secondary | ICD-10-CM | POA: Diagnosis not present

## 2019-04-02 DIAGNOSIS — I10 Essential (primary) hypertension: Secondary | ICD-10-CM | POA: Diagnosis not present

## 2019-04-02 DIAGNOSIS — K219 Gastro-esophageal reflux disease without esophagitis: Secondary | ICD-10-CM | POA: Diagnosis not present

## 2019-04-02 DIAGNOSIS — E782 Mixed hyperlipidemia: Secondary | ICD-10-CM | POA: Diagnosis not present

## 2019-04-02 DIAGNOSIS — K625 Hemorrhage of anus and rectum: Secondary | ICD-10-CM | POA: Diagnosis not present

## 2019-04-02 DIAGNOSIS — R634 Abnormal weight loss: Secondary | ICD-10-CM | POA: Diagnosis not present

## 2019-04-02 DIAGNOSIS — E039 Hypothyroidism, unspecified: Secondary | ICD-10-CM | POA: Diagnosis not present

## 2019-04-02 DIAGNOSIS — Z6823 Body mass index (BMI) 23.0-23.9, adult: Secondary | ICD-10-CM | POA: Diagnosis not present

## 2019-04-30 DIAGNOSIS — E039 Hypothyroidism, unspecified: Secondary | ICD-10-CM | POA: Diagnosis not present

## 2019-04-30 DIAGNOSIS — K625 Hemorrhage of anus and rectum: Secondary | ICD-10-CM | POA: Diagnosis not present

## 2019-04-30 DIAGNOSIS — K219 Gastro-esophageal reflux disease without esophagitis: Secondary | ICD-10-CM | POA: Diagnosis not present

## 2019-04-30 DIAGNOSIS — I10 Essential (primary) hypertension: Secondary | ICD-10-CM | POA: Diagnosis not present

## 2019-04-30 DIAGNOSIS — E782 Mixed hyperlipidemia: Secondary | ICD-10-CM | POA: Diagnosis not present

## 2019-04-30 DIAGNOSIS — Z6823 Body mass index (BMI) 23.0-23.9, adult: Secondary | ICD-10-CM | POA: Diagnosis not present

## 2019-05-21 DIAGNOSIS — I10 Essential (primary) hypertension: Secondary | ICD-10-CM | POA: Diagnosis not present

## 2019-05-21 DIAGNOSIS — E7849 Other hyperlipidemia: Secondary | ICD-10-CM | POA: Diagnosis not present

## 2019-05-25 ENCOUNTER — Ambulatory Visit: Payer: Medicare HMO | Attending: Internal Medicine

## 2019-05-25 DIAGNOSIS — Z23 Encounter for immunization: Secondary | ICD-10-CM | POA: Insufficient documentation

## 2019-05-25 NOTE — Progress Notes (Signed)
   Covid-19 Vaccination Clinic  Name:  Sean Leonard    MRN: [id number] DOB: 12-05-1945  05/25/2019  Sean Leonard was observed post Covid-19 immunization for 15 minutes without incident. He was provided with Vaccine Information Sheet and instruction to access the V-Safe system.   Sean Leonard was instructed to call 911 with any severe reactions post vaccine: Marland Kitchen Difficulty breathing  . Swelling of face and throat  . A fast heartbeat  . A bad rash all over body  . Dizziness and weakness   Immunizations Administered    Name Date Dose VIS Date Route   Moderna COVID-19 Vaccine 05/25/2019  2:40 AM 0.5 mL 02/23/2019 Intramuscular   Manufacturer: Moderna   Lot: [id number]   [id number]

## 2019-06-10 DIAGNOSIS — D23121 Other benign neoplasm of skin of left upper eyelid, including canthus: Secondary | ICD-10-CM | POA: Diagnosis not present

## 2019-06-10 DIAGNOSIS — H2513 Age-related nuclear cataract, bilateral: Secondary | ICD-10-CM | POA: Diagnosis not present

## 2019-06-10 DIAGNOSIS — H5210 Myopia, unspecified eye: Secondary | ICD-10-CM | POA: Diagnosis not present

## 2019-06-10 DIAGNOSIS — E7800 Pure hypercholesterolemia, unspecified: Secondary | ICD-10-CM | POA: Diagnosis not present

## 2019-06-19 ENCOUNTER — Encounter (HOSPITAL_COMMUNITY): Payer: Self-pay | Admitting: Emergency Medicine

## 2019-06-19 ENCOUNTER — Emergency Department (HOSPITAL_COMMUNITY)
Admission: EM | Admit: 2019-06-19 | Discharge: 2019-06-19 | Disposition: A | Discharge status: Home / Self Care | Payer: Medicare HMO | Attending: Emergency Medicine | Admitting: Emergency Medicine

## 2019-06-19 ENCOUNTER — Other Ambulatory Visit: Payer: Self-pay

## 2019-06-19 DIAGNOSIS — Z79899 Other long term (current) drug therapy: Secondary | ICD-10-CM | POA: Diagnosis not present

## 2019-06-19 DIAGNOSIS — Z87891 Personal history of nicotine dependence: Secondary | ICD-10-CM | POA: Insufficient documentation

## 2019-06-19 DIAGNOSIS — R61 Generalized hyperhidrosis: Secondary | ICD-10-CM | POA: Diagnosis not present

## 2019-06-19 DIAGNOSIS — E039 Hypothyroidism, unspecified: Secondary | ICD-10-CM | POA: Diagnosis not present

## 2019-06-19 DIAGNOSIS — R55 Syncope and collapse: Secondary | ICD-10-CM

## 2019-06-19 LAB — CBC WITH DIFFERENTIAL/PLATELET
Abs Immature Granulocytes: 0.03 10*3/uL (ref 0.00–0.07)
Basophils Absolute: 0 10*3/uL (ref 0.0–0.1)
Basophils Relative: 0 %
Eosinophils Absolute: 0 10*3/uL (ref 0.0–0.5)
Eosinophils Relative: 0 %
HCT: 42 % (ref 39.0–52.0)
Hemoglobin: 13.3 g/dL (ref 13.0–17.0)
Immature Granulocytes: 0 %
Lymphocytes Relative: 17 %
Lymphs Abs: 1.7 10*3/uL (ref 0.7–4.0)
MCH: 30 pg (ref 26.0–34.0)
MCHC: 31.7 g/dL (ref 30.0–36.0)
MCV: 94.6 fL (ref 80.0–100.0)
Monocytes Absolute: 1.2 10*3/uL — ABNORMAL HIGH (ref 0.1–1.0)
Monocytes Relative: 12 %
Neutro Abs: 6.8 10*3/uL (ref 1.7–7.7)
Neutrophils Relative %: 71 %
Platelets: 341 10*3/uL (ref 150–400)
RBC: 4.44 MIL/uL (ref 4.22–5.81)
RDW: 14.2 % (ref 11.5–15.5)
WBC: 9.6 10*3/uL (ref 4.0–10.5)
nRBC: 0 % (ref 0.0–0.2)

## 2019-06-19 LAB — CBG MONITORING, ED: Glucose-Capillary: 112 mg/dL — ABNORMAL HIGH (ref 70–99)

## 2019-06-19 LAB — COMPREHENSIVE METABOLIC PANEL
ALT: 17 U/L (ref 0–44)
AST: 18 U/L (ref 15–41)
Albumin: 3.9 g/dL (ref 3.5–5.0)
Alkaline Phosphatase: 69 U/L (ref 38–126)
Anion gap: 12 (ref 5–15)
BUN: 22 mg/dL (ref 8–23)
CO2: 23 mmol/L (ref 22–32)
Calcium: 9.4 mg/dL (ref 8.9–10.3)
Chloride: 101 mmol/L (ref 98–111)
Creatinine, Ser: 1.22 mg/dL (ref 0.61–1.24)
GFR calc Af Amer: >60 mL/min (ref >60)
GFR calc non Af Amer: 59 mL/min — ABNORMAL LOW (ref >60)
Glucose, Bld: 111 mg/dL — ABNORMAL HIGH (ref 70–99)
Potassium: 4.4 mmol/L (ref 3.5–5.1)
Sodium: 136 mmol/L (ref 135–145)
Total Bilirubin: 0.4 mg/dL (ref 0.3–1.2)
Total Protein: 7.9 g/dL (ref 6.5–8.1)

## 2019-06-19 NOTE — ED Notes (Signed)
Pt report of syncopal episode lasting 10 minutes or more.   He was with a friend and did not fall to floor or hit his head   He is neuro intact  Ambulates   EMS called, pt refused transport   Elected to come POV for eval

## 2019-06-19 NOTE — ED Triage Notes (Signed)
Patient reports having LOC while in conversation with someone at the store. Per patient was sitting at a table and did not hit head. Per patient was told LOC lasted approx 10-12 minutes. Patient assessed by paramedic  And was told he was hypotensive but unsure what his blood pressure was. Patient has EKG strip from paramedic with him. Denies every having a syncopal episode before.

## 2019-06-20 NOTE — ED Provider Notes (Signed)
[facility] Provider Note   CSN: [id number] Arrival date & time: 06/19/19  1551     History Chief Complaint  Patient presents with  . Loss of Consciousness    Sean Leonard is a 74 y.o. male.  HPI    6yM with syncope. Happened just before arrival. Was seated speaking with a friend when he began to feel hot and then began sweating. Subsequently passed out. He is not sure how long but told that several minutes. Currently back to his normal self. Denies preceding pain, nausea, dyspnea. Has been in usual state of health over the last several days. No fever. No n/v/d. No urinary complaints.   Past Medical History:  Diagnosis Date  . DDD (degenerative disc disease), lumbar   . Diverticula of colon   . GERD (gastroesophageal reflux disease)   . Hyperthyroidism   . Hypothyroidism   . Iliac artery aneurysm, bilateral (HCC)    with significant stenosis of right internal iliac artery  . Peptic ulcer   . Prostatitis   . Thyroid disease     Patient Active Problem List   Diagnosis Date Noted  . Carpal tunnel syndrome, left upper limb 12/31/2017  . Anorexia 12/09/2016  . Anemia 12/09/2016  . Constipation 08/22/2016  . Abdominal pain   . Acute lower GI bleeding   . Hematochezia   . Colon ulcer   . Gastrointestinal hemorrhage associated with intestinal diverticulitis 06/22/2016  . Barrett's esophagus 12/29/2013  . Adenomatous colon polyp 12/29/2013  . Iliac artery aneurysm ([place]) 11/26/2012  . Loss of weight 11/19/2012  . History of peptic ulcer disease 11/19/2012  . Abdominal pain, left upper quadrant 11/19/2012  . Abdominal pain, epigastric 11/19/2012  . Elevated PSA, between 10 and less than 20 ng/ml 11/19/2012  . Iliac artery stenosis, right ([place]) 11/19/2012  . Aneurysm of internal iliac artery ([place]) 11/19/2012    Past Surgical History:  Procedure Laterality Date  . BIOPSY  01/02/2017   Procedure: BIOPSY;  Surgeon: Daneil Dolin, MD;   [facility];  Service: Endoscopy;;  GASTRIC AND ESOPHAGEAL BIOPSIES  . COLONOSCOPY  April 2005   Dr. Irving Shows. Small internal hemorrhoids. Multiple small scattered diverticula in the cecum and descending colon.  . COLONOSCOPY N/A 01/18/2014   Procedure: COLONOSCOPY;  Surgeon: Daneil Dolin, MD;  [facility];  Service: Endoscopy;  Laterality: N/A;  1100  . COLONOSCOPY N/A 06/23/2016   Procedure: COLONOSCOPY;  Surgeon: Rogene Houston, MD;  [facility];  Service: Endoscopy;  Laterality: N/A;  . COLONOSCOPY WITH ESOPHAGOGASTRODUODENOSCOPY (EGD) N/A 12/16/2012   Dr. Gala Romney: rectal and colonic polyps with inadequate preparation, tubular adenoma. EGD with biopsy proven short segment Barrett's esophagus, hiatal hernia, mild chronic inactive gastritis   . COLONOSCOPY WITH PROPOFOL N/A 06/24/2016   Procedure: COLONOSCOPY WITH PROPOFOL;  Surgeon: Danie Binder, MD;  [facility];  Service: Endoscopy;  Laterality: N/A;  . COLONOSCOPY WITH PROPOFOL N/A 06/28/2016   Procedure: COLONOSCOPY WITH PROPOFOL;  Surgeon: Mauri Pole, MD;  Location: [place] [facility];  Service: Endoscopy;  Laterality: N/A;  . ESOPHAGOGASTRODUODENOSCOPY  July 2005   Dr. Irving Shows grade 2 esophagitis at the GE junction. Acute gastritis. Multiple acute active, deep, irregular, friable ulcers in the antrum and body of the stomach. 2 active, deep, friable, irregular ulcers in the duodenum. Pathology not available.  . ESOPHAGOGASTRODUODENOSCOPY N/A 01/18/2014   Procedure: ESOPHAGOGASTRODUODENOSCOPY (EGD);  Surgeon: Daneil Dolin, MD;  [facility];  Service: Endoscopy;  Laterality: N/A;  . ESOPHAGOGASTRODUODENOSCOPY (EGD) WITH PROPOFOL N/A 01/02/2017   Procedure: ESOPHAGOGASTRODUODENOSCOPY (EGD) WITH PROPOFOL;  Surgeon: Daneil Dolin, MD;  [facility];  Service: Endoscopy;  Laterality: N/A;  9:15am  . EXCISION ORAL TUMOR Left 08/19/2018   Procedure: EXCISION LEFT  BUCCAL MASS;  Surgeon: Leta Baptist, MD;  Location: [place];  Service: ENT;  Laterality: Left;  . IR ANGIOGRAM VISCERAL SELECTIVE  06/26/2016  . IR ANGIOGRAM VISCERAL SELECTIVE  06/26/2016  . IR ANGIOGRAM VISCERAL SELECTIVE  06/26/2016  . IR US GUIDE VASC ACCESS RIGHT  06/26/2016  . NOSE SURGERY    . POLYPECTOMY  06/24/2016   Procedure: POLYPECTOMY;  Surgeon: Danie Binder, MD;  [facility];  Service: Endoscopy;;  cecal  . THYROIDECTOMY    . TONSILLECTOMY         Family History  Problem Relation Age of Onset  . Hypertension Mother   . Heart disease Mother        before age 81  . Heart disease Father        before age 56  . Hyperlipidemia Sister   . Cancer Sister   . Colon cancer Neg Hx   . Liver disease Neg Hx     Social History   Tobacco Use  . Smoking status: Former Smoker    Years: 25.00    Types: Cigars    Quit date: 11/27/2006    Years since quitting: 12.5  . Smokeless tobacco: Never Used  . Tobacco comment: Quit in 2009  Substance Use Topics  . Alcohol use: Yes    Alcohol/week: 2.0 standard drinks    Types: 2 Cans of beer per week    Comment: occasioanal   . Drug use: Yes    Types: Marijuana    Comment: last smoked 04/2019    Home Medications Prior to Admission medications   Medication Sig Start Date End Date Taking? Authorizing Provider  Cholecalciferol (VITAMIN D3) 125 MCG (5000 UT) CAPS Take 5,000 Units by mouth in the morning.    Yes [provider]  ferrous sulfate 325 (65 FE) MG tablet Take 1 tablet (325 mg total) by mouth 2 (two) times daily with a meal. Patient taking differently: Take 325 mg by mouth daily with breakfast.  06/30/16  Yes Domenic Polite, MD  gabapentin (NEURONTIN) 300 MG capsule Take 900 mg by mouth at bedtime.  11/25/17  Yes [provider]  levothyroxine (SYNTHROID) 100 MCG tablet Take 100 mcg by mouth in the morning. 05/05/19  Yes [provider]  mirtazapine (REMERON) 15 MG tablet Take 15 mg  by mouth at bedtime.  12/23/17  Yes [provider]  Multiple Vitamin (MULTIVITAMIN WITH MINERALS) TABS tablet Take 1 tablet by mouth daily.   Yes [provider]  omeprazole (PRILOSEC) 20 MG capsule TAKE 1 CAPSULE EVERY DAY Patient taking differently: Take 20 mg by mouth in the morning.  12/27/15  Yes Mahala Menghini, PA-C  pravastatin (PRAVACHOL) 20 MG tablet Take 20 mg by mouth in the morning.    Yes [provider]    Allergies    Patient has no known allergies.  Review of Systems   Review of Systems All systems reviewed and negative, other than as noted in HPI.  Physical Exam Updated Vital Signs BP (!) 134/95   Pulse 93   Temp 98.3 F (36.8 C) (Oral)   Resp 20   Ht  6\' 2"  (1.88 m)   Wt 81.6 kg   SpO2 100%   BMI 23.11 kg/m   Physical Exam Vitals and nursing note reviewed.  Constitutional:      General: He is not in acute distress.    Appearance: He is well-developed.  HENT:     Head: Normocephalic and atraumatic.  Eyes:     General:        Right eye: No discharge.        Left eye: No discharge.     Conjunctiva/sclera: Conjunctivae normal.  Cardiovascular:     Rate and Rhythm: Normal rate and regular rhythm.     Heart sounds: Normal heart sounds. No murmur. No friction rub. No gallop.   Pulmonary:     Effort: Pulmonary effort is normal. No respiratory distress.     Breath sounds: Normal breath sounds.  Abdominal:     General: There is no distension.     Palpations: Abdomen is soft.     Tenderness: There is no abdominal tenderness.  Musculoskeletal:        General: No tenderness.     Cervical back: Neck supple.  Skin:    General: Skin is warm and dry.  Neurological:     Mental Status: He is alert.  Psychiatric:        Behavior: Behavior normal.        Thought Content: Thought content normal.     ED Results / Procedures / Treatments   Labs (all labs ordered are listed, but only abnormal results are displayed) Labs Reviewed    CBC WITH DIFFERENTIAL/PLATELET - Abnormal; Notable for the following components:      Result Value   Monocytes Absolute 1.2 (*)    All other components within normal limits  COMPREHENSIVE METABOLIC PANEL - Abnormal; Notable for the following components:   Glucose, Bld 111 (*)    GFR calc non Af Amer 59 (*)    All other components within normal limits  CBG MONITORING, ED - Abnormal; Notable for the following components:   Glucose-Capillary 112 (*)    All other components within normal limits  CBG MONITORING, ED    EKG EKG Interpretation  Date/Time:  Saturday June 19 2019 16:05:25 EDT Ventricular Rate:  93 PR Interval:    QRS Duration: 98 QT Interval:  353 QTC Calculation: 439 R Axis:   79 Text Interpretation: Sinus rhythm Atrial premature complex Short PR interval No significant change since last tracing Confirmed by Isla Pence [phone]) on 06/20/2019 2:36:09 PM   Radiology No results found.  Procedures Procedures (including critical care time)  Medications Ordered in ED Medications - No data to display  ED Course  I have reviewed the triage vital signs and the nursing notes.  Pertinent labs & imaging results that were available during my care of the patient were reviewed by me and considered in my medical decision making (see chart for details).    MDM Rules/Calculators/A&P                     74ym with non-exertional syncope. Back to baseline. Reassuring work-up. I doubt emergent cause.  Final Clinical Impression(s) / ED Diagnoses Final diagnoses:  Syncope, unspecified syncope type    Rx / DC Orders ED Discharge Orders    None       Virgel Manifold, MD 06/20/19 1550

## 2019-06-21 DIAGNOSIS — R634 Abnormal weight loss: Secondary | ICD-10-CM | POA: Diagnosis not present

## 2019-06-21 DIAGNOSIS — N320 Bladder-neck obstruction: Secondary | ICD-10-CM | POA: Diagnosis not present

## 2019-06-21 DIAGNOSIS — I10 Essential (primary) hypertension: Secondary | ICD-10-CM | POA: Diagnosis not present

## 2019-06-21 DIAGNOSIS — R55 Syncope and collapse: Secondary | ICD-10-CM | POA: Diagnosis not present

## 2019-06-21 DIAGNOSIS — Z6822 Body mass index (BMI) 22.0-22.9, adult: Secondary | ICD-10-CM | POA: Diagnosis not present

## 2019-06-21 DIAGNOSIS — E039 Hypothyroidism, unspecified: Secondary | ICD-10-CM | POA: Diagnosis not present

## 2019-06-22 ENCOUNTER — Ambulatory Visit: Payer: Medicare HMO | Attending: Internal Medicine

## 2019-06-22 DIAGNOSIS — Z23 Encounter for immunization: Secondary | ICD-10-CM

## 2019-06-22 NOTE — Progress Notes (Signed)
   Covid-19 Vaccination Clinic  Name:  Sean Leonard    MRN: [id number] DOB: 03/27/1945  06/22/2019  Sean Leonard was observed post Covid-19 immunization for 15 minutes without incident. He was provided with Vaccine Information Sheet and instruction to access the V-Safe system.   Sean Leonard was instructed to call 911 with any severe reactions post vaccine: Marland Kitchen Difficulty breathing  . Swelling of face and throat  . A fast heartbeat  . A bad rash all over body  . Dizziness and weakness   Immunizations Administered    Name Date Dose VIS Date Route   Moderna COVID-19 Vaccine 06/22/2019  1:42 PM 0.5 mL 02/23/2019 Intramuscular   Manufacturer: Moderna   Lot: [id number]   [id number]

## 2019-06-23 ENCOUNTER — Encounter: Payer: Self-pay | Admitting: Internal Medicine

## 2019-06-23 DIAGNOSIS — E7849 Other hyperlipidemia: Secondary | ICD-10-CM | POA: Diagnosis not present

## 2019-06-23 DIAGNOSIS — I10 Essential (primary) hypertension: Secondary | ICD-10-CM | POA: Diagnosis not present

## 2019-06-30 DIAGNOSIS — H524 Presbyopia: Secondary | ICD-10-CM | POA: Diagnosis not present

## 2019-06-30 DIAGNOSIS — H52209 Unspecified astigmatism, unspecified eye: Secondary | ICD-10-CM | POA: Diagnosis not present

## 2019-06-30 DIAGNOSIS — H5213 Myopia, bilateral: Secondary | ICD-10-CM | POA: Diagnosis not present

## 2019-07-06 ENCOUNTER — Other Ambulatory Visit: Payer: Self-pay

## 2019-07-06 ENCOUNTER — Ambulatory Visit: Payer: Medicare HMO | Admitting: Gastroenterology

## 2019-07-06 ENCOUNTER — Encounter: Payer: Self-pay | Admitting: Gastroenterology

## 2019-07-06 VITALS — BP 132/82 | HR 88 | Temp 97.0°F | Ht 74.0 in | Wt 174.0 lb

## 2019-07-06 DIAGNOSIS — R634 Abnormal weight loss: Secondary | ICD-10-CM

## 2019-07-06 DIAGNOSIS — R6881 Early satiety: Secondary | ICD-10-CM

## 2019-07-06 DIAGNOSIS — R630 Anorexia: Secondary | ICD-10-CM

## 2019-07-06 NOTE — Progress Notes (Signed)
Primary Care Physician:  Lavella Lemons, [place]  Primary Gastroenterologist:  Garfield Cornea, MD   Chief Complaint  Patient presents with  . Weight Loss    10lbs in 5-6 weeks, no appetite, denies dysphagia, no stomach problem    HPI:  Sean Leonard is a 74 y.o. male with history of thyroid disease, PUD, PVD, GI bleeding presenting for further evaluation of weight loss, poor appetite at the request of Aggie Hacker.   Patient last seen in 11/2016. H/O lower GI bleeding in 05/2016. Colonoscopy at that time with diverticula and cecal polyp s/p polypectomy. The patient had continued bleeding and tagged bleeding scan found no source of bleeding. Surgery recommended repeat colonoscopy with subtotal colectomy as last resort. Received 12 units of blood. Repeat colonoscopy found ascending colon ulcers s/p clipping which appeared to resolve bleeding. Surveillance colonoscopy recommended in 5-10 years for simple adenoma.  Patient had h/o biopsy proven Barrett's in 2014 and 2015. Last EGD for surveillance in 2018, not impressive for barrett's, bx with inflammation noted only. Advised 3 year surveillance EGD.  Patient reports poor appetite for 4 weeks. No n/v. No abdominal pain. No heartburn. No dysphagia. BM regular. No melena, brbpr. Patient has lost 20 pounds in the past one year. Denies NSAIDS/ASA.  CTA 09/2018: celiac, SMA, IMA patent. Normal liver, pancreas. Diverticulosis.    Patient seen in ED three weeks ago with syncopal episode. Episode of diaphoresis, felt hot, passed out. First and only episode. No sob or chest pain. Seen in ER 06/19/19. CBC, CMET unremarkable. Has appt with cardiology end of month.   Current Outpatient Medications  Medication Sig Dispense Refill  . Cholecalciferol (VITAMIN D3) 125 MCG (5000 UT) CAPS Take 5,000 Units by mouth in the morning.     . ferrous sulfate 325 (65 FE) MG tablet Take 1 tablet (325 mg total) by mouth 2 (two) times daily with a meal. (Patient taking  differently: Take 325 mg by mouth daily with breakfast. ) 60 tablet 2  . levothyroxine (SYNTHROID) 100 MCG tablet Take 100 mcg by mouth in the morning.    . Multiple Vitamin (MULTIVITAMIN WITH MINERALS) TABS tablet Take 1 tablet by mouth daily.    Marland Kitchen omeprazole (PRILOSEC) 20 MG capsule TAKE 1 CAPSULE EVERY DAY (Patient taking differently: Take 20 mg by mouth in the morning. ) 90 capsule 0  . pravastatin (PRAVACHOL) 20 MG tablet Take 20 mg by mouth in the morning.      No current facility-administered medications for this visit.    Allergies as of 07/06/2019  . (No Known Allergies)    Past Medical History:  Diagnosis Date  . DDD (degenerative disc disease), lumbar   . Diverticula of colon   . GERD (gastroesophageal reflux disease)   . Hyperthyroidism   . Hypothyroidism   . Iliac artery aneurysm, bilateral (HCC)    with significant stenosis of right internal iliac artery  . Peptic ulcer   . Prostatitis   . Thyroid disease     Past Surgical History:  Procedure Laterality Date  . BIOPSY  01/02/2017   Procedure: BIOPSY;  Surgeon: Daneil Dolin, MD;  [facility];  Service: Endoscopy;;  GASTRIC AND ESOPHAGEAL BIOPSIES  . COLONOSCOPY  April 2005   Dr. Irving Shows. Small internal hemorrhoids. Multiple small scattered diverticula in the cecum and descending colon.  . COLONOSCOPY N/A 01/18/2014   Procedure: COLONOSCOPY;  Surgeon: Daneil Dolin, MD;  [facility];  Service: Endoscopy;  Laterality:  N/A;  1100  . COLONOSCOPY N/A 06/23/2016   Procedure: COLONOSCOPY;  Surgeon: Rogene Houston, MD;  [facility];  Service: Endoscopy;  Laterality: N/A;  . COLONOSCOPY WITH ESOPHAGOGASTRODUODENOSCOPY (EGD) N/A 12/16/2012   Dr. Gala Romney: rectal and colonic polyps with inadequate preparation, tubular adenoma. EGD with biopsy proven short segment Barrett's esophagus, hiatal hernia, mild chronic inactive gastritis   . COLONOSCOPY WITH PROPOFOL N/A 06/24/2016   Procedure:  COLONOSCOPY WITH PROPOFOL;  Surgeon: Danie Binder, MD;  [facility];  Service: Endoscopy;  Laterality: N/A;  . COLONOSCOPY WITH PROPOFOL N/A 06/28/2016   Procedure: COLONOSCOPY WITH PROPOFOL;  Surgeon: Mauri Pole, MD;  Location: [facility];  Service: Endoscopy;  Laterality: N/A;  . ESOPHAGOGASTRODUODENOSCOPY  July 2005   Dr. Irving Shows grade 2 esophagitis at the GE junction. Acute gastritis. Multiple acute active, deep, irregular, friable ulcers in the antrum and body of the stomach. 2 active, deep, friable, irregular ulcers in the duodenum. Pathology not available.  . ESOPHAGOGASTRODUODENOSCOPY N/A 01/18/2014   Procedure: ESOPHAGOGASTRODUODENOSCOPY (EGD);  Surgeon: Daneil Dolin, MD;  [facility];  Service: Endoscopy;  Laterality: N/A;  . ESOPHAGOGASTRODUODENOSCOPY (EGD) WITH PROPOFOL N/A 01/02/2017   Procedure: ESOPHAGOGASTRODUODENOSCOPY (EGD) WITH PROPOFOL;  Surgeon: Daneil Dolin, MD;  [facility];  Service: Endoscopy;  Laterality: N/A;  9:15am  . EXCISION ORAL TUMOR Left 08/19/2018   Procedure: EXCISION LEFT BUCCAL MASS;  Surgeon: Leta Baptist, MD;  Location: [facility];  Service: ENT;  Laterality: Left;  . IR ANGIOGRAM VISCERAL SELECTIVE  06/26/2016  . IR ANGIOGRAM VISCERAL SELECTIVE  06/26/2016  . IR ANGIOGRAM VISCERAL SELECTIVE  06/26/2016  . IR US GUIDE VASC ACCESS RIGHT  06/26/2016  . NOSE SURGERY    . POLYPECTOMY  06/24/2016   Procedure: POLYPECTOMY;  Surgeon: Danie Binder, MD;  [facility];  Service: Endoscopy;;  cecal  . THYROIDECTOMY    . TONSILLECTOMY      Family History  Problem Relation Age of Onset  . Hypertension Mother   . Heart disease Mother        before age 19  . Heart disease Father        before age 36  . Hyperlipidemia Sister   . Cancer Sister   . Colon cancer Neg Hx   . Liver disease Neg Hx     Social History   Socioeconomic History  . Marital status: Single    Spouse name: Not on file   . Number of children: Not on file  . Years of education: Not on file  . Highest education level: Not on file  Occupational History  . Not on file  Tobacco Use  . Smoking status: Former Smoker    Years: 25.00    Types: Cigars    Quit date: 11/27/2006    Years since quitting: 12.6  . Smokeless tobacco: Never Used  . Tobacco comment: Quit in 2009  Substance and Sexual Activity  . Alcohol use: Yes    Alcohol/week: 2.0 standard drinks    Types: 2 Cans of beer per week    Comment: occasioanal   . Drug use: Yes    Types: Marijuana    Comment: once a week  . Sexual activity: Not on file  Other Topics Concern  . Not on file  Social History Narrative   1 deceased child. 1 living   Social Determinants of Health   Financial Resource Strain:   .  Difficulty of Paying Living Expenses:   Food Insecurity:   . Worried About Charity fundraiser in the Last Year:   . Arboriculturist in the Last Year:   Transportation Needs:   . Film/video editor (Medical):   Marland Kitchen Lack of Transportation (Non-Medical):   Physical Activity:   . Days of Exercise per Week:   . Minutes of Exercise per Session:   Stress:   . Feeling of Stress :   Social Connections:   . Frequency of Communication with Friends and Family:   . Frequency of Social Gatherings with Friends and Family:   . Attends Religious Services:   . Active Member of Clubs or Organizations:   . Attends Archivist Meetings:   Marland Kitchen Marital Status:   Intimate Partner Violence:   . Fear of Current or Ex-Partner:   . Emotionally Abused:   Marland Kitchen Physically Abused:   . Sexually Abused:       ROS:  General: Negative for fever, chills, fatigue, weakness.see hpi Eyes: Negative for vision changes.  ENT: Negative for hoarseness, difficulty swallowing , nasal congestion. CV: Negative for chest pain, angina, palpitations, dyspnea on exertion, peripheral edema.  Respiratory: Negative for dyspnea at rest, dyspnea on exertion, cough, sputum,  wheezing.  GI: See history of present illness. GU:  Negative for dysuria, hematuria, urinary incontinence, urinary frequency, nocturnal urination.  MS: Negative for joint pain, low back pain.  Derm: Negative for rash or itching.  Neuro: Negative for weakness, abnormal sensation, seizure, frequent headaches, memory loss, confusion.  Psych: Negative for anxiety, depression, suicidal ideation, hallucinations.  Endo:see hpi Heme: Negative for bruising or bleeding. Allergy: Negative for rash or hives.    Physical Examination:  BP 132/82   Pulse 88   Temp (!) 97 F (36.1 C) (Oral)   Ht '6\' 2"'$  (1.88 m)   Wt 174 lb (78.9 kg)   BMI 22.34 kg/m    General: Well-nourished, well-developed in no acute distress.  Head: Normocephalic, atraumatic.   Eyes: Conjunctiva pink, no icterus. Mouth: masked. Neck: Supple without thyromegaly, masses, or lymphadenopathy.  Lungs: Clear to auscultation bilaterally.  Heart: Regular rate and rhythm, no murmurs rubs or gallops.  Abdomen: Bowel sounds are normal, nontender, nondistended, no hepatosplenomegaly or masses, no abdominal bruits or    hernia , no rebound or guarding.   Rectal: not performed Extremities: No lower extremity edema. No clubbing or deformities.  Neuro: Alert and oriented x 4 , grossly normal neurologically.  Skin: Warm and dry, no rash or jaundice.   Psych: Alert and cooperative, normal mood and affect.  Labs: Labs from June 21, 2019: Glucose 94, creatinine 0.92, sodium 137, potassium 4.5, albumin 4.1, total bilirubin 0.5, alk phos 80, AST 19, ALT 15, white blood cell count 10,200, hemoglobin 13.2, platelets 318,000, TSH 1.94, lipase 38, PSA 3.9.  Imaging Studies: No results found.  Impression/Plan: Pleasant 74 y/o male presenting for further evaluation of weight loss and poor appetite. He has lost 20 pounds in the past year. For past four weeks he has had poor appetite with early satiety. Previous remote PUD, Barrett's esophagus  (due for surveillance this year). 09/2018 CTA without evidence of mesenteric ischemia or malignancy.   Recently had episode of syncope, cardiology work up pending for end of the month.   Given poor appetite, early satiety, weight loss, would consider updating EGD to rule out gastritis, PUD, malignancy, follow up Barrett's. Plan for EGD with propofol but will await cardiology evaluation  prior to scheduling. Cannot exclude possibility of needing CT prior to EGD to exclude malignancy. Further recommendations to follow pending cardiology evaluation.   In interim, he will continue PPI daily. Encouraged oral intake. He is taking in extra supplements, Ensure daily.

## 2019-07-06 NOTE — Patient Instructions (Signed)
1. For weight loss, poor appetite we will need to complete an upper endoscopy. Given your pending work up with cardiology, we will need to wait to schedule AFTER you have seen cardiology.  2. I will discuss you case with Dr. Gala Romney in the meantime to see if he would like to pursue CT scan of your abdomen while we wait for upper endoscopy.  3. We will be in touch with further recommendations.

## 2019-07-11 ENCOUNTER — Encounter: Payer: Self-pay | Admitting: Gastroenterology

## 2019-07-23 ENCOUNTER — Encounter: Payer: Self-pay | Admitting: Cardiology

## 2019-07-23 ENCOUNTER — Ambulatory Visit (INDEPENDENT_AMBULATORY_CARE_PROVIDER_SITE_OTHER): Payer: Medicare HMO | Admitting: Cardiology

## 2019-07-23 ENCOUNTER — Other Ambulatory Visit: Payer: Self-pay

## 2019-07-23 VITALS — BP 120/58 | HR 84 | Temp 97.3°F | Ht 74.0 in | Wt 172.0 lb

## 2019-07-23 DIAGNOSIS — E039 Hypothyroidism, unspecified: Secondary | ICD-10-CM | POA: Diagnosis not present

## 2019-07-23 DIAGNOSIS — Z87898 Personal history of other specified conditions: Secondary | ICD-10-CM | POA: Diagnosis not present

## 2019-07-23 DIAGNOSIS — I739 Peripheral vascular disease, unspecified: Secondary | ICD-10-CM

## 2019-07-23 DIAGNOSIS — M792 Neuralgia and neuritis, unspecified: Secondary | ICD-10-CM | POA: Diagnosis not present

## 2019-07-23 DIAGNOSIS — E7849 Other hyperlipidemia: Secondary | ICD-10-CM | POA: Diagnosis not present

## 2019-07-23 NOTE — Progress Notes (Signed)
Cardiology Office Note  Date: 07/23/2019   ID: DRAYCE HOLLINGS, DOB 1946/03/16, MRN [id number]  PCP:  Lavella Lemons, PA  Cardiologist:  Rozann Lesches, MD Electrophysiologist:  None   Chief Complaint  Patient presents with  . History of syncope    History of Present Illness: Sean Leonard is a 74 y.o. male referred for cardiology consultation by Ms. Georgianne Fick with [facility] for the evaluation of syncope.  I reviewed available records.  He was in the [facility] ER back in March after an episode of reported syncope that occurred while he was seated and preceded by a feeling of heat and diaphoresis, no chest pain or palpitations.  No acute issues were uncovered during his ER evaluation.  We discussed the symptoms today.  He states that he felt well, had stopped at a local store to sit and talk with some friends.  He suddenly broke out in a cold sweat and felt like he could not hold his head up, had a syncopal event thereafter.  When he came to he was weak but otherwise had no other symptoms.  He states that this has never occurred in the past, and has not occurred since that time.  He does not report any palpitations otherwise, no exertional chest pain.  He follows with Dr. Donnetta Hutching at [facility].  He has a history of PAD with bilateral common and internal iliac artery aneurysms.  He does not describe any progressive claudication at this time.  I reviewed his medications which are outlined below.  Past Medical History:  Diagnosis Date  . DDD (degenerative disc disease), lumbar   . Diverticula of colon   . GERD (gastroesophageal reflux disease)   . Hypothyroidism   . Iliac artery aneurysm, bilateral ([place])   . PAD (peripheral artery disease) ([place])   . Peptic ulcer   . Prostatitis     Past Surgical History:  Procedure Laterality Date  . BIOPSY  01/02/2017   Procedure: BIOPSY;  Surgeon: Daneil Dolin, MD;  [facility];  Service: Endoscopy;;  GASTRIC AND ESOPHAGEAL  BIOPSIES  . COLONOSCOPY  April 2005   Dr. Irving Shows. Small internal hemorrhoids. Multiple small scattered diverticula in the cecum and descending colon.  . COLONOSCOPY N/A 01/18/2014   Procedure: COLONOSCOPY;  Surgeon: Daneil Dolin, MD;  [facility];  Service: Endoscopy;  Laterality: N/A;  1100  . COLONOSCOPY N/A 06/23/2016   Procedure: COLONOSCOPY;  Surgeon: Rogene Houston, MD;  [facility];  Service: Endoscopy;  Laterality: N/A;  . COLONOSCOPY WITH ESOPHAGOGASTRODUODENOSCOPY (EGD) N/A 12/16/2012   Dr. Gala Romney: rectal and colonic polyps with inadequate preparation, tubular adenoma. EGD with biopsy proven short segment Barrett's esophagus, hiatal hernia, mild chronic inactive gastritis   . COLONOSCOPY WITH PROPOFOL N/A 06/24/2016   Procedure: COLONOSCOPY WITH PROPOFOL;  Surgeon: Danie Binder, MD;  [facility];  Service: Endoscopy;  Laterality: N/A;  . COLONOSCOPY WITH PROPOFOL N/A 06/28/2016   Procedure: COLONOSCOPY WITH PROPOFOL;  Surgeon: Mauri Pole, MD;  Location: [facility];  Service: Endoscopy;  Laterality: N/A;  . ESOPHAGOGASTRODUODENOSCOPY  July 2005   Dr. Irving Shows grade 2 esophagitis at the GE junction. Acute gastritis. Multiple acute active, deep, irregular, friable ulcers in the antrum and body of the stomach. 2 active, deep, friable, irregular ulcers in the duodenum. Pathology not available.  . ESOPHAGOGASTRODUODENOSCOPY N/A 01/18/2014   Procedure: ESOPHAGOGASTRODUODENOSCOPY (EGD);  Surgeon: Daneil Dolin, MD;  [facility];  Service: Endoscopy;  Laterality: N/A;  . ESOPHAGOGASTRODUODENOSCOPY (EGD) WITH PROPOFOL N/A 01/02/2017   Rourk: Mucosal changes in the esophagus, undilated Z-line.  Small hiatal hernia.  Some snake skinning or fish scale appearance to the gastric mucosa.  Gastric biopsy with slight chronic inflammation, no H. pylori.  Esophageal biopsy with mild inflammation consistent with reflux.  Marland Kitchen EXCISION ORAL TUMOR  Left 08/19/2018   Procedure: EXCISION LEFT BUCCAL MASS;  Surgeon: Leta Baptist, MD;  Location: [facility];  Service: ENT;  Laterality: Left;  . IR ANGIOGRAM VISCERAL SELECTIVE  06/26/2016  . IR ANGIOGRAM VISCERAL SELECTIVE  06/26/2016  . IR ANGIOGRAM VISCERAL SELECTIVE  06/26/2016  . IR US GUIDE VASC ACCESS RIGHT  06/26/2016  . NOSE SURGERY    . POLYPECTOMY  06/24/2016   Procedure: POLYPECTOMY;  Surgeon: Danie Binder, MD;  [facility];  Service: Endoscopy;;  cecal  . THYROIDECTOMY    . TONSILLECTOMY      Current Outpatient Medications  Medication Sig Dispense Refill  . Cholecalciferol (VITAMIN D3) 125 MCG (5000 UT) CAPS Take 5,000 Units by mouth in the morning.     . ferrous sulfate 325 (65 FE) MG tablet Take 1 tablet (325 mg total) by mouth 2 (two) times daily with a meal. (Patient taking differently: Take 325 mg by mouth daily with breakfast. ) 60 tablet 2  . levothyroxine (SYNTHROID) 100 MCG tablet Take 100 mcg by mouth in the morning.    . Multiple Vitamin (MULTIVITAMIN WITH MINERALS) TABS tablet Take 1 tablet by mouth daily.    Marland Kitchen omeprazole (PRILOSEC) 20 MG capsule TAKE 1 CAPSULE EVERY DAY (Patient taking differently: Take 20 mg by mouth in the morning. ) 90 capsule 0  . pravastatin (PRAVACHOL) 20 MG tablet Take 20 mg by mouth in the morning.      No current facility-administered medications for this visit.   Allergies:  Patient has no known allergies.   ROS:  No exertional chest pain, no orthopnea or PND.  Physical Exam: VS:  BP (!) 120/58 (BP Location: Right Arm)   Pulse 84   Temp (!) 97.3 F (36.3 C)   Ht 6\' 2"  (1.88 m)   Wt 172 lb (78 kg)   BMI 22.08 kg/m , BMI Body mass index is 22.08 kg/m.  Wt Readings from Last 3 Encounters:  07/23/19 172 lb (78 kg)  07/06/19 174 lb (78.9 kg)  06/19/19 180 lb (81.6 kg)    General: Patient appears comfortable at rest. HEENT: Conjunctiva and lids normal, wearing a mask. Neck: Supple, no elevated JVP or carotid  bruits, no thyromegaly. Lungs: Clear to auscultation, nonlabored breathing at rest. Cardiac: Regular rate and rhythm with occasional ectopic beat, no S3, soft systolic murmur. Abdomen: Soft, nontender, bowel sounds present. Extremities: No pitting edema, distal pulses 2+. Skin: Warm and dry. Musculoskeletal: No kyphosis. Neuropsychiatric: Alert and oriented x3, affect grossly appropriate.  ECG:  An ECG dated 06/19/2019 was personally reviewed today and demonstrated:  Sinus rhythm with PAC.  Recent Labwork: 06/19/2019: ALT 17; AST 18; BUN 22; Creatinine, Ser 1.22; Hemoglobin 13.3; Platelets 341; Potassium 4.4; Sodium 136     Component Value Date/Time   CHOL 99 06/24/2016 0423   TRIG 175 (H) 06/24/2016 0423   HDL 15 (L) 06/24/2016 0423   CHOLHDL 6.6 06/24/2016 0423   VLDL 35 06/24/2016 0423   LDLCALC 49 06/24/2016 0423    Other Studies Reviewed Today:  CTA abdomen and pelvis 10/02/2018:  IMPRESSION: VASCULAR  1. Bilateral common iliac artery aneurysms are stable. Right common iliac artery aneurysm measures 2.5 cm. Left common iliac artery aneurysm measures 2.0 cm. 2. Bilateral internal iliac artery aneurysms. Large amount of mural thrombus associated with the internal iliac artery aneurysms. Right internal iliac artery aneurysm measures 2.3 cm and previously measured 2.1 cm. Again noted are areas of stenosis involving the right internal iliac artery aneurysm. Left internal iliac artery aneurysm measures 2.3 cm and previously measured 2.1 cm. 3.  Aortic Atherosclerosis (ICD10-I70.0).  NON-VASCULAR  1. No acute abnormality in the abdomen or pelvis. 2. Stable nodularity involving the adrenal glands. Findings likely related to hyperplasia and/or small adenomas. 3. Right renal cysts. 4. Extensive degenerative changes in the lumbar spine.  Assessment and Plan:  1.  Episode of syncope as outlined above.  Etiology not clear but given sudden onset and while seated, possible  transient arrhythmia is to be considered.  He has had no prior or subsequent episodes of syncope.  No exertional symptoms, no reported palpitations at baseline.  ECG is nonspecific with single PAC.  I reviewed his lab work.  Plan will be to obtain a 7-day Zio patch and also an echocardiogram.  Further recommendations to follow.  2.  PAD with history of bilateral common and internal iliac artery aneurysms.  He has clinically stable and following with Dr. Donnetta Hutching.  Medication Adjustments/Labs and Tests Ordered: Current medicines are reviewed at length with the patient today.  Concerns regarding medicines are outlined above.   Tests Ordered: Orders Placed This Encounter  Procedures  . Cardiac event monitor  . ECHOCARDIOGRAM COMPLETE    Medication Changes: No orders of the defined types were placed in this encounter.   Disposition:  Follow up test results.  Signed, Satira Sark, MD, [facility] 07/23/2019 11:00 AM    [place] [facility] [address] [place], [place] [address] Phone: [phone]; Fax: [phone]

## 2019-07-23 NOTE — Patient Instructions (Signed)
Medication Instructions: Your physician recommends that you continue on your current medications as directed. Please refer to the Current Medication list given to you today.   Labwork: None today  Procedures/Testing: Your physician has recommended that you wear an event monitor. Event monitors are medical devices that record the heart's electrical activity. Doctors most often [place] these monitors to diagnose arrhythmias. Arrhythmias are problems with the speed or rhythm of the heartbeat. The monitor is a small, portable device. You can wear one while you do your normal daily activities. This is usually used to diagnose what is causing palpitations/syncope (passing out).  Your physician has requested that you have an echocardiogram. Echocardiography is a painless test that uses sound waves to create images of your heart. It provides your doctor with information about the size and shape of your heart and how well your heart's chambers and valves are working. This procedure takes approximately one hour. There are no restrictions for this procedure.    Follow-Up: We will call you with results  Any Additional Special Instructions Will Be Listed Below (If Applicable).      Thank you for choosing [facility] !          If you need a refill on your cardiac medications before your next appointment, please call your pharmacy.

## 2019-08-02 ENCOUNTER — Ambulatory Visit (HOSPITAL_COMMUNITY)
Admission: RE | Admit: 2019-08-02 | Discharge: 2019-08-02 | Disposition: A | Discharge status: Home / Self Care | Payer: Medicare HMO | Source: Ambulatory Visit | Attending: Cardiology | Admitting: Cardiology

## 2019-08-02 ENCOUNTER — Other Ambulatory Visit: Payer: Self-pay

## 2019-08-02 DIAGNOSIS — I739 Peripheral vascular disease, unspecified: Secondary | ICD-10-CM | POA: Insufficient documentation

## 2019-08-02 DIAGNOSIS — R55 Syncope and collapse: Secondary | ICD-10-CM | POA: Diagnosis not present

## 2019-08-02 DIAGNOSIS — K219 Gastro-esophageal reflux disease without esophagitis: Secondary | ICD-10-CM | POA: Diagnosis not present

## 2019-08-02 DIAGNOSIS — Z87898 Personal history of other specified conditions: Secondary | ICD-10-CM | POA: Diagnosis not present

## 2019-08-02 DIAGNOSIS — I083 Combined rheumatic disorders of mitral, aortic and tricuspid valves: Secondary | ICD-10-CM | POA: Insufficient documentation

## 2019-08-02 NOTE — Progress Notes (Signed)
*  PRELIMINARY RESULTS* Echocardiogram 2D Echocardiogram has been performed.  Sean Leonard 08/02/2019, 10:33 AM

## 2019-08-13 DIAGNOSIS — R55 Syncope and collapse: Secondary | ICD-10-CM | POA: Diagnosis not present

## 2019-08-19 ENCOUNTER — Ambulatory Visit (INDEPENDENT_AMBULATORY_CARE_PROVIDER_SITE_OTHER): Payer: Medicare HMO

## 2019-08-19 DIAGNOSIS — R55 Syncope and collapse: Secondary | ICD-10-CM | POA: Diagnosis not present

## 2019-08-19 DIAGNOSIS — Z87898 Personal history of other specified conditions: Secondary | ICD-10-CM

## 2019-09-01 ENCOUNTER — Other Ambulatory Visit: Payer: Self-pay

## 2019-09-01 ENCOUNTER — Ambulatory Visit: Payer: Medicare HMO | Admitting: Nurse Practitioner

## 2019-09-01 ENCOUNTER — Telehealth: Payer: Self-pay

## 2019-09-01 ENCOUNTER — Encounter: Payer: Self-pay | Admitting: Nurse Practitioner

## 2019-09-01 VITALS — BP 137/83 | HR 85 | Temp 97.0°F | Ht 74.0 in | Wt 167.6 lb

## 2019-09-01 DIAGNOSIS — R634 Abnormal weight loss: Secondary | ICD-10-CM | POA: Diagnosis not present

## 2019-09-01 DIAGNOSIS — K2270 Barrett's esophagus without dysplasia: Secondary | ICD-10-CM

## 2019-09-01 NOTE — Progress Notes (Signed)
CC'ED TO PCP 

## 2019-09-01 NOTE — Telephone Encounter (Signed)
[employer]# for CT: [id number], valid 09/20/19-10/20/19.

## 2019-09-01 NOTE — Patient Instructions (Signed)
Your health issues we discussed today were:   Weight loss: 1. Because of your progressive symptoms, and because cardiology did not find anything significant, we will proceed with an upper endoscopy at this time 2. I will also put in for CT scan of your abdomen to check for other issues 3. Have your labs drawn when you are able to 4. Increase your Ensure/protein shakes to twice a day 5. Call us for any worsening or severe symptoms  Overall I recommend:  1. Continue your other current medications 2. Return for follow-up in 3 months 3. Call us for any questions or concerns.   ---------------------------------------------------------------  I am glad you have gotten your COVID-19 vaccination!  Even though you are fully vaccinated you should continue to follow CDC and state/local guidelines.  ---------------------------------------------------------------   At [facility] we value your feedback. You may receive a survey about your visit today. Please share your experience as we strive to create trusting relationships with our patients to provide genuine, compassionate, quality care.  We appreciate your understanding and patience as we review any laboratory studies, imaging, and other diagnostic tests that are ordered as we care for you. Our office policy is 5 business days for review of these results, and any emergent or urgent results are addressed in a timely manner for your best interest. If you do not hear from our office in 1 week, please contact us.   We also encourage the use of MyChart, which contains your medical information for your review as well. If you are not enrolled in this feature, an access code is on this after visit summary for your convenience. Thank you for allowing [place] to be involved in your care.  It was great to see you today!  I hope you have a great Summer!!

## 2019-09-01 NOTE — Assessment & Plan Note (Signed)
History of Barrett's esophagus, currently due for EGD.  Was initially on hold pending cardiology evaluation for recent syncopal episode.  Cardiology has seen him and done ultrasound and 7-day monitoring as per HPI.  Essentially deemed no further cardiac work-up unless recurrent episode.  We can proceed with colonoscopy at this time as per below.

## 2019-09-01 NOTE — H&P (View-Only) (Signed)
Referring Provider: Lavella Lemons, PA Primary Care Physician:  Lavella Lemons, PA Primary GI:  Dr. Gala Romney  Chief Complaint  Patient presents with  . Anemia    f/u  . Weight Loss    no appetite    HPI:   Sean Leonard is a 74 y.o. male who presents for follow-up on weight loss, early satiety, anorexia.  The patient was last seen in our office 07/06/2019.  At that time noted poor appetite.  History of lower GI bleed in 2018 with eventually finding ascending colon ulcer status post clipping which appeared to resolve bleeding after a total of 12 units of blood received.  Recommended repeat colonoscopy in 5 to 10 years (2023-2028).  Previous biopsy-proven Barrett's in 2014 and 2015 with last EGD in 2018 not impressive for Barrett's and biopsies noting inflammation only.  Advised 3-year surveillance (2021).  At his last visit noted poor appetite for 4 weeks without abdominal pain, nausea/vomiting, heartburn, dysphagia.  Regular bowel movements, no obvious bleeding.  Noted 20 pound weight loss in the past year.  CTA in July 2020 found patent celiac, SMA, IMA.  Normal liver, pancreas.  Diverticulosis noted.  Recent ER visit for syncopal episode on 06/19/2019 with normal CBC and CMP.  ER recommended cardiology follow-up.  Recommended planned update EGD with MAC sedation after evaluation by cardiology.  Query eventual CT prior to EGD to exclude malignancy.  In the interim continue PPI, encourage oral intake, supplements, Ensure daily.  Patient did see cardiology 07/23/2019.  Recommended echocardiogram and 7-day monitoring.  Echocardiogram was completed and found normal LVEF.  Completed 7-day monitoring found frequent PACs but nothing no plain syncope.  Overall generally reassuring cardiology recommended no further cardiac work-up unless recurrent syncopal episode.  Today he states he's feels ok overall. Has been losing more weight (subjectively). Objectively he is down 7 lb in 1.5 months. Appetite  is poor. When he took Mirtazapine he had a good appetite, but he was told to stop these. Is drinking Ensure and Protein shakes once a day. Denies abdominal pain, N/V, hematochezia, melena, fever, chills. Denies URI or flu-like symptoms. Denies loss of sense of taste or smell. The patient has received COVID-19 vaccination(s). Denies chest pain, dyspnea, dizziness, lightheadedness, syncope, near syncope. Denies any other upper or lower GI symptoms.  The patient notes his TSH was last checked a couple months ago and was told it is normal.  Past Medical History:  Diagnosis Date  . DDD (degenerative disc disease), lumbar   . Diverticula of colon   . GERD (gastroesophageal reflux disease)   . Hypothyroidism   . Iliac artery aneurysm, bilateral ([place])   . PAD (peripheral artery disease) ([place])   . Peptic ulcer   . Prostatitis     Past Surgical History:  Procedure Laterality Date  . BIOPSY  01/02/2017   Procedure: BIOPSY;  Surgeon: Daneil Dolin, MD;  [facility];  Service: Endoscopy;;  GASTRIC AND ESOPHAGEAL BIOPSIES  . COLONOSCOPY  April 2005   Dr. Irving Shows. Small internal hemorrhoids. Multiple small scattered diverticula in the cecum and descending colon.  . COLONOSCOPY N/A 01/18/2014   Procedure: COLONOSCOPY;  Surgeon: Daneil Dolin, MD;  [facility];  Service: Endoscopy;  Laterality: N/A;  1100  . COLONOSCOPY N/A 06/23/2016   Procedure: COLONOSCOPY;  Surgeon: Rogene Houston, MD;  [facility];  Service: Endoscopy;  Laterality: N/A;  . COLONOSCOPY WITH ESOPHAGOGASTRODUODENOSCOPY (EGD) N/A 12/16/2012  Dr. Gala Romney: rectal and colonic polyps with inadequate preparation, tubular adenoma. EGD with biopsy proven short segment Barrett's esophagus, hiatal hernia, mild chronic inactive gastritis   . COLONOSCOPY WITH PROPOFOL N/A 06/24/2016   Procedure: COLONOSCOPY WITH PROPOFOL;  Surgeon: Danie Binder, MD;  [facility];  Service: Endoscopy;  Laterality:  N/A;  . COLONOSCOPY WITH PROPOFOL N/A 06/28/2016   Procedure: COLONOSCOPY WITH PROPOFOL;  Surgeon: Mauri Pole, MD;  Location: [facility];  Service: Endoscopy;  Laterality: N/A;  . ESOPHAGOGASTRODUODENOSCOPY  July 2005   Dr. Irving Shows grade 2 esophagitis at the GE junction. Acute gastritis. Multiple acute active, deep, irregular, friable ulcers in the antrum and body of the stomach. 2 active, deep, friable, irregular ulcers in the duodenum. Pathology not available.  . ESOPHAGOGASTRODUODENOSCOPY N/A 01/18/2014   Procedure: ESOPHAGOGASTRODUODENOSCOPY (EGD);  Surgeon: Daneil Dolin, MD;  [facility];  Service: Endoscopy;  Laterality: N/A;  . ESOPHAGOGASTRODUODENOSCOPY (EGD) WITH PROPOFOL N/A 01/02/2017   Rourk: Mucosal changes in the esophagus, undilated Z-line.  Small hiatal hernia.  Some snake skinning or fish scale appearance to the gastric mucosa.  Gastric biopsy with slight chronic inflammation, no H. pylori.  Esophageal biopsy with mild inflammation consistent with reflux.  Marland Kitchen EXCISION ORAL TUMOR Left 08/19/2018   Procedure: EXCISION LEFT BUCCAL MASS;  Surgeon: Leta Baptist, MD;  Location: [place];  Service: ENT;  Laterality: Left;  . IR ANGIOGRAM VISCERAL SELECTIVE  06/26/2016  . IR ANGIOGRAM VISCERAL SELECTIVE  06/26/2016  . IR ANGIOGRAM VISCERAL SELECTIVE  06/26/2016  . IR US GUIDE VASC ACCESS RIGHT  06/26/2016  . NOSE SURGERY    . POLYPECTOMY  06/24/2016   Procedure: POLYPECTOMY;  Surgeon: Danie Binder, MD;  [facility];  Service: Endoscopy;;  cecal  . THYROIDECTOMY    . TONSILLECTOMY      Current Outpatient Medications  Medication Sig Dispense Refill  . Cholecalciferol (VITAMIN D3) 125 MCG (5000 UT) CAPS Take 5,000 Units by mouth in the morning.     . ferrous sulfate 325 (65 FE) MG tablet Take 1 tablet (325 mg total) by mouth 2 (two) times daily with a meal. (Patient taking differently: Take 325 mg by mouth daily with breakfast. ) 60 tablet 2  .  levothyroxine (SYNTHROID) 100 MCG tablet Take 100 mcg by mouth in the morning.    . Multiple Vitamin (MULTIVITAMIN WITH MINERALS) TABS tablet Take 1 tablet by mouth daily.    Marland Kitchen omeprazole (PRILOSEC) 20 MG capsule TAKE 1 CAPSULE EVERY DAY (Patient taking differently: Take 20 mg by mouth in the morning. ) 90 capsule 0  . pravastatin (PRAVACHOL) 20 MG tablet Take 20 mg by mouth in the morning.      No current facility-administered medications for this visit.    Allergies as of 09/01/2019  . (No Known Allergies)    Family History  Problem Relation Age of Onset  . Hypertension Mother   . Heart disease Mother        before age 58  . Heart disease Father        before age 62  . Hyperlipidemia Sister   . Cancer Sister   . Colon cancer Neg Hx   . Liver disease Neg Hx     Social History   Socioeconomic History  . Marital status: Single    Spouse name: Not on file  . Number of children: Not on file  . Years of education: Not on file  .  Highest education level: Not on file  Occupational History  . Not on file  Tobacco Use  . Smoking status: Former Smoker    Years: 25.00    Types: Cigars    Quit date: 11/27/2006    Years since quitting: 12.7  . Smokeless tobacco: Never Used  . Tobacco comment: Quit in 2009  Substance and Sexual Activity  . Alcohol use: Yes    Alcohol/week: 2.0 standard drinks    Types: 2 Cans of beer per week    Comment: occasioanal   . Drug use: Yes    Frequency: 5.0 times per week    Types: Marijuana  . Sexual activity: Not on file  Other Topics Concern  . Not on file  Social History Narrative   1 deceased child. 1 living   Social Determinants of Health   Financial Resource Strain:   . Difficulty of Paying Living Expenses:   Food Insecurity:   . Worried About Charity fundraiser in the Last Year:   . Arboriculturist in the Last Year:   Transportation Needs:   . Film/video editor (Medical):   Marland Kitchen Lack of Transportation (Non-Medical):   Physical  Activity:   . Days of Exercise per Week:   . Minutes of Exercise per Session:   Stress:   . Feeling of Stress :   Social Connections:   . Frequency of Communication with Friends and Family:   . Frequency of Social Gatherings with Friends and Family:   . Attends Religious Services:   . Active Member of Clubs or Organizations:   . Attends Archivist Meetings:   Marland Kitchen Marital Status:     Subjective: Review of Systems  Constitutional: Positive for weight loss. Negative for chills, fever and malaise/fatigue.  HENT: Negative for congestion and sore throat.   Respiratory: Negative for cough and shortness of breath.   Cardiovascular: Negative for chest pain and palpitations.  Gastrointestinal: Negative for abdominal pain, blood in stool, diarrhea, melena, nausea and vomiting.  Musculoskeletal: Negative for joint pain and myalgias.  Skin: Negative for rash.  Neurological: Negative for dizziness and weakness.  Endo/Heme/Allergies: Does not bruise/bleed easily.  Psychiatric/Behavioral: Negative for depression. The patient is not nervous/anxious.   All other systems reviewed and are negative.    Objective: BP 137/83   Pulse 85   Temp (!) 97 F (36.1 C) (Oral)   Ht _0  (1.88 m)   Wt 167 lb 9.6 oz (76 kg)   BMI 21.52 kg/m  Physical Exam Vitals and nursing note reviewed.  Constitutional:      General: He is not in acute distress.    Appearance: Normal appearance. He is normal weight. He is not ill-appearing, toxic-appearing or diaphoretic.  HENT:     Head: Normocephalic and atraumatic.     Nose: No congestion or rhinorrhea.  Eyes:     General: No scleral icterus. Cardiovascular:     Rate and Rhythm: Normal rate and regular rhythm.     Heart sounds: Normal heart sounds.  Pulmonary:     Effort: Pulmonary effort is normal.     Breath sounds: Normal breath sounds.  Abdominal:     General: Bowel sounds are normal. There is no distension.     Palpations: Abdomen is soft.  There is no hepatomegaly, splenomegaly or mass.     Tenderness: There is no abdominal tenderness. There is no guarding or rebound.     Hernia: No hernia is present.  Musculoskeletal:  Cervical back: Neck supple.  Skin:    General: Skin is warm and dry.     Coloration: Skin is not jaundiced.     Findings: No bruising or rash.  Neurological:     General: No focal deficit present.     Mental Status: He is alert and oriented to person, place, and time. Mental status is at baseline.  Psychiatric:        Mood and Affect: Mood normal.        Behavior: Behavior normal.        Thought Content: Thought content normal.       09/01/2019 8:29 AM   Disclaimer: This note was dictated with voice recognition software. Similar sounding words can inadvertently be transcribed and may not be corrected upon review.

## 2019-09-01 NOTE — Progress Notes (Signed)
Referring Provider: Lavella Lemons, PA Primary Care Physician:  Lavella Lemons, PA Primary GI:  Dr. Gala Romney  Chief Complaint  Patient presents with   Anemia    f/u   Weight Loss    no appetite    HPI:   Sean Leonard is a 74 y.o. male who presents for follow-up on weight loss, early satiety, anorexia.  The patient was last seen in our office 07/06/2019.  At that time noted poor appetite.  History of lower GI bleed in 2018 with eventually finding ascending colon ulcer status post clipping which appeared to resolve bleeding after a total of 12 units of blood received.  Recommended repeat colonoscopy in 5 to 10 years (2023-2028).  Previous biopsy-proven Barrett's in 2014 and 2015 with last EGD in 2018 not impressive for Barrett's and biopsies noting inflammation only.  Advised 3-year surveillance (2021).  At his last visit noted poor appetite for 4 weeks without abdominal pain, nausea/vomiting, heartburn, dysphagia.  Regular bowel movements, no obvious bleeding.  Noted 20 pound weight loss in the past year.  CTA in July 2020 found patent celiac, SMA, IMA.  Normal liver, pancreas.  Diverticulosis noted.  Recent ER visit for syncopal episode on 06/19/2019 with normal CBC and CMP.  ER recommended cardiology follow-up.  Recommended planned update EGD with MAC sedation after evaluation by cardiology.  Query eventual CT prior to EGD to exclude malignancy.  In the interim continue PPI, encourage oral intake, supplements, Ensure daily.  Patient did see cardiology 07/23/2019.  Recommended echocardiogram and 7-day monitoring.  Echocardiogram was completed and found normal LVEF.  Completed 7-day monitoring found frequent PACs but nothing no plain syncope.  Overall generally reassuring cardiology recommended no further cardiac work-up unless recurrent syncopal episode.  Today he states he's feels ok overall. Has been losing more weight (subjectively). Objectively he is down 7 lb in 1.5 months. Appetite  is poor. When he took Mirtazapine he had a good appetite, but he was told to stop these. Is drinking Ensure and Protein shakes once a day. Denies abdominal pain, N/V, hematochezia, melena, fever, chills. Denies URI or flu-like symptoms. Denies loss of sense of taste or smell. The patient has received COVID-19 vaccination(s). Denies chest pain, dyspnea, dizziness, lightheadedness, syncope, near syncope. Denies any other upper or lower GI symptoms.  The patient notes his TSH was last checked a couple months ago and was told it is normal.  Past Medical History:  Diagnosis Date   DDD (degenerative disc disease), lumbar    Diverticula of colon    GERD (gastroesophageal reflux disease)    Hypothyroidism    Iliac artery aneurysm, bilateral ([facility])    PAD (peripheral artery disease) ([place])    Peptic ulcer    Prostatitis     Past Surgical History:  Procedure Laterality Date   BIOPSY  01/02/2017   Procedure: BIOPSY;  Surgeon: Daneil Dolin, MD;  [facility];  Service: Endoscopy;;  GASTRIC AND ESOPHAGEAL BIOPSIES   COLONOSCOPY  April 2005   Dr. Irving Shows. Small internal hemorrhoids. Multiple small scattered diverticula in the cecum and descending colon.   COLONOSCOPY N/A 01/18/2014   Procedure: COLONOSCOPY;  Surgeon: Daneil Dolin, MD;  [facility];  Service: Endoscopy;  Laterality: N/A;  1100   COLONOSCOPY N/A 06/23/2016   Procedure: COLONOSCOPY;  Surgeon: Rogene Houston, MD;  [facility];  Service: Endoscopy;  Laterality: N/A;   COLONOSCOPY WITH ESOPHAGOGASTRODUODENOSCOPY (EGD) N/A 12/16/2012  Dr. Gala Romney: rectal and colonic polyps with inadequate preparation, tubular adenoma. EGD with biopsy proven short segment Barrett's esophagus, hiatal hernia, mild chronic inactive gastritis    COLONOSCOPY WITH PROPOFOL N/A 06/24/2016   Procedure: COLONOSCOPY WITH PROPOFOL;  Surgeon: Danie Binder, MD;  [facility];  Service: Endoscopy;  Laterality:  N/A;   COLONOSCOPY WITH PROPOFOL N/A 06/28/2016   Procedure: COLONOSCOPY WITH PROPOFOL;  Surgeon: Mauri Pole, MD;  Location: [facility];  Service: Endoscopy;  Laterality: N/A;   ESOPHAGOGASTRODUODENOSCOPY  July 2005   Dr. Irving Shows grade 2 esophagitis at the GE junction. Acute gastritis. Multiple acute active, deep, irregular, friable ulcers in the antrum and body of the stomach. 2 active, deep, friable, irregular ulcers in the duodenum. Pathology not available.   ESOPHAGOGASTRODUODENOSCOPY N/A 01/18/2014   Procedure: ESOPHAGOGASTRODUODENOSCOPY (EGD);  Surgeon: Daneil Dolin, MD;  [facility];  Service: Endoscopy;  Laterality: N/A;   ESOPHAGOGASTRODUODENOSCOPY (EGD) WITH PROPOFOL N/A 01/02/2017   Rourk: Mucosal changes in the esophagus, undilated Z-line.  Small hiatal hernia.  Some snake skinning or fish scale appearance to the gastric mucosa.  Gastric biopsy with slight chronic inflammation, no H. pylori.  Esophageal biopsy with mild inflammation consistent with reflux.   EXCISION ORAL TUMOR Left 08/19/2018   Procedure: EXCISION LEFT BUCCAL MASS;  Surgeon: Leta Baptist, MD;  Location: [facility];  Service: ENT;  Laterality: Left;   IR ANGIOGRAM VISCERAL SELECTIVE  06/26/2016   IR ANGIOGRAM VISCERAL SELECTIVE  06/26/2016   IR ANGIOGRAM VISCERAL SELECTIVE  06/26/2016   IR US GUIDE VASC ACCESS RIGHT  06/26/2016   NOSE SURGERY     POLYPECTOMY  06/24/2016   Procedure: POLYPECTOMY;  Surgeon: Danie Binder, MD;  [facility];  Service: Endoscopy;;  cecal   THYROIDECTOMY     TONSILLECTOMY      Current Outpatient Medications  Medication Sig Dispense Refill   Cholecalciferol (VITAMIN D3) 125 MCG (5000 UT) CAPS Take 5,000 Units by mouth in the morning.      ferrous sulfate 325 (65 FE) MG tablet Take 1 tablet (325 mg total) by mouth 2 (two) times daily with a meal. (Patient taking differently: Take 325 mg by mouth daily with breakfast. ) 60 tablet 2    levothyroxine (SYNTHROID) 100 MCG tablet Take 100 mcg by mouth in the morning.     Multiple Vitamin (MULTIVITAMIN WITH MINERALS) TABS tablet Take 1 tablet by mouth daily.     omeprazole (PRILOSEC) 20 MG capsule TAKE 1 CAPSULE EVERY DAY (Patient taking differently: Take 20 mg by mouth in the morning. ) 90 capsule 0   pravastatin (PRAVACHOL) 20 MG tablet Take 20 mg by mouth in the morning.      No current facility-administered medications for this visit.    Allergies as of 09/01/2019   (No Known Allergies)    Family History  Problem Relation Age of Onset   Hypertension Mother    Heart disease Mother        before age 45   Heart disease Father        before age 76   Hyperlipidemia Sister    Cancer Sister    Colon cancer Neg Hx    Liver disease Neg Hx     Social History   Socioeconomic History   Marital status: Single    Spouse name: Not on file   Number of children: Not on file   Years of education: Not on file  Highest education level: Not on file  Occupational History   Not on file  Tobacco Use   Smoking status: Former Smoker    Years: 25.00    Types: Cigars    Quit date: 11/27/2006    Years since quitting: 12.7   Smokeless tobacco: Never Used   Tobacco comment: Quit in 2009  Substance and Sexual Activity   Alcohol use: Yes    Alcohol/week: 2.0 standard drinks    Types: 2 Cans of beer per week    Comment: occasioanal    Drug use: Yes    Frequency: 5.0 times per week    Types: Marijuana   Sexual activity: Not on file  Other Topics Concern   Not on file  Social History Narrative   1 deceased child. 1 living   Social Determinants of Health   Financial Resource Strain:    Difficulty of Paying Living Expenses:   Food Insecurity:    Worried About Charity fundraiser in the Last Year:    Arboriculturist in the Last Year:   Transportation Needs:    Film/video editor (Medical):    Lack of Transportation (Non-Medical):   Physical  Activity:    Days of Exercise per Week:    Minutes of Exercise per Session:   Stress:    Feeling of Stress :   Social Connections:    Frequency of Communication with Friends and Family:    Frequency of Social Gatherings with Friends and Family:    Attends Religious Services:    Active Member of Clubs or Organizations:    Attends Archivist Meetings:    Marital Status:     Subjective: Review of Systems  Constitutional: Positive for weight loss. Negative for chills, fever and malaise/fatigue.  HENT: Negative for congestion and sore throat.   Respiratory: Negative for cough and shortness of breath.   Cardiovascular: Negative for chest pain and palpitations.  Gastrointestinal: Negative for abdominal pain, blood in stool, diarrhea, melena, nausea and vomiting.  Musculoskeletal: Negative for joint pain and myalgias.  Skin: Negative for rash.  Neurological: Negative for dizziness and weakness.  Endo/Heme/Allergies: Does not bruise/bleed easily.  Psychiatric/Behavioral: Negative for depression. The patient is not nervous/anxious.   All other systems reviewed and are negative.    Objective: BP 137/83    Pulse 85    Temp (!) 97 F (36.1 C) (Oral)    Ht _0  (1.88 m)    Wt 167 lb 9.6 oz (76 kg)    BMI 21.52 kg/m  Physical Exam Vitals and nursing note reviewed.  Constitutional:      General: He is not in acute distress.    Appearance: Normal appearance. He is normal weight. He is not ill-appearing, toxic-appearing or diaphoretic.  HENT:     Head: Normocephalic and atraumatic.     Nose: No congestion or rhinorrhea.  Eyes:     General: No scleral icterus. Cardiovascular:     Rate and Rhythm: Normal rate and regular rhythm.     Heart sounds: Normal heart sounds.  Pulmonary:     Effort: Pulmonary effort is normal.     Breath sounds: Normal breath sounds.  Abdominal:     General: Bowel sounds are normal. There is no distension.     Palpations: Abdomen is soft.  There is no hepatomegaly, splenomegaly or mass.     Tenderness: There is no abdominal tenderness. There is no guarding or rebound.     Hernia: No  hernia is present.  Musculoskeletal:     Cervical back: Neck supple.  Skin:    General: Skin is warm and dry.     Coloration: Skin is not jaundiced.     Findings: No bruising or rash.  Neurological:     General: No focal deficit present.     Mental Status: He is alert and oriented to person, place, and time. Mental status is at baseline.  Psychiatric:        Mood and Affect: Mood normal.        Behavior: Behavior normal.        Thought Content: Thought content normal.       09/01/2019 8:29 AM   Disclaimer: This note was dictated with voice recognition software. Similar sounding words can inadvertently be transcribed and may not be corrected upon review.

## 2019-09-01 NOTE — Telephone Encounter (Signed)
PA for CT abd/pelvis w/contrast submitted via [employer] website. Per website, to be contacted with auth#. Tracking# [id number].

## 2019-09-01 NOTE — Assessment & Plan Note (Signed)
Noted progressive weight loss without overt GI symptoms.  He is drinking Ensure once a day.  I recommend he increase this to twice a day.  Not he has been seen by cardiology we will proceed with an upper endoscopy to further evaluate.  I will also check a CT of the abdomen and pelvis.  Most recent creatinine normal.  I will also check CBC and CMP.  Follow-up in 3 months.  Proceed with EGD on propofol/MAC with Dr. Gala Romney in near future: the risks, benefits, and alternatives have been discussed with the patient in detail. The patient states understanding and desires to proceed.  Patient is currently on oral iron. The patient is not on any other anticoagulants, anxiolytics, chronic pain medications, antidepressants, antidiabetics.  He admits smoking marijuana about 5 times a week.  We will plan for the procedure on propofol/MAC to promote adequate sedation.

## 2019-09-07 ENCOUNTER — Telehealth: Payer: Self-pay | Admitting: Internal Medicine

## 2019-09-07 ENCOUNTER — Other Ambulatory Visit: Payer: Self-pay

## 2019-09-07 DIAGNOSIS — K2270 Barrett's esophagus without dysplasia: Secondary | ICD-10-CM | POA: Diagnosis not present

## 2019-09-07 DIAGNOSIS — R634 Abnormal weight loss: Secondary | ICD-10-CM | POA: Diagnosis not present

## 2019-09-07 NOTE — Telephone Encounter (Signed)
Called Central Scheduling, no sooner appts available for CT abd/pelvis.  Called pt, EGD w/Propofol w/RMR scheduled for 09/14/19 at 3:00pm. Procedure instructions given on phone. Orders entered.

## 2019-09-07 NOTE — Telephone Encounter (Signed)
PATIENT WENT TO THE HOSPITAL TRYING TO GET THEM TO CALL AND HAVE HIS CT MOVED TO A SOONER APPOINTMENT . THEY TOLD HIM THAT THE ONLY WAY THEY COULD MOVE IT UP WOULD BE TO MAKE IT A STAT TEST   DID NOT KNOW IF THAT IS SOMETHING THAT CAN BE DONE

## 2019-09-07 NOTE — Telephone Encounter (Signed)
PA for EGD submitted via [employer] website. Case approved. [employer]# [id number], valid 09/14/19-10/14/19.

## 2019-09-07 NOTE — Telephone Encounter (Signed)
Called and informed pt of pre-op/covid test 09/10/19 at 8:30am. Letter mailed with procedure instructions.

## 2019-09-08 LAB — CBC WITH DIFFERENTIAL/PLATELET
Absolute Monocytes: 1213 cells/uL — ABNORMAL HIGH (ref 200–950)
Basophils Absolute: 9 cells/uL (ref 0–200)
Basophils Relative: 0.1 %
Eosinophils Absolute: 17 cells/uL (ref 15–500)
Eosinophils Relative: 0.2 %
HCT: 44.7 % (ref 38.5–50.0)
Hemoglobin: 14.7 g/dL (ref 13.2–17.1)
Lymphs Abs: 2391 cells/uL (ref 850–3900)
MCH: 30.2 pg (ref 27.0–33.0)
MCHC: 32.9 g/dL (ref 32.0–36.0)
MCV: 91.8 fL (ref 80.0–100.0)
MPV: 10.3 fL (ref 7.5–12.5)
Monocytes Relative: 14.1 %
Neutro Abs: 4971 cells/uL (ref 1500–7800)
Neutrophils Relative %: 57.8 %
Platelets: 243 10*3/uL (ref 140–400)
RBC: 4.87 10*6/uL (ref 4.20–5.80)
RDW: 14.2 % (ref 11.0–15.0)
Total Lymphocyte: 27.8 %
WBC: 8.6 10*3/uL (ref 3.8–10.8)

## 2019-09-08 LAB — COMPREHENSIVE METABOLIC PANEL
AG Ratio: 1.6 (calc) (ref 1.0–2.5)
ALT: 10 U/L (ref 9–46)
AST: 15 U/L (ref 10–35)
Albumin: 4.3 g/dL (ref 3.6–5.1)
Alkaline phosphatase (APISO): 70 U/L (ref 35–144)
BUN: 20 mg/dL (ref 7–25)
CO2: 30 mmol/L (ref 20–32)
Calcium: 9.7 mg/dL (ref 8.6–10.3)
Chloride: 100 mmol/L (ref 98–110)
Creat: 0.92 mg/dL (ref 0.70–1.18)
Globulin: 2.7 g/dL (calc) (ref 1.9–3.7)
Glucose, Bld: 103 mg/dL (ref 65–139)
Potassium: 4.3 mmol/L (ref 3.5–5.3)
Sodium: 138 mmol/L (ref 135–146)
Total Bilirubin: 0.4 mg/dL (ref 0.2–1.2)
Total Protein: 7 g/dL (ref 6.1–8.1)

## 2019-09-08 NOTE — Patient Instructions (Signed)
Sean Leonard  09/08/2019     @PREFPERIOPPHARMACY @   Your procedure is scheduled on  09/14/2019.  Report to [facility] at  1330 (1:30)  P.M.  Call this number if you have problems the morning of surgery:  [phone]   Remember:  Follow the diet instructions given to you by Dr Roseanne Kaufman office.                      Take these medicines the morning of surgery with A SIP OF WATER  Levothyroxine, prilosec.    Do not wear jewelry, make-up or nail polish.  Do not wear lotions, powders, or perfumes. Please wear deodorant and brush your teeth.  Do not shave 48 hours prior to surgery.  Men may shave face and neck.  Do not bring valuables to the hospital.  [facility] is not responsible for any belongings or valuables.  Contacts, dentures or bridgework may not be worn into surgery.  Leave your suitcase in the car.  After surgery it may be brought to your room.  For patients admitted to the hospital, discharge time will be determined by your treatment team.  Patients discharged the day of surgery will not be allowed to drive home.   Name and phone number of your driver:   family Special instructions:  DO NOT smoke the day of your procedure.  Please read over the following fact sheets that you were given. Anesthesia Post-op Instructions and Care and Recovery After Surgery       Upper Endoscopy, Adult, Care After This sheet gives you information about how to care for yourself after your procedure. Your health care provider may also give you more specific instructions. If you have problems or questions, contact your health care provider. What can I expect after the procedure? After the procedure, it is common to have:  A sore throat.  Mild stomach pain or discomfort.  Bloating.  Nausea. Follow these instructions at home:   Follow instructions from your health care provider about what to eat or drink after your procedure.  Return to your normal activities as told  by your health care provider. Ask your health care provider what activities are safe for you.  Take over-the-counter and prescription medicines only as told by your health care provider.  Do not drive for 24 hours if you were given a sedative during your procedure.  Keep all follow-up visits as told by your health care provider. This is important. Contact a health care provider if you have:  A sore throat that lasts longer than one day.  Trouble swallowing. Get help right away if:  You vomit blood or your vomit looks like coffee grounds.  You have: ? A fever. ? Bloody, black, or tarry stools. ? A severe sore throat or you cannot swallow. ? Difficulty breathing. ? Severe pain in your chest or abdomen. Summary  After the procedure, it is common to have a sore throat, mild stomach discomfort, bloating, and nausea.  Do not drive for 24 hours if you were given a sedative during the procedure.  Follow instructions from your health care provider about what to eat or drink after your procedure.  Return to your normal activities as told by your health care provider. This information is not intended to replace advice given to you by your health care provider. Make sure you discuss any questions you have with your health care provider. Document Revised: 09/02/2017 Document  Reviewed: 08/11/2017 Elsevier Patient Education  [place] After These instructions provide you with information about caring for yourself after your procedure. Your health care provider may also give you more specific instructions. Your treatment has been planned according to current medical practices, but problems sometimes occur. Call your health care provider if you have any problems or questions after your procedure. What can I expect after the procedure? After your procedure, you may:  Feel sleepy for several hours.  Feel clumsy and have poor balance for several  hours.  Feel forgetful about what happened after the procedure.  Have poor judgment for several hours.  Feel nauseous or vomit.  Have a sore throat if you had a breathing tube during the procedure. Follow these instructions at home: For at least 24 hours after the procedure:      Have a responsible adult stay with you. It is important to have someone help care for you until you are awake and alert.  Rest as needed.  Do not: ? Participate in activities in which you could fall or become injured. ? Drive. ? Use heavy machinery. ? Drink alcohol. ? Take sleeping pills or medicines that cause drowsiness. ? Make important decisions or sign legal documents. ? Take care of children on your own. Eating and drinking  Follow the diet that is recommended by your health care provider.  If you vomit, drink water, juice, or soup when you can drink without vomiting.  Make sure you have little or no nausea before eating solid foods. General instructions  Take over-the-counter and prescription medicines only as told by your health care provider.  If you have sleep apnea, surgery and certain medicines can increase your risk for breathing problems. Follow instructions from your health care provider about wearing your sleep device: ? Anytime you are sleeping, including during daytime naps. ? While taking prescription pain medicines, sleeping medicines, or medicines that make you drowsy.  If you smoke, do not smoke without supervision.  Keep all follow-up visits as told by your health care provider. This is important. Contact a health care provider if:  You keep feeling nauseous or you keep vomiting.  You feel light-headed.  You develop a rash.  You have a fever. Get help right away if:  You have trouble breathing. Summary  For several hours after your procedure, you may feel sleepy and have poor judgment.  Have a responsible adult stay with you for at least 24 hours or until  you are awake and alert. This information is not intended to replace advice given to you by your health care provider. Make sure you discuss any questions you have with your health care provider. Document Revised: 06/09/2017 Document Reviewed: 07/02/2015 Elsevier Patient Education  [place].

## 2019-09-10 ENCOUNTER — Other Ambulatory Visit (HOSPITAL_COMMUNITY)
Admission: RE | Admit: 2019-09-10 | Discharge: 2019-09-10 | Disposition: A | Discharge status: Home / Self Care | Payer: Medicare HMO | Source: Ambulatory Visit | Attending: Internal Medicine | Admitting: Internal Medicine

## 2019-09-10 ENCOUNTER — Other Ambulatory Visit: Payer: Self-pay

## 2019-09-10 ENCOUNTER — Encounter (HOSPITAL_COMMUNITY)
Admission: RE | Admit: 2019-09-10 | Discharge: 2019-09-10 | Disposition: A | Discharge status: Home / Self Care | Payer: Medicare HMO | Source: Ambulatory Visit | Attending: Internal Medicine | Admitting: Internal Medicine

## 2019-09-10 DIAGNOSIS — Z01812 Encounter for preprocedural laboratory examination: Secondary | ICD-10-CM | POA: Insufficient documentation

## 2019-09-10 DIAGNOSIS — Z20822 Contact with and (suspected) exposure to covid-19: Secondary | ICD-10-CM | POA: Insufficient documentation

## 2019-09-10 LAB — SARS CORONAVIRUS 2 (TAT 6-24 HRS): SARS Coronavirus 2: NEGATIVE

## 2019-09-13 ENCOUNTER — Telehealth: Payer: Self-pay | Admitting: *Deleted

## 2019-09-13 NOTE — Telephone Encounter (Signed)
Sean Leonard called and said that they had a cancellation and wanted to see if pt could move up on procedure schedule. His original time was at 3:00 pm.  Pt agreed to move up to 10:30 am spot.  Pt made aware to be at [place] [facility] to register at 9:30 am.  Pt was informed that he could have clear liquids until 6:30 am but nothing after then.  Pt voiced understanding.

## 2019-09-14 ENCOUNTER — Ambulatory Visit (HOSPITAL_COMMUNITY): Payer: Medicare HMO | Admitting: Anesthesiology

## 2019-09-14 ENCOUNTER — Ambulatory Visit (HOSPITAL_COMMUNITY)
Admission: RE | Admit: 2019-09-14 | Discharge: 2019-09-14 | Disposition: A | Discharge status: Home / Self Care | Payer: Medicare HMO | Attending: Internal Medicine | Admitting: Internal Medicine

## 2019-09-14 ENCOUNTER — Encounter (HOSPITAL_COMMUNITY)
Admission: RE | Disposition: A | Discharge status: Home / Self Care | Payer: Self-pay | Source: Home / Self Care | Attending: Internal Medicine

## 2019-09-14 ENCOUNTER — Encounter (HOSPITAL_COMMUNITY): Payer: Self-pay | Admitting: Internal Medicine

## 2019-09-14 DIAGNOSIS — I739 Peripheral vascular disease, unspecified: Secondary | ICD-10-CM | POA: Diagnosis not present

## 2019-09-14 DIAGNOSIS — M1990 Unspecified osteoarthritis, unspecified site: Secondary | ICD-10-CM | POA: Insufficient documentation

## 2019-09-14 DIAGNOSIS — K3189 Other diseases of stomach and duodenum: Secondary | ICD-10-CM | POA: Insufficient documentation

## 2019-09-14 DIAGNOSIS — R634 Abnormal weight loss: Secondary | ICD-10-CM | POA: Diagnosis not present

## 2019-09-14 DIAGNOSIS — Z79890 Hormone replacement therapy (postmenopausal): Secondary | ICD-10-CM | POA: Insufficient documentation

## 2019-09-14 DIAGNOSIS — Z87891 Personal history of nicotine dependence: Secondary | ICD-10-CM | POA: Insufficient documentation

## 2019-09-14 DIAGNOSIS — K2950 Unspecified chronic gastritis without bleeding: Secondary | ICD-10-CM | POA: Diagnosis not present

## 2019-09-14 DIAGNOSIS — K219 Gastro-esophageal reflux disease without esophagitis: Secondary | ICD-10-CM | POA: Diagnosis not present

## 2019-09-14 DIAGNOSIS — K766 Portal hypertension: Secondary | ICD-10-CM | POA: Insufficient documentation

## 2019-09-14 DIAGNOSIS — R630 Anorexia: Secondary | ICD-10-CM | POA: Insufficient documentation

## 2019-09-14 DIAGNOSIS — Z79899 Other long term (current) drug therapy: Secondary | ICD-10-CM | POA: Insufficient documentation

## 2019-09-14 DIAGNOSIS — R6881 Early satiety: Secondary | ICD-10-CM | POA: Diagnosis not present

## 2019-09-14 DIAGNOSIS — E039 Hypothyroidism, unspecified: Secondary | ICD-10-CM | POA: Insufficient documentation

## 2019-09-14 HISTORY — PX: ESOPHAGOGASTRODUODENOSCOPY (EGD) WITH PROPOFOL: SHX5813

## 2019-09-14 HISTORY — PX: BIOPSY: SHX5522

## 2019-09-14 SURGERY — ESOPHAGOGASTRODUODENOSCOPY (EGD) WITH PROPOFOL
Anesthesia: General

## 2019-09-14 MED ORDER — CHLORHEXIDINE GLUCONATE CLOTH 2 % EX PADS: 6.0000 | MEDICATED_PAD | Freq: Once | CUTANEOUS | Status: DC

## 2019-09-14 MED ORDER — PHENYLEPHRINE HCL (PRESSORS) 10 MG/ML IV SOLN
INTRAVENOUS | Status: DC | PRN
  Administered 2019-09-14: 80 ug via INTRAVENOUS

## 2019-09-14 MED ORDER — LACTATED RINGERS IV SOLN
INTRAVENOUS | Status: DC | PRN
Start: 2019-09-14 — End: 2019-09-14

## 2019-09-14 MED ORDER — LIDOCAINE VISCOUS HCL 2 % MT SOLN
OROMUCOSAL | Status: AC
  Filled 2019-09-14: qty 15

## 2019-09-14 MED ORDER — LACTATED RINGERS IV SOLN: Freq: Once | INTRAVENOUS | Status: AC

## 2019-09-14 MED ORDER — PROPOFOL 10 MG/ML IV BOLUS
INTRAVENOUS | Status: AC
  Filled 2019-09-14: qty 40

## 2019-09-14 MED ORDER — GLYCOPYRROLATE 0.2 MG/ML IJ SOLN
0.2000 mg | Freq: Once | INTRAMUSCULAR | Status: AC
  Administered 2019-09-14: 0.2 mg via INTRAVENOUS
  Filled 2019-09-14: qty 1

## 2019-09-14 MED ORDER — LIDOCAINE VISCOUS HCL 2 % MT SOLN
15.0000 mL | Freq: Once | OROMUCOSAL | Status: AC
  Administered 2019-09-14: 15 mL via OROMUCOSAL

## 2019-09-14 MED ORDER — PROPOFOL 500 MG/50ML IV EMUL
INTRAVENOUS | Status: DC | PRN
  Administered 2019-09-14: 150 ug/kg/min via INTRAVENOUS
  Administered 2019-09-14: 80 mg via INTRAVENOUS

## 2019-09-14 MED ORDER — LIDOCAINE HCL (CARDIAC) PF 50 MG/5ML IV SOSY
PREFILLED_SYRINGE | INTRAVENOUS | Status: DC | PRN
  Administered 2019-09-14: 60 mg via INTRAVENOUS

## 2019-09-14 NOTE — Anesthesia Preprocedure Evaluation (Signed)
Anesthesia Evaluation  Patient identified by MRN, date of birth, ID band Patient awake    Reviewed: Allergy & Precautions, NPO status , Patient's Chart, lab work & pertinent test results  History of Anesthesia Complications Negative for: history of anesthetic complications  Airway Mallampati: II  TM Distance: >3 FB Neck ROM: Full    Dental  (+) Upper Dentures, Lower Dentures   Pulmonary former smoker,    Pulmonary exam normal breath sounds clear to auscultation       Cardiovascular Exercise Tolerance: Good + Peripheral Vascular Disease  Normal cardiovascular exam Rhythm:Regular Rate:Normal - Systolic murmurs, - Diastolic murmurs, - Friction Rub, - Carotid Bruit, - Peripheral Edema and - Systolic Click [id number] 12:19:75 [facility] ROUTINE RECORD Sinus rhythm Atrial premature complex Short PR interval No significant change since last tracing Confirmed by Isla Pence [phone]) on 06/20/2019 2:36:09 PM   Neuro/Psych  Neuromuscular disease    GI/Hepatic PUD, GERD  Medicated and Controlled,(+)     substance abuse  alcohol use and marijuana use,   Endo/Other  Hypothyroidism   Renal/GU negative Renal ROS  negative genitourinary   Musculoskeletal  (+) Arthritis ,   Abdominal   Peds  Hematology  (+) anemia ,   Anesthesia Other Findings   Reproductive/Obstetrics negative OB ROS                             Anesthesia Physical Anesthesia Plan  ASA: III  Anesthesia Plan: General   Post-op Pain Management:    Induction: Intravenous  PONV Risk Score and Plan: 1  Airway Management Planned: Nasal Cannula, Natural Airway and Simple Face Mask  Additional Equipment:   Intra-op Plan:   Post-operative Plan:   Informed Consent: I have reviewed the patients History and Physical, chart, labs and discussed the procedure including the risks, benefits and alternatives for  the proposed anesthesia with the patient or authorized representative who has indicated his/her understanding and acceptance.     Dental advisory given  Plan Discussed with: CRNA  Anesthesia Plan Comments:         Anesthesia Quick Evaluation

## 2019-09-14 NOTE — Transfer of Care (Signed)
Immediate Anesthesia Transfer of Care Note  Patient: Sean Leonard  Procedure(s) Performed: ESOPHAGOGASTRODUODENOSCOPY (EGD) WITH PROPOFOL (N/A ) BIOPSY  Patient Location: PACU  Anesthesia Type:MAC  Level of Consciousness: awake, alert , oriented and patient cooperative  Airway & Oxygen Therapy: Patient Spontanous Breathing and Patient connected to nasal cannula oxygen  Post-op Assessment: Report given to RN, Post -op Vital signs reviewed and stable and Patient moving all extremities  Post vital signs: Reviewed and stable  Last Vitals:  Vitals Value Taken Time  BP    Temp    Pulse    Resp    SpO2      Last Pain:  Vitals:   09/14/19 1123  TempSrc:   PainSc: 0-No pain         Complications: No complications documented.

## 2019-09-14 NOTE — Op Note (Signed)
[facility] Patient Name: Sean Leonard Procedure Date: 09/14/2019 11:10 AM MRN: [id number] Date of Birth: [birth year] Attending MD: Norvel Richards , MD CSN: [id number] Age: 74 Admit Type: Outpatient Procedure:                Upper GI endoscopy Indications:              Screening for Barrett's esophagus, Weight loss Providers:                Norvel Richards, MD, Rosina Lowenstein, RN, Crystal                            Page, Aram Candela Referring MD:              Medicines:                Propofol per Anesthesia Complications:            No immediate complications. Estimated Blood Loss:     Estimated blood loss was minimal. Procedure:                Pre-Anesthesia Assessment:                           - Prior to the procedure, a History and Physical                            was performed, and patient medications and                            allergies were reviewed. The patient's tolerance of                            previous anesthesia was also reviewed. The risks                            and benefits of the procedure and the sedation                            options and risks were discussed with the patient.                            All questions were answered, and informed consent                            was obtained. Prior Anticoagulants: The patient has                            taken no previous anticoagulant or antiplatelet                            agents. ASA Grade Assessment: II - A patient with                            mild systemic disease. After reviewing the risks  and benefits, the patient was deemed in                            satisfactory condition to undergo the procedure.                           After obtaining informed consent, the endoscope was                            passed under direct vision. Throughout the                            procedure, the patient's blood pressure, pulse, and                             oxygen saturations were monitored continuously. The                            [id number]-[id number] ([id number]) scope was introduced through the                            mouth, and advanced to the second part of duodenum.                            The upper GI endoscopy was accomplished without                            difficulty. The patient tolerated the procedure                            well. Scope In: 11:28:28 AM Scope Out: 11:32:01 AM Total Procedure Duration: 0 hours 3 minutes 33 seconds  Findings:      The examined esophagus was normal. No esophageal varices. Undulating       Z-line. Did not see Barrett's epithelium or anything else warranting       biopsy      Snake skinning or fish scale appearance gastric mucosa involving the       body. No ulcer or infiltrating process seen. Abnormal gastric mucosa was       biopsied with a cold forceps for histology. Estimated blood loss was       minimal.      The duodenal bulb and second portion of the duodenum were normal. Small       hiatal hernia. Impression:               - Normal esophagus.                           -Abnormal gastric mucosa of uncertain significance.                            Biopsied. Small hiatal hernia.                           - Normal duodenal bulb and second portion of the  duodenum. Moderate Sedation:      Moderate (conscious) sedation was personally administered by an       anesthesia professional. The following parameters were monitored: oxygen       saturation, heart rate, blood pressure, respiratory rate, EKG, adequacy       of pulmonary ventilation, and response to care. Recommendation:           - Patient has a contact number available for                            emergencies. The signs and symptoms of potential                            delayed complications were discussed with the                            patient. Return to normal activities tomorrow.                             Written discharge instructions were provided to the                            patient.                           - Resume previous diet.                           - Continue present medications.                           - Await pathology results. Keep appointment for                            abdominal CT scheduled for later this month. Procedure Code(s):        --- Professional ---                           [phone], Esophagogastroduodenoscopy, flexible,                            transoral; with biopsy, single or multiple Diagnosis Code(s):        --- Professional ---                           K76.6, Portal hypertension                           K31.89, Other diseases of stomach and duodenum                           Z13.810, Encounter for screening for upper                            gastrointestinal disorder  R63.4, Abnormal weight loss CPT copyright 2019 American Medical Association. All rights reserved. The codes documented in this report are preliminary and upon coder review may  be revised to meet current compliance requirements. [name]. [name], MD Norvel Richards, MD 09/14/2019 11:47:07 AM This report has been signed electronically. Number of Addenda: 0

## 2019-09-14 NOTE — Anesthesia Postprocedure Evaluation (Signed)
Anesthesia Post Note  Patient: TALMAGE TEASTER  Procedure(s) Performed: ESOPHAGOGASTRODUODENOSCOPY (EGD) WITH PROPOFOL (N/A ) BIOPSY  Patient location during evaluation: PACU Anesthesia Type: General Level of consciousness: awake, oriented, awake and alert and patient cooperative Pain management: pain level controlled Vital Signs Assessment: post-procedure vital signs reviewed and stable Respiratory status: spontaneous breathing, respiratory function stable, nonlabored ventilation and patient connected to nasal cannula oxygen Cardiovascular status: stable and blood pressure returned to baseline Postop Assessment: no headache and no backache Anesthetic complications: no   No complications documented.   Last Vitals:  Vitals:   09/14/19 1001  BP: 130/90  Pulse: 72  Resp: 15  Temp: 36.6 C  SpO2: 95%    Last Pain:  Vitals:   09/14/19 1123  TempSrc:   PainSc: 0-No pain                 Tacy Learn

## 2019-09-14 NOTE — Discharge Instructions (Signed)
EGD Discharge instructions Please read the instructions outlined below and refer to this sheet in the next few weeks. These discharge instructions provide you with general information on caring for yourself after you leave the hospital. Your doctor may also give you specific instructions. While your treatment has been planned according to the most current medical practices available, unavoidable complications occasionally occur. If you have any problems or questions after discharge, please call your doctor. ACTIVITY  You may resume your regular activity but move at a slower pace for the next 24 hours.   Take frequent rest periods for the next 24 hours.   Walking will help expel (get rid of) the air and reduce the bloated feeling in your abdomen.   No driving for 24 hours (because of the anesthesia (medicine) used during the test).   You may shower.   Do not sign any important legal documents or operate any machinery for 24 hours (because of the anesthesia used during the test).  NUTRITION  Drink plenty of fluids.   You may resume your normal diet.   Begin with a light meal and progress to your normal diet.   Avoid alcoholic beverages for 24 hours or as instructed by your caregiver.  MEDICATIONS  You may resume your normal medications unless your caregiver tells you otherwise.  WHAT YOU CAN EXPECT TODAY  You may experience abdominal discomfort such as a feeling of fullness or "gas" pains.  FOLLOW-UP  Your doctor will discuss the results of your test with you.  SEEK IMMEDIATE MEDICAL ATTENTION IF ANY OF THE FOLLOWING OCCUR:  Excessive nausea (feeling sick to your stomach) and/or vomiting.   Severe abdominal pain and distention (swelling).   Trouble swallowing.   Temperature over 101 F (37.8 C).   Rectal bleeding or vomiting of blood.     Your stomach was biopsied.  It appeared to be inflamed.  Everything else looked good.  Keep your appointment for your CAT scan of  your abdomen later this month  Further recommendations to follow pending review of pathology report  Office visit with [place] in 4 weeks  At patient request, I called Carver Murakami at [phone] -left message on machine

## 2019-09-14 NOTE — Interval H&P Note (Signed)
History and Physical Interval Note:  09/14/2019 11:16 AM  Sean Leonard  has presented today for surgery, with the diagnosis of weight loss, history of Barrett's esophagus.  The various methods of treatment have been discussed with the patient and family. After consideration of risks, benefits and other options for treatment, the patient has consented to  Procedure(s) with comments: ESOPHAGOGASTRODUODENOSCOPY (EGD) WITH PROPOFOL (N/A) - 3:00pm as a surgical intervention.  The patient's history has been reviewed, patient examined, no change in status, stable for surgery.  I have reviewed the patient's chart and labs.  Questions were answered to the patient's satisfaction.     Sean Leonard  No change.  Patient denies dysphagia.  Diagnostic EGD per plan. The risks, benefits, limitations, alternatives and imponderables have been reviewed with the patient. Potential for esophageal dilation, biopsy, etc. have also been reviewed.  Questions have been answered. All parties agreeable.

## 2019-09-15 ENCOUNTER — Other Ambulatory Visit: Payer: Self-pay

## 2019-09-15 LAB — SURGICAL PATHOLOGY

## 2019-09-17 ENCOUNTER — Encounter (HOSPITAL_COMMUNITY): Payer: Self-pay | Admitting: Internal Medicine

## 2019-09-20 ENCOUNTER — Other Ambulatory Visit: Payer: Self-pay

## 2019-09-20 ENCOUNTER — Ambulatory Visit (HOSPITAL_COMMUNITY)
Admission: RE | Admit: 2019-09-20 | Discharge: 2019-09-20 | Disposition: A | Discharge status: Home / Self Care | Payer: Medicare HMO | Source: Ambulatory Visit | Attending: Nurse Practitioner | Admitting: Nurse Practitioner

## 2019-09-20 DIAGNOSIS — K2270 Barrett's esophagus without dysplasia: Secondary | ICD-10-CM | POA: Diagnosis not present

## 2019-09-20 DIAGNOSIS — R634 Abnormal weight loss: Secondary | ICD-10-CM | POA: Insufficient documentation

## 2019-09-20 MED ORDER — IOHEXOL 300 MG/ML  SOLN
100.0000 mL | Freq: Once | INTRAMUSCULAR | Status: AC | PRN
  Administered 2019-09-20: 100 mL via INTRAVENOUS

## 2019-09-22 DIAGNOSIS — E7849 Other hyperlipidemia: Secondary | ICD-10-CM | POA: Diagnosis not present

## 2019-09-22 DIAGNOSIS — I10 Essential (primary) hypertension: Secondary | ICD-10-CM | POA: Diagnosis not present

## 2019-09-22 DIAGNOSIS — E039 Hypothyroidism, unspecified: Secondary | ICD-10-CM | POA: Diagnosis not present

## 2019-09-29 ENCOUNTER — Telehealth: Payer: Self-pay | Admitting: Internal Medicine

## 2019-09-29 NOTE — Telephone Encounter (Signed)
Noted  

## 2019-09-29 NOTE — Telephone Encounter (Signed)
Pt is inquiring about CT results.  

## 2019-09-29 NOTE — Telephone Encounter (Signed)
See result note in Epic.

## 2019-09-29 NOTE — Telephone Encounter (Signed)
[phone] patient called asking if his CT results were available

## 2019-10-05 DIAGNOSIS — I10 Essential (primary) hypertension: Secondary | ICD-10-CM | POA: Diagnosis not present

## 2019-10-05 DIAGNOSIS — E039 Hypothyroidism, unspecified: Secondary | ICD-10-CM | POA: Diagnosis not present

## 2019-10-05 DIAGNOSIS — R634 Abnormal weight loss: Secondary | ICD-10-CM | POA: Diagnosis not present

## 2019-10-22 DIAGNOSIS — E039 Hypothyroidism, unspecified: Secondary | ICD-10-CM | POA: Diagnosis not present

## 2019-10-22 DIAGNOSIS — I10 Essential (primary) hypertension: Secondary | ICD-10-CM | POA: Diagnosis not present

## 2019-10-22 DIAGNOSIS — E7849 Other hyperlipidemia: Secondary | ICD-10-CM | POA: Diagnosis not present

## 2019-11-09 NOTE — Progress Notes (Signed)
Referring Provider: Lavella Lemons, PA Primary Care Physician:  Lavella Lemons, PA Primary GI Physician: Dr. Gala Romney  Chief Complaint  Patient presents with  . Weight Loss    reports 11lb weight loss since last OV 09/01/19. Reports his primary weight was about 181lbs. Tries to do 1 ensure per day    HPI:   Sean Leonard is a 74 y.o. male presenting today for follow-up of weight loss, early satiety, anorexia, and history of Barrett's esophagus s/p EGD. Also with history of colonic tubular adenomas and lower GI bleed in 2018 with findings ulcer in the ascending and transverse colon and rectum s/p clipping to site and ascending colon (ulcer thought to be secondary to prior polypectomy).  Due for repeat colonoscopy between 2023-2028.   At his last office visit in June 2021, he continued with poor appetite and weight loss of 7 pounds in 1.5 months.  Reported when taking mirtazapine, he had a good appetite but he was told to stop this.  He was drinking Ensure and protein shakes daily.  No other significant GI symptoms. Prior CTA 09/2018: celiac, SMA, IMA patent. Normal liver, pancreas. Diverticulosis.   Plans for EGD, CT A/P, labs, increase protein shakes to twice daily.  Labs 09/07/2019: CMP within normal limits, CBC essentially normal CT A/P with contrast 09/20/2019: No acute findings to explain weight loss. EGD 09/22/2019: Normal esophagus, no evidence of Barrett's epithelium, abnormal gastric mucosa of uncertain significance s/p biopsy, small hiatal hernia, normal examined duodenum.  Pathology revealed mild chronic gastritis, negative for H. Pylori.  Today: 9 lb weight loss since last visit. Doesn't have a great appetite. Eats breakfast: sausage, egg and jelly. Small snack for lunch. Dinner: Meat and vegetables. Small portions. Drinking 1 ensure daily when he can, but they are expensive. Admits to early satiety. No nausea or vomiting. No abdominal pain. No GERD symptoms. No dysphagia. BMs daily.  No diarrhea or constipation. No blood in the stool. Some dark stools on iron.    Started Mirtazapine a couple months ago. This helps somewhat.   No fever. No cold or flu like symptoms. No CP or heart palpitations, SOB, or cough. Left leg pain at night.   Max weight in the last couple of years 194 lbs on 04/29/2018 08/19/2018: 183 LBS 06/19/2019: 180 LBS 07/06/2019: 174 LBS 09/01/2019: 167 LBS 11/10/2019: 158 LBS    Past Medical History:  Diagnosis Date  . DDD (degenerative disc disease), lumbar   . Diverticula of colon   . GERD (gastroesophageal reflux disease)   . Hypothyroidism   . Iliac artery aneurysm, bilateral ([place])   . PAD (peripheral artery disease) ([place])   . Peptic ulcer   . Prostatitis     Past Surgical History:  Procedure Laterality Date  . BIOPSY  01/02/2017   Procedure: BIOPSY;  Surgeon: Daneil Dolin, MD;  [facility];  Service: Endoscopy;;  GASTRIC AND ESOPHAGEAL BIOPSIES  . BIOPSY  09/14/2019   Procedure: BIOPSY;  Surgeon: Daneil Dolin, MD;  [facility];  Service: Endoscopy;;  . COLONOSCOPY  April 2005   Dr. Irving Shows. Small internal hemorrhoids. Multiple small scattered diverticula in the cecum and descending colon.  . COLONOSCOPY N/A 01/18/2014   Procedure: COLONOSCOPY;  Surgeon: Daneil Dolin, MD;  [facility];  Service: Endoscopy;  Laterality: N/A;  1100  . COLONOSCOPY N/A 06/23/2016   Procedure: COLONOSCOPY;  Surgeon: Rogene Houston, MD;  [facility];  Service: Endoscopy;  Laterality: N/A;  . COLONOSCOPY WITH ESOPHAGOGASTRODUODENOSCOPY (EGD) N/A 12/16/2012   Dr. Gala Romney: rectal and colonic polyps with inadequate preparation, tubular adenoma. EGD with biopsy proven short segment Barrett's esophagus, hiatal hernia, mild chronic inactive gastritis   . COLONOSCOPY WITH PROPOFOL N/A 06/24/2016   Procedure: COLONOSCOPY WITH PROPOFOL;  Surgeon: Danie Binder, MD;  [facility];  Service: Endoscopy;   Laterality: N/A;  . COLONOSCOPY WITH PROPOFOL N/A 06/28/2016   Procedure: COLONOSCOPY WITH PROPOFOL;  Surgeon: Mauri Pole, MD;  Location: [facility];  Service: Endoscopy;  Laterality: N/A;  . ESOPHAGOGASTRODUODENOSCOPY  July 2005   Dr. Irving Shows grade 2 esophagitis at the GE junction. Acute gastritis. Multiple acute active, deep, irregular, friable ulcers in the antrum and body of the stomach. 2 active, deep, friable, irregular ulcers in the duodenum. Pathology not available.  . ESOPHAGOGASTRODUODENOSCOPY N/A 01/18/2014   Procedure: ESOPHAGOGASTRODUODENOSCOPY (EGD);  Surgeon: Daneil Dolin, MD;  [facility];  Service: Endoscopy;  Laterality: N/A;  . ESOPHAGOGASTRODUODENOSCOPY (EGD) WITH PROPOFOL N/A 01/02/2017   Rourk: Mucosal changes in the esophagus, undilated Z-line.  Small hiatal hernia.  Some snake skinning or fish scale appearance to the gastric mucosa.  Gastric biopsy with slight chronic inflammation, no H. pylori.  Esophageal biopsy with mild inflammation consistent with reflux.  . ESOPHAGOGASTRODUODENOSCOPY (EGD) WITH PROPOFOL N/A 09/14/2019   Procedure: ESOPHAGOGASTRODUODENOSCOPY (EGD) WITH PROPOFOL;  Surgeon: Daneil Dolin, MD;  Normal esophagus, no evidence of Barrett's epithelium, abnormal gastric mucosa of uncertain significance s/p biopsy, small hiatal hernia, normal examined duodenum.  Pathology revealed mild chronic gastritis, negative for H. Pylori.  Marland Kitchen EXCISION ORAL TUMOR Left 08/19/2018   Procedure: EXCISION LEFT BUCCAL MASS;  Surgeon: Leta Baptist, MD;  Location: [facility];  Service: ENT;  Laterality: Left;  . IR ANGIOGRAM VISCERAL SELECTIVE  06/26/2016  . IR ANGIOGRAM VISCERAL SELECTIVE  06/26/2016  . IR ANGIOGRAM VISCERAL SELECTIVE  06/26/2016  . IR US GUIDE VASC ACCESS RIGHT  06/26/2016  . NOSE SURGERY    . POLYPECTOMY  06/24/2016   Procedure: POLYPECTOMY;  Surgeon: Danie Binder, MD;  [facility];  Service: Endoscopy;;  cecal  .  THYROIDECTOMY    . TONSILLECTOMY      Current Outpatient Medications  Medication Sig Dispense Refill  . Cholecalciferol (VITAMIN D3) 125 MCG (5000 UT) CAPS Take 5,000 Units by mouth daily.     . ferrous sulfate 325 (65 FE) MG tablet Take 1 tablet (325 mg total) by mouth 2 (two) times daily with a meal. 60 tablet 2  . levothyroxine (SYNTHROID) 100 MCG tablet Take 100 mcg by mouth daily before breakfast.     . mirtazapine (REMERON) 15 MG tablet Take 15 mg by mouth at bedtime.    . Multiple Vitamin (MULTIVITAMIN WITH MINERALS) TABS tablet Take 1 tablet by mouth daily.    Marland Kitchen omeprazole (PRILOSEC) 20 MG capsule TAKE 1 CAPSULE EVERY DAY (Patient taking differently: Take 20 mg by mouth daily before breakfast. ) 90 capsule 0  . pravastatin (PRAVACHOL) 20 MG tablet Take 20 mg by mouth daily.     . sildenafil (VIAGRA) 100 MG tablet Take 100 mg by mouth daily as needed for erectile dysfunction.      No current facility-administered medications for this visit.    Allergies as of 11/10/2019  . (No Known Allergies)    Family History  Problem Relation Age of Onset  . Hypertension Mother   . Heart  disease Mother        before age 80  . Heart disease Father        before age 53  . Hyperlipidemia Sister   . Cancer Sister   . Colon cancer Neg Hx   . Liver disease Neg Hx     Social History   Socioeconomic History  . Marital status: Single    Spouse name: Not on file  . Number of children: Not on file  . Years of education: Not on file  . Highest education level: Not on file  Occupational History  . Not on file  Tobacco Use  . Smoking status: Former Smoker    Years: 25.00    Types: Cigars    Quit date: 11/27/2006    Years since quitting: 12.9  . Smokeless tobacco: Never Used  . Tobacco comment: Quit in 2009  Vaping Use  . Vaping Use: Never used  Substance and Sexual Activity  . Alcohol use: Yes    Alcohol/week: 2.0 standard drinks    Types: 2 Cans of beer per week    Comment:  occasioanal   . Drug use: Yes    Frequency: 5.0 times per week    Types: Marijuana    Comment: Few times a week.   Marland Kitchen Sexual activity: Not on file  Other Topics Concern  . Not on file  Social History Narrative   1 deceased child. 1 living   Social Determinants of Health   Financial Resource Strain:   . Difficulty of Paying Living Expenses:   Food Insecurity:   . Worried About Charity fundraiser in the Last Year:   . Arboriculturist in the Last Year:   Transportation Needs:   . Film/video editor (Medical):   Marland Kitchen Lack of Transportation (Non-Medical):   Physical Activity:   . Days of Exercise per Week:   . Minutes of Exercise per Session:   Stress:   . Feeling of Stress :   Social Connections:   . Frequency of Communication with Friends and Family:   . Frequency of Social Gatherings with Friends and Family:   . Attends Religious Services:   . Active Member of Clubs or Organizations:   . Attends Archivist Meetings:   Marland Kitchen Marital Status:     Review of Systems: Gen: See HPI CV: See HPI Resp: See HPI GI: See HPI nies dysphagia or odynophagia. Heme: See HPI  Physical Exam: BP 129/86   Pulse 92   Temp (!) 97.1 F (36.2 C)   Ht 6\' 2"  (1.88 m)   Wt 158 lb 9.6 oz (71.9 kg)   BMI 20.36 kg/m  General:   Alert and oriented. No distress noted. Pleasant and cooperative.  Head:  Normocephalic and atraumatic. Eyes:  Conjuctiva clear without scleral icterus. Heart:  S1, S2 present without murmurs appreciated. Lungs:  Clear to auscultation bilaterally. No wheezes, rales, or rhonchi. No distress.  Abdomen:  +BS, soft, non-tender and non-distended. No rebound or guarding. No HSM or masses noted. Msk:  Symmetrical without gross deformities. Normal posture. Extremities:  Without edema. Neurologic:  Alert and  oriented x4 Psych:  Normal mood and affect.

## 2019-11-09 NOTE — H&P (View-Only) (Signed)
Referring Provider: Lavella Lemons, PA Primary Care Physician:  Lavella Lemons, PA Primary GI Physician: Dr. Gala Romney  Chief Complaint  Patient presents with  . Weight Loss    reports 11lb weight loss since last OV 09/01/19. Reports his primary weight was about 181lbs. Tries to do 1 ensure per day    HPI:   Sean Leonard is a 74 y.o. male presenting today for follow-up of weight loss, early satiety, anorexia, and history of Barrett's esophagus s/p EGD. Also with history of colonic tubular adenomas and lower GI bleed in 2018 with findings ulcer in the ascending and transverse colon and rectum s/p clipping to site and ascending colon (ulcer thought to be secondary to prior polypectomy).  Due for repeat colonoscopy between 2023-2028.   At his last office visit in June 2021, he continued with poor appetite and weight loss of 7 pounds in 1.5 months.  Reported when taking mirtazapine, he had a good appetite but he was told to stop this.  He was drinking Ensure and protein shakes daily.  No other significant GI symptoms. Prior CTA 09/2018: celiac, SMA, IMA patent. Normal liver, pancreas. Diverticulosis.   Plans for EGD, CT A/P, labs, increase protein shakes to twice daily.  Labs 09/07/2019: CMP within normal limits, CBC essentially normal CT A/P with contrast 09/20/2019: No acute findings to explain weight loss. EGD 09/22/2019: Normal esophagus, no evidence of Barrett's epithelium, abnormal gastric mucosa of uncertain significance s/p biopsy, small hiatal hernia, normal examined duodenum.  Pathology revealed mild chronic gastritis, negative for H. Pylori.  Today: 9 lb weight loss since last visit. Doesn't have a great appetite. Eats breakfast: sausage, egg and jelly. Small snack for lunch. Dinner: Meat and vegetables. Small portions. Drinking 1 ensure daily when he can, but they are expensive. Admits to early satiety. No nausea or vomiting. No abdominal pain. No GERD symptoms. No dysphagia. BMs daily.  No diarrhea or constipation. No blood in the stool. Some dark stools on iron.    Started Mirtazapine a couple months ago. This helps somewhat.   No fever. No cold or flu like symptoms. No CP or heart palpitations, SOB, or cough. Left leg pain at night.   Max weight in the last couple of years 194 lbs on 04/29/2018 08/19/2018: 183 LBS 06/19/2019: 180 LBS 07/06/2019: 174 LBS 09/01/2019: 167 LBS 11/10/2019: 158 LBS    Past Medical History:  Diagnosis Date  . DDD (degenerative disc disease), lumbar   . Diverticula of colon   . GERD (gastroesophageal reflux disease)   . Hypothyroidism   . Iliac artery aneurysm, bilateral ([place])   . PAD (peripheral artery disease) ([place])   . Peptic ulcer   . Prostatitis     Past Surgical History:  Procedure Laterality Date  . BIOPSY  01/02/2017   Procedure: BIOPSY;  Surgeon: Daneil Dolin, MD;  [facility];  Service: Endoscopy;;  GASTRIC AND ESOPHAGEAL BIOPSIES  . BIOPSY  09/14/2019   Procedure: BIOPSY;  Surgeon: Daneil Dolin, MD;  [facility];  Service: Endoscopy;;  . COLONOSCOPY  April 2005   Dr. Irving Shows. Small internal hemorrhoids. Multiple small scattered diverticula in the cecum and descending colon.  . COLONOSCOPY N/A 01/18/2014   Procedure: COLONOSCOPY;  Surgeon: Daneil Dolin, MD;  [facility];  Service: Endoscopy;  Laterality: N/A;  1100  . COLONOSCOPY N/A 06/23/2016   Procedure: COLONOSCOPY;  Surgeon: Rogene Houston, MD;  [facility];  Service: Endoscopy;  Laterality: N/A;  . COLONOSCOPY WITH ESOPHAGOGASTRODUODENOSCOPY (EGD) N/A 12/16/2012   Dr. Gala Romney: rectal and colonic polyps with inadequate preparation, tubular adenoma. EGD with biopsy proven short segment Barrett's esophagus, hiatal hernia, mild chronic inactive gastritis   . COLONOSCOPY WITH PROPOFOL N/A 06/24/2016   Procedure: COLONOSCOPY WITH PROPOFOL;  Surgeon: Danie Binder, MD;  [facility];  Service: Endoscopy;   Laterality: N/A;  . COLONOSCOPY WITH PROPOFOL N/A 06/28/2016   Procedure: COLONOSCOPY WITH PROPOFOL;  Surgeon: Mauri Pole, MD;  Location: [facility];  Service: Endoscopy;  Laterality: N/A;  . ESOPHAGOGASTRODUODENOSCOPY  July 2005   Dr. Irving Shows grade 2 esophagitis at the GE junction. Acute gastritis. Multiple acute active, deep, irregular, friable ulcers in the antrum and body of the stomach. 2 active, deep, friable, irregular ulcers in the duodenum. Pathology not available.  . ESOPHAGOGASTRODUODENOSCOPY N/A 01/18/2014   Procedure: ESOPHAGOGASTRODUODENOSCOPY (EGD);  Surgeon: Daneil Dolin, MD;  [facility];  Service: Endoscopy;  Laterality: N/A;  . ESOPHAGOGASTRODUODENOSCOPY (EGD) WITH PROPOFOL N/A 01/02/2017   Rourk: Mucosal changes in the esophagus, undilated Z-line.  Small hiatal hernia.  Some snake skinning or fish scale appearance to the gastric mucosa.  Gastric biopsy with slight chronic inflammation, no H. pylori.  Esophageal biopsy with mild inflammation consistent with reflux.  . ESOPHAGOGASTRODUODENOSCOPY (EGD) WITH PROPOFOL N/A 09/14/2019   Procedure: ESOPHAGOGASTRODUODENOSCOPY (EGD) WITH PROPOFOL;  Surgeon: Daneil Dolin, MD;  Normal esophagus, no evidence of Barrett's epithelium, abnormal gastric mucosa of uncertain significance s/p biopsy, small hiatal hernia, normal examined duodenum.  Pathology revealed mild chronic gastritis, negative for H. Pylori.  Marland Kitchen EXCISION ORAL TUMOR Left 08/19/2018   Procedure: EXCISION LEFT BUCCAL MASS;  Surgeon: Leta Baptist, MD;  Location: [facility];  Service: ENT;  Laterality: Left;  . IR ANGIOGRAM VISCERAL SELECTIVE  06/26/2016  . IR ANGIOGRAM VISCERAL SELECTIVE  06/26/2016  . IR ANGIOGRAM VISCERAL SELECTIVE  06/26/2016  . IR US GUIDE VASC ACCESS RIGHT  06/26/2016  . NOSE SURGERY    . POLYPECTOMY  06/24/2016   Procedure: POLYPECTOMY;  Surgeon: Danie Binder, MD;  [facility];  Service: Endoscopy;;  cecal  .  THYROIDECTOMY    . TONSILLECTOMY      Current Outpatient Medications  Medication Sig Dispense Refill  . Cholecalciferol (VITAMIN D3) 125 MCG (5000 UT) CAPS Take 5,000 Units by mouth daily.     . ferrous sulfate 325 (65 FE) MG tablet Take 1 tablet (325 mg total) by mouth 2 (two) times daily with a meal. 60 tablet 2  . levothyroxine (SYNTHROID) 100 MCG tablet Take 100 mcg by mouth daily before breakfast.     . mirtazapine (REMERON) 15 MG tablet Take 15 mg by mouth at bedtime.    . Multiple Vitamin (MULTIVITAMIN WITH MINERALS) TABS tablet Take 1 tablet by mouth daily.    Marland Kitchen omeprazole (PRILOSEC) 20 MG capsule TAKE 1 CAPSULE EVERY DAY (Patient taking differently: Take 20 mg by mouth daily before breakfast. ) 90 capsule 0  . pravastatin (PRAVACHOL) 20 MG tablet Take 20 mg by mouth daily.     . sildenafil (VIAGRA) 100 MG tablet Take 100 mg by mouth daily as needed for erectile dysfunction.      No current facility-administered medications for this visit.    Allergies as of 11/10/2019  . (No Known Allergies)    Family History  Problem Relation Age of Onset  . Hypertension Mother   . Heart  disease Mother        before age 45  . Heart disease Father        before age 50  . Hyperlipidemia Sister   . Cancer Sister   . Colon cancer Neg Hx   . Liver disease Neg Hx     Social History   Socioeconomic History  . Marital status: Single    Spouse name: Not on file  . Number of children: Not on file  . Years of education: Not on file  . Highest education level: Not on file  Occupational History  . Not on file  Tobacco Use  . Smoking status: Former Smoker    Years: 25.00    Types: Cigars    Quit date: 11/27/2006    Years since quitting: 12.9  . Smokeless tobacco: Never Used  . Tobacco comment: Quit in 2009  Vaping Use  . Vaping Use: Never used  Substance and Sexual Activity  . Alcohol use: Yes    Alcohol/week: 2.0 standard drinks    Types: 2 Cans of beer per week    Comment:  occasioanal   . Drug use: Yes    Frequency: 5.0 times per week    Types: Marijuana    Comment: Few times a week.   Marland Kitchen Sexual activity: Not on file  Other Topics Concern  . Not on file  Social History Narrative   1 deceased child. 1 living   Social Determinants of Health   Financial Resource Strain:   . Difficulty of Paying Living Expenses:   Food Insecurity:   . Worried About Charity fundraiser in the Last Year:   . Arboriculturist in the Last Year:   Transportation Needs:   . Film/video editor (Medical):   Marland Kitchen Lack of Transportation (Non-Medical):   Physical Activity:   . Days of Exercise per Week:   . Minutes of Exercise per Session:   Stress:   . Feeling of Stress :   Social Connections:   . Frequency of Communication with Friends and Family:   . Frequency of Social Gatherings with Friends and Family:   . Attends Religious Services:   . Active Member of Clubs or Organizations:   . Attends Archivist Meetings:   Marland Kitchen Marital Status:     Review of Systems: Gen: See HPI CV: See HPI Resp: See HPI GI: See HPI nies dysphagia or odynophagia. Heme: See HPI  Physical Exam: BP 129/86   Pulse 92   Temp (!) 97.1 F (36.2 C)   Ht 6\' 2"  (1.88 m)   Wt 158 lb 9.6 oz (71.9 kg)   BMI 20.36 kg/m  General:   Alert and oriented. No distress noted. Pleasant and cooperative.  Head:  Normocephalic and atraumatic. Eyes:  Conjuctiva clear without scleral icterus. Heart:  S1, S2 present without murmurs appreciated. Lungs:  Clear to auscultation bilaterally. No wheezes, rales, or rhonchi. No distress.  Abdomen:  +BS, soft, non-tender and non-distended. No rebound or guarding. No HSM or masses noted. Msk:  Symmetrical without gross deformities. Normal posture. Extremities:  Without edema. Neurologic:  Alert and  oriented x4 Psych:  Normal mood and affect.

## 2019-11-10 ENCOUNTER — Ambulatory Visit (INDEPENDENT_AMBULATORY_CARE_PROVIDER_SITE_OTHER): Payer: Medicare HMO | Admitting: Gastroenterology

## 2019-11-10 ENCOUNTER — Other Ambulatory Visit: Payer: Self-pay

## 2019-11-10 ENCOUNTER — Encounter: Payer: Self-pay | Admitting: Gastroenterology

## 2019-11-10 VITALS — BP 129/86 | HR 92 | Temp 97.1°F | Ht 74.0 in | Wt 158.6 lb

## 2019-11-10 DIAGNOSIS — R634 Abnormal weight loss: Secondary | ICD-10-CM

## 2019-11-10 DIAGNOSIS — R6881 Early satiety: Secondary | ICD-10-CM

## 2019-11-10 DIAGNOSIS — D509 Iron deficiency anemia, unspecified: Secondary | ICD-10-CM | POA: Diagnosis not present

## 2019-11-10 NOTE — Assessment & Plan Note (Addendum)
74 year old male with progressive weight loss, lack of appetite, and early satiety. Resumed Remeron a couple months ago which has helped slightly with his appetite. Max weight over the last couple of years was 194 lbs in February 2020.  Today, he weighs 158 lbs with 9 pound weight loss in the last 2 months.  Prior GI evaluation with CTA July 2020 without mesenteric ischemia.  CT A/P with contrast June 2021 with no acute findings to explain weight loss.  Labs in June 2021 with CBC and CMP within normal limits.  TSH within normal limits March 2021.  EGD 09/22/2019 with normal esophagus, no evidence of Barrett's epithelium, abnormal gastric mucosa s/p biopsy consistent with chronic gastritis without H. pylori, small hiatal hernia, normal examined duodenum.  Colonoscopy in 2018 with tubular adenoma, due for repeat between 2023-2028.  He has no upper or lower GI symptoms at this time.  Abdominal exam is benign.  Unclear etiology weight loss.  With early satiety, consider gastroparesis.  He has history of iron deficiency anemia in 2018 with GI bleed and has maintained on iron.  Most recent CBC within normal limits.  Considering significant weight loss, very well may need to consider updating colonoscopy.  Plan:  Gastric emptying study Update iron panel.  If iron panel is within normal limits, will likely have patient start oral iron and monitor. Obtain IFOBT Try making protein shakes rather than Ensure due to cost.  Recommended twice daily. Continue mirtazapine daily. Further recommendations to follow.

## 2019-11-10 NOTE — Assessment & Plan Note (Signed)
History of anemia in the setting of GI bleed in 2018.  He has maintained on oral iron.  No recent iron panel on file.  Last CBC in June 2021 with hemoglobin 14.7.  No overt GI bleeding.  Noted stools are dark on iron.  Plan to update iron panel.  Will likely have patient discontinue iron if iron panel is within normal limits.

## 2019-11-10 NOTE — Assessment & Plan Note (Signed)
Addressed under weight loss.  

## 2019-11-10 NOTE — Patient Instructions (Addendum)
Please have iron panel completed.   Please complete stool test to check for blood in your stool.   We will arrange a gastric emptying study for you.   You may try buying protein powder and either making a milk shake or a smoothie with the powder mixed in rather than the ensure as they are very expensive. I recommend you try to drink two shakes daily.   Continue omeprazole 20 mg daily.   Continue mirtazapine nightly.   We will call you with further recommendations once labs and gastric emptying study have been completed.  Aliene Altes, PA-C [facility]

## 2019-11-11 DIAGNOSIS — D509 Iron deficiency anemia, unspecified: Secondary | ICD-10-CM | POA: Diagnosis not present

## 2019-11-11 DIAGNOSIS — R634 Abnormal weight loss: Secondary | ICD-10-CM | POA: Diagnosis not present

## 2019-11-11 LAB — IRON,TIBC AND FERRITIN PANEL
%SAT: 15 % (calc) — ABNORMAL LOW (ref 20–48)
Ferritin: 299 ng/mL (ref 24–380)
Iron: 40 ug/dL — ABNORMAL LOW (ref 50–180)
TIBC: 269 mcg/dL (calc) (ref 250–425)

## 2019-11-12 NOTE — H&P (View-Only) (Signed)
Iron stores within normal limits. Serum iron and saturation are slightly low. He can continue on oral iron for now.   Please remind him to complete IFOBT and return it to our office.

## 2019-11-12 NOTE — Progress Notes (Signed)
Iron stores within normal limits. Serum iron and saturation are slightly low. He can continue on oral iron for now.   Please remind him to complete IFOBT and return it to our office.

## 2019-11-16 ENCOUNTER — Encounter: Payer: Self-pay | Admitting: *Deleted

## 2019-11-16 ENCOUNTER — Other Ambulatory Visit: Payer: Self-pay

## 2019-11-16 ENCOUNTER — Ambulatory Visit (INDEPENDENT_AMBULATORY_CARE_PROVIDER_SITE_OTHER): Payer: Self-pay | Admitting: Nurse Practitioner

## 2019-11-16 DIAGNOSIS — D509 Iron deficiency anemia, unspecified: Secondary | ICD-10-CM

## 2019-11-16 LAB — IFOBT (OCCULT BLOOD): IFOBT: POSITIVE

## 2019-11-16 NOTE — H&P (View-Only) (Signed)
IFOBT is positive. Considering progressive weight loss and positive IFOBT, recommend we go ahead and update colonoscopy. We discussed this possibility at his office visit.  [facility] Clinical Pool: Please arrange TCS with propofol with Dr. Gala Romney. Dx: weight loss, + IFOBT ASA III Hold iron x7 days prior to procedure.

## 2019-11-16 NOTE — Progress Notes (Signed)
IFOBT is positive. Considering progressive weight loss and positive IFOBT, recommend we go ahead and update colonoscopy. We discussed this possibility at his office visit.  [facility] Clinical Pool: Please arrange TCS with propofol with Dr. Gala Romney. Dx: weight loss, + IFOBT ASA III Hold iron x7 days prior to procedure.

## 2019-11-18 ENCOUNTER — Other Ambulatory Visit (HOSPITAL_COMMUNITY)
Admission: RE | Admit: 2019-11-18 | Discharge: 2019-11-18 | Disposition: A | Discharge status: Home / Self Care | Payer: Medicare HMO | Source: Ambulatory Visit | Attending: Internal Medicine | Admitting: Internal Medicine

## 2019-11-18 ENCOUNTER — Encounter (HOSPITAL_COMMUNITY)
Admission: RE | Admit: 2019-11-18 | Discharge: 2019-11-18 | Disposition: A | Discharge status: Home / Self Care | Payer: Medicare HMO | Source: Ambulatory Visit | Attending: Internal Medicine | Admitting: Internal Medicine

## 2019-11-18 ENCOUNTER — Other Ambulatory Visit: Payer: Self-pay

## 2019-11-18 ENCOUNTER — Encounter (HOSPITAL_COMMUNITY)
Admission: RE | Admit: 2019-11-18 | Discharge: 2019-11-18 | Disposition: A | Discharge status: Home / Self Care | Payer: Medicare HMO | Source: Ambulatory Visit | Attending: Gastroenterology | Admitting: Gastroenterology

## 2019-11-18 DIAGNOSIS — R6881 Early satiety: Secondary | ICD-10-CM | POA: Diagnosis not present

## 2019-11-18 DIAGNOSIS — R634 Abnormal weight loss: Secondary | ICD-10-CM | POA: Insufficient documentation

## 2019-11-18 DIAGNOSIS — Z01812 Encounter for preprocedural laboratory examination: Secondary | ICD-10-CM | POA: Insufficient documentation

## 2019-11-18 DIAGNOSIS — Z20822 Contact with and (suspected) exposure to covid-19: Secondary | ICD-10-CM | POA: Insufficient documentation

## 2019-11-18 LAB — SARS CORONAVIRUS 2 (TAT 6-24 HRS): SARS Coronavirus 2: NEGATIVE

## 2019-11-18 MED ORDER — TECHNETIUM TC 99M SULFUR COLLOID
2.0000 | Freq: Once | INTRAVENOUS | Status: AC | PRN
  Administered 2019-11-18: 2.12 via ORAL

## 2019-11-18 NOTE — H&P (View-Only) (Signed)
GES within normal limits. This means his stomach is emptying normally and is not contributing to his early satiety/lack of appetite.   Proceed with TCS as planned.

## 2019-11-18 NOTE — Patient Instructions (Signed)
Your procedure is scheduled on: 8/302021  Report to [facility] at   8:15  AM.  Call this number if you have problems the morning of surgery: [phone]   Remember:              Follow Directions on the letter you received from Your Physician's office regarding the Bowel Prep              No Smoking the day of Procedure :   Take these medicines the morning of surgery with A SIP OF WATER: prilosec and levothyroxine   Do not wear jewelry, make-up or nail polish.    Do not bring valuables to the hospital.  Contacts, dentures or bridgework may not be worn into surgery.  .   Patients discharged the day of surgery will not be allowed to drive home.     Colonoscopy, Adult, Care After This sheet gives you information about how to care for yourself after your procedure. Your health care provider may also give you more specific instructions. If you have problems or questions, contact your health care provider. What can I expect after the procedure? After the procedure, it is common to have:  A small amount of blood in your stool for 24 hours after the procedure.  Some gas.  Mild abdominal cramping or bloating.  Follow these instructions at home: General instructions   For the first 24 hours after the procedure: ? Do not drive or use machinery. ? Do not sign important documents. ? Do not drink alcohol. ? Do your regular daily activities at a slower pace than normal. ? Eat soft, easy-to-digest foods. ? Rest often.  Take over-the-counter or prescription medicines only as told by your health care provider.  It is up to you to get the results of your procedure. Ask your health care provider, or the department performing the procedure, when your results will be ready. Relieving cramping and bloating  Try walking around when you have cramps or feel bloated.  Apply heat to your abdomen as told by your health care provider. Use a heat source that your health care provider  recommends, such as a moist heat pack or a heating pad. ? Place a towel between your skin and the heat source. ? Leave the heat on for 20-30 minutes. ? Remove the heat if your skin turns bright red. This is especially important if you are unable to feel pain, heat, or cold. You may have a greater risk of getting burned. Eating and drinking  Drink enough fluid to keep your urine clear or pale yellow.  Resume your normal diet as instructed by your health care provider. Avoid heavy or fried foods that are hard to digest.  Avoid drinking alcohol for as long as instructed by your health care provider. Contact a health care provider if:  You have blood in your stool 2-3 days after the procedure. Get help right away if:  You have more than a small spotting of blood in your stool.  You pass large blood clots in your stool.  Your abdomen is swollen.  You have nausea or vomiting.  You have a fever.  You have increasing abdominal pain that is not relieved with medicine. This information is not intended to replace advice given to you by your health care provider. Make sure you discuss any questions you have with your health care provider. Document Released: 10/24/2003 Document Revised: 12/04/2015 Document Reviewed: 05/23/2015 Elsevier Interactive Patient Education  2018 Elsevier  Inc. 

## 2019-11-18 NOTE — Progress Notes (Signed)
GES within normal limits. This means his stomach is emptying normally and is not contributing to his early satiety/lack of appetite.   Proceed with TCS as planned.

## 2019-11-19 ENCOUNTER — Other Ambulatory Visit (HOSPITAL_COMMUNITY): Admission: RE | Admit: 2019-11-19 | Payer: Medicare HMO | Source: Ambulatory Visit

## 2019-11-22 ENCOUNTER — Encounter (HOSPITAL_COMMUNITY)
Admission: RE | Disposition: A | Discharge status: Home / Self Care | Payer: Self-pay | Source: Home / Self Care | Attending: Internal Medicine

## 2019-11-22 ENCOUNTER — Encounter (HOSPITAL_COMMUNITY): Payer: Self-pay | Admitting: Internal Medicine

## 2019-11-22 ENCOUNTER — Ambulatory Visit (HOSPITAL_COMMUNITY): Payer: Medicare HMO | Admitting: Anesthesiology

## 2019-11-22 ENCOUNTER — Ambulatory Visit (HOSPITAL_COMMUNITY)
Admission: RE | Admit: 2019-11-22 | Discharge: 2019-11-22 | Disposition: A | Discharge status: Home / Self Care | Payer: Medicare HMO | Attending: Internal Medicine | Admitting: Internal Medicine

## 2019-11-22 ENCOUNTER — Other Ambulatory Visit: Payer: Self-pay

## 2019-11-22 DIAGNOSIS — Z79899 Other long term (current) drug therapy: Secondary | ICD-10-CM | POA: Insufficient documentation

## 2019-11-22 DIAGNOSIS — Z8719 Personal history of other diseases of the digestive system: Secondary | ICD-10-CM | POA: Insufficient documentation

## 2019-11-22 DIAGNOSIS — Z86010 Personal history of colonic polyps: Secondary | ICD-10-CM | POA: Insufficient documentation

## 2019-11-22 DIAGNOSIS — F1290 Cannabis use, unspecified, uncomplicated: Secondary | ICD-10-CM | POA: Diagnosis not present

## 2019-11-22 DIAGNOSIS — K5730 Diverticulosis of large intestine without perforation or abscess without bleeding: Secondary | ICD-10-CM | POA: Diagnosis not present

## 2019-11-22 DIAGNOSIS — Z79890 Hormone replacement therapy (postmenopausal): Secondary | ICD-10-CM | POA: Insufficient documentation

## 2019-11-22 DIAGNOSIS — R6881 Early satiety: Secondary | ICD-10-CM | POA: Insufficient documentation

## 2019-11-22 DIAGNOSIS — R195 Other fecal abnormalities: Secondary | ICD-10-CM | POA: Diagnosis not present

## 2019-11-22 DIAGNOSIS — K640 First degree hemorrhoids: Secondary | ICD-10-CM | POA: Diagnosis not present

## 2019-11-22 DIAGNOSIS — E890 Postprocedural hypothyroidism: Secondary | ICD-10-CM | POA: Diagnosis not present

## 2019-11-22 DIAGNOSIS — R634 Abnormal weight loss: Secondary | ICD-10-CM | POA: Diagnosis not present

## 2019-11-22 DIAGNOSIS — I739 Peripheral vascular disease, unspecified: Secondary | ICD-10-CM | POA: Diagnosis not present

## 2019-11-22 DIAGNOSIS — Z87891 Personal history of nicotine dependence: Secondary | ICD-10-CM | POA: Insufficient documentation

## 2019-11-22 DIAGNOSIS — K219 Gastro-esophageal reflux disease without esophagitis: Secondary | ICD-10-CM | POA: Diagnosis not present

## 2019-11-22 HISTORY — PX: COLONOSCOPY WITH PROPOFOL: SHX5780

## 2019-11-22 SURGERY — COLONOSCOPY WITH PROPOFOL
Anesthesia: General

## 2019-11-22 MED ORDER — CHLORHEXIDINE GLUCONATE CLOTH 2 % EX PADS: 6.0000 | MEDICATED_PAD | Freq: Once | CUTANEOUS | Status: DC

## 2019-11-22 MED ORDER — LACTATED RINGERS IV SOLN: Freq: Once | INTRAVENOUS | Status: AC

## 2019-11-22 MED ORDER — PROPOFOL 10 MG/ML IV BOLUS
INTRAVENOUS | Status: DC | PRN
  Administered 2019-11-22 (×2): 30 mg via INTRAVENOUS
  Administered 2019-11-22: 20 mg via INTRAVENOUS
  Administered 2019-11-22: 30 mg via INTRAVENOUS

## 2019-11-22 MED ORDER — LACTATED RINGERS IV SOLN: INTRAVENOUS | Status: DC | PRN

## 2019-11-22 MED ORDER — PROPOFOL 500 MG/50ML IV EMUL
INTRAVENOUS | Status: DC | PRN
  Administered 2019-11-22: 150 ug/kg/min via INTRAVENOUS

## 2019-11-22 MED ORDER — STERILE WATER FOR IRRIGATION IR SOLN
Status: DC | PRN
  Administered 2019-11-22: 1.5 mL

## 2019-11-22 NOTE — Anesthesia Postprocedure Evaluation (Signed)
Anesthesia Post Note  Patient: Sean Leonard  Procedure(s) Performed: COLONOSCOPY WITH PROPOFOL (N/A )  Patient location during evaluation: PACU Anesthesia Type: General Level of consciousness: awake and alert and oriented Pain management: pain level controlled Vital Signs Assessment: post-procedure vital signs reviewed and stable Respiratory status: spontaneous breathing Cardiovascular status: blood pressure returned to baseline and stable Postop Assessment: no apparent nausea or vomiting Anesthetic complications: no   No complications documented.   Last Vitals:  Vitals:   11/22/19 0841  BP: 132/90  Pulse: 94  Resp: (!) 21  Temp: 36.8 C  SpO2: 99%    Last Pain:  Vitals:   11/22/19 0913  TempSrc:   PainSc: 0-No pain                 Liza Czerwinski

## 2019-11-22 NOTE — Interval H&P Note (Signed)
History and Physical Interval Note:  11/22/2019 9:06 AM  Sean Leonard  has presented today for surgery, with the diagnosis of WEIGHT LOSS, POSITIVE IFOT.  The various methods of treatment have been discussed with the patient and family. After consideration of risks, benefits and other options for treatment, the patient has consented to  Procedure(s) with comments: COLONOSCOPY WITH PROPOFOL (N/A) - 9:45am as a surgical intervention.  The patient's history has been reviewed, patient examined, no change in status, stable for surgery.  I have reviewed the patient's chart and labs.  Questions were answered to the patient's satisfaction.     [name]  Patient seen and examined.  No change.  Diagnostic colonoscopy today per plan.  The risks, benefits, limitations, alternatives and imponderables have been reviewed with the patient. Questions have been answered. All parties are agreeable.

## 2019-11-22 NOTE — Op Note (Signed)
[facility] Patient Name: Sean Leonard Procedure Date: 11/22/2019 8:58 AM MRN: [id number] Date of Birth: Oct 21, 1945 Attending MD: Norvel Richards , MD CSN: [id number] Age: 74 Admit Type: Outpatient Procedure:                Colonoscopy Indications:              Heme positive stool Providers:                Norvel Richards, MD, Janeece Riggers, RN, Lambert Mody, Randa Spike, Technician Referring MD:              Medicines:                Propofol per Anesthesia Complications:            No immediate complications. Estimated Blood Loss:     Estimated blood loss: none. Procedure:                Pre-Anesthesia Assessment:                           - Prior to the procedure, a History and Physical                            was performed, and patient medications and                            allergies were reviewed. The patient's tolerance of                            previous anesthesia was also reviewed. The risks                            and benefits of the procedure and the sedation                            options and risks were discussed with the patient.                            All questions were answered, and informed consent                            was obtained. Prior Anticoagulants: The patient has                            taken no previous anticoagulant or antiplatelet                            agents. ASA Grade Assessment: II - A patient with                            mild systemic disease. After reviewing the risks  and benefits, the patient was deemed in                            satisfactory condition to undergo the procedure.                           After obtaining informed consent, the colonoscope                            was passed under direct vision. Throughout the                            procedure, the patient's blood pressure, pulse, and                            oxygen  saturations were monitored continuously. The                            [id number]-[id number] ([id number]) scope was introduced through                            the anus and advanced to the the cecum, identified                            by appendiceal orifice and ileocecal valve. The                            colonoscopy was performed without difficulty. The                            patient tolerated the procedure well. The quality                            of the bowel preparation was adequate. Scope In: 9:18:01 AM Scope Out: 9:29:37 AM Scope Withdrawal Time: 0 hours 6 minutes 22 seconds  Total Procedure Duration: 0 hours 11 minutes 36 seconds  Findings:      The perianal and digital rectal examinations were normal.      Non-bleeding internal hemorrhoids were found during retroflexion. The       hemorrhoids were moderate, medium-sized and Grade I (internal       hemorrhoids that do not prolapse).      Scattered medium-mouthed diverticula were found in the entire colon.      The exam was otherwise without abnormality on direct and retroflexion       views. Impression:               - Non-bleeding internal hemorrhoids.                           - Diverticulosis in the entire examined colon.                           - The examination was otherwise normal on direct  and retroflexion views.                           - No specimens collected. Moderate Sedation:      Moderate (conscious) sedation was personally administered by an       anesthesia professional. The following parameters were monitored: oxygen       saturation, heart rate, blood pressure, respiratory rate, EKG, adequacy       of pulmonary ventilation, and response to care. Recommendation:           - Patient has a contact number available for                            emergencies. The signs and symptoms of potential                            delayed complications were discussed with the                             patient. Return to normal activities tomorrow.                            Written discharge instructions were provided to the                            patient.                           - Resume previous diet.                           - Continue present medications.                           - No repeat colonoscopy due to age.                           - Return to GI clinic in 6 weeks. Procedure Code(s):        --- Professional ---                           [phone], Colonoscopy, flexible; diagnostic, including                            collection of specimen(s) by brushing or washing,                            when performed (separate procedure) Diagnosis Code(s):        --- Professional ---                           K64.0, First degree hemorrhoids                           R19.5, Other fecal abnormalities  K57.30, Diverticulosis of large intestine without                            perforation or abscess without bleeding CPT copyright 2019 American Medical Association. All rights reserved. The codes documented in this report are preliminary and upon coder review may  be revised to meet current compliance requirements. [name]. Denzil Bristol, MD Norvel Richards, MD 11/22/2019 9:38:19 AM This report has been signed electronically. Number of Addenda: 0

## 2019-11-22 NOTE — Discharge Instructions (Signed)
  Colonoscopy Discharge Instructions  Read the instructions outlined below and refer to this sheet in the next few weeks. These discharge instructions provide you with general information on caring for yourself after you leave the hospital. Your doctor may also give you specific instructions. While your treatment has been planned according to the most current medical practices available, unavoidable complications occasionally occur. If you have any problems or questions after discharge, call Dr. Gala Romney at [phone]. ACTIVITY  You may resume your regular activity, but move at a slower pace for the next 24 hours.   Take frequent rest periods for the next 24 hours.   Walking will help get rid of the air and reduce the bloated feeling in your belly (abdomen).   No driving for 24 hours (because of the medicine (anesthesia) used during the test).    Do not sign any important legal documents or operate any machinery for 24 hours (because of the anesthesia used during the test).  NUTRITION  Drink plenty of fluids.   You may resume your normal diet as instructed by your doctor.   Begin with a light meal and progress to your normal diet. Heavy or fried foods are harder to digest and may make you feel sick to your stomach (nauseated).   Avoid alcoholic beverages for 24 hours or as instructed.  MEDICATIONS  You may resume your normal medications unless your doctor tells you otherwise.  WHAT YOU CAN EXPECT TODAY  Some feelings of bloating in the abdomen.   Passage of more gas than usual.   Spotting of blood in your stool or on the toilet paper.  IF YOU HAD POLYPS REMOVED DURING THE COLONOSCOPY:  No aspirin products for 7 days or as instructed.   No alcohol for 7 days or as instructed.   Eat a soft diet for the next 24 hours.  FINDING OUT THE RESULTS OF YOUR TEST Not all test results are available during your visit. If your test results are not back during the visit, make an appointment  with your caregiver to find out the results. Do not assume everything is normal if you have not heard from your caregiver or the medical facility. It is important for you to follow up on all of your test results.  SEEK IMMEDIATE MEDICAL ATTENTION IF:  You have more than a spotting of blood in your stool.   Your belly is swollen (abdominal distention).   You are nauseated or vomiting.   You have a temperature over 101.   You have abdominal pain or discomfort that is severe or gets worse throughout the day.    Hemorrhoid and diverticulosis information provided  I do not recommend a future colonoscopy list new symptoms develop  Office visit with [place] in 6 weeks  At patient request, I called Peggy at [phone]; reviewed results and recommendations

## 2019-11-22 NOTE — Anesthesia Preprocedure Evaluation (Addendum)
Anesthesia Evaluation  Patient identified by MRN, date of birth, ID band Patient awake    Reviewed: Allergy & Precautions, NPO status , Patient's Chart, lab work & pertinent test results  History of Anesthesia Complications Negative for: history of anesthetic complications  Airway Mallampati: II  TM Distance: >3 FB Neck ROM: Full    Dental  (+) Upper Dentures, Lower Dentures   Pulmonary Patient abstained from smoking., former smoker,    Pulmonary exam normal breath sounds clear to auscultation       Cardiovascular Exercise Tolerance: Good + Peripheral Vascular Disease  Normal cardiovascular exam Rhythm:Regular Rate:Normal - Systolic murmurs, - Diastolic murmurs, - Friction Rub, - Carotid Bruit, - Peripheral Edema and - Systolic Click [id number] 62:56:38 [facility]-ER ROUTINE RECORD Sinus rhythm Atrial premature complex Short PR interval No significant change since last tracing Confirmed by Isla Pence [phone]) on 06/20/2019 2:36:09 PM   Neuro/Psych  Neuromuscular disease    GI/Hepatic PUD, GERD  Medicated and Controlled,(+)     substance abuse  alcohol use and marijuana use,   Endo/Other  Hypothyroidism   Renal/GU negative Renal ROS  negative genitourinary   Musculoskeletal  (+) Arthritis ,   Abdominal   Peds  Hematology  (+) anemia ,   Anesthesia Other Findings   Reproductive/Obstetrics negative OB ROS                             Anesthesia Physical  Anesthesia Plan  ASA: III  Anesthesia Plan: General   Post-op Pain Management:    Induction: Intravenous  PONV Risk Score and Plan: 1  Airway Management Planned: Nasal Cannula, Natural Airway and Simple Face Mask  Additional Equipment:   Intra-op Plan:   Post-operative Plan:   Informed Consent: I have reviewed the patients History and Physical, chart, labs and discussed the procedure including the  risks, benefits and alternatives for the proposed anesthesia with the patient or authorized representative who has indicated his/her understanding and acceptance.     Dental advisory given  Plan Discussed with: CRNA  Anesthesia Plan Comments:         Anesthesia Quick Evaluation

## 2019-11-22 NOTE — Transfer of Care (Signed)
Immediate Anesthesia Transfer of Care Note  Patient: Sean Leonard  Procedure(s) Performed: COLONOSCOPY WITH PROPOFOL (N/A )  Patient Location: PACU  Anesthesia Type:General  Level of Consciousness: awake  Airway & Oxygen Therapy: Patient Spontanous Breathing  Post-op Assessment: Report given to RN  Post vital signs: Reviewed and stable  Last Vitals:  Vitals Value Taken Time  BP 90/52 11/22/19 0937  Temp    Pulse 83 11/22/19 0938  Resp 26 11/22/19 0938  SpO2 92 % 11/22/19 0938  Vitals shown include unvalidated device data.  Last Pain:  Vitals:   11/22/19 0913  TempSrc:   PainSc: 0-No pain         Complications: No complications documented.

## 2019-11-23 DIAGNOSIS — I10 Essential (primary) hypertension: Secondary | ICD-10-CM | POA: Diagnosis not present

## 2019-11-23 DIAGNOSIS — E039 Hypothyroidism, unspecified: Secondary | ICD-10-CM | POA: Diagnosis not present

## 2019-11-23 DIAGNOSIS — E7849 Other hyperlipidemia: Secondary | ICD-10-CM | POA: Diagnosis not present

## 2019-11-24 ENCOUNTER — Encounter (HOSPITAL_COMMUNITY): Payer: Self-pay | Admitting: Internal Medicine

## 2019-11-25 NOTE — Interval H&P Note (Signed)
History and Physical Interval Note:  11/25/2019 10:17 AM  Sean Leonard  has presented today for surgery, with the diagnosis of WEIGHT LOSS, POSITIVE IFOT.  The various methods of treatment have been discussed with the patient and family. After consideration of risks, benefits and other options for treatment, the patient has consented to  Procedure(s) with comments: COLONOSCOPY WITH PROPOFOL (N/A) - 9:45am as a surgical intervention.  The patient's history has been reviewed, patient examined, no change in status, stable for surgery.  I have reviewed the patient's chart and labs.  Questions were answered to the patient's satisfaction.     [name]

## 2019-12-02 ENCOUNTER — Ambulatory Visit: Payer: Medicare HMO | Admitting: Nurse Practitioner

## 2019-12-02 NOTE — H&P (Signed)
No change 

## 2019-12-02 NOTE — Interval H&P Note (Signed)
History and Physical Interval Note:  12/02/2019 10:31 AM  Sean Leonard  has presented today for surgery, with the diagnosis of WEIGHT LOSS, POSITIVE IFOT.  The various methods of treatment have been discussed with the patient and family. After consideration of risks, benefits and other options for treatment, the patient has consented to  Procedure(s) with comments: COLONOSCOPY WITH PROPOFOL (N/A) - 9:45am as a surgical intervention.  The patient's history has been reviewed, patient examined, no change in status, stable for surgery.  I have reviewed the patient's chart and labs.  Questions were answered to the patient's satisfaction.     [name]  No change

## 2019-12-02 NOTE — Interval H&P Note (Signed)
History and Physical Interval Note:  12/02/2019 10:31 AM  Sean Leonard  has presented today for surgery, with the diagnosis of WEIGHT LOSS, POSITIVE IFOT.  The various methods of treatment have been discussed with the patient and family. After consideration of risks, benefits and other options for treatment, the patient has consented to  Procedure(s) with comments: COLONOSCOPY WITH PROPOFOL (N/A) - 9:45am as a surgical intervention.  The patient's history has been reviewed, patient examined, no change in status, stable for surgery.  I have reviewed the patient's chart and labs.  Questions were answered to the patient's satisfaction.     [name]

## 2019-12-02 NOTE — Interval H&P Note (Signed)
History and Physical Interval Note:  12/02/2019 10:31 AM  Sean Leonard  has presented today for surgery, with the diagnosis of WEIGHT LOSS, POSITIVE IFOT.  The various methods of treatment have been discussed with the patient and family. After consideration of risks, benefits and other options for treatment, the patient has consented to  Procedure(s) with comments: COLONOSCOPY WITH PROPOFOL (N/A) - 9:45am as a surgical intervention.  The patient's history has been reviewed, patient examined, no change in status, stable for surgery.  I have reviewed the patient's chart and labs.  Questions were answered to the patient's satisfaction.     Sean Leonard  No change

## 2019-12-02 NOTE — Interval H&P Note (Signed)
History and Physical Interval Note: No change 12/02/2019 10:31 AM  Sean Leonard  has presented today for surgery, with the diagnosis of WEIGHT LOSS, POSITIVE IFOT.  The various methods of treatment have been discussed with the patient and family. After consideration of risks, benefits and other options for treatment, the patient has consented to  Procedure(s) with comments: COLONOSCOPY WITH PROPOFOL (N/A) - 9:45am as a surgical intervention.  The patient's history has been reviewed, patient examined, no change in status, stable for surgery.  I have reviewed the patient's chart and labs.  Questions were answered to the patient's satisfaction.     [name]

## 2019-12-02 NOTE — Interval H&P Note (Signed)
History and Physical Interval Note:  12/02/2019 10:30 AM  Sean Leonard  has presented today for surgery, with the diagnosis of WEIGHT LOSS, POSITIVE IFOT.  The various methods of treatment have been discussed with the patient and family. After consideration of risks, benefits and other options for treatment, the patient has consented to  Procedure(s) with comments: COLONOSCOPY WITH PROPOFOL (N/A) - 9:45am as a surgical intervention.  The patient's history has been reviewed, patient examined, no change in status, stable for surgery.  I have reviewed the patient's chart and labs.  Questions were answered to the patient's satisfaction.     [name]

## 2019-12-23 DIAGNOSIS — I10 Essential (primary) hypertension: Secondary | ICD-10-CM | POA: Diagnosis not present

## 2019-12-23 DIAGNOSIS — E039 Hypothyroidism, unspecified: Secondary | ICD-10-CM | POA: Diagnosis not present

## 2019-12-23 DIAGNOSIS — E7849 Other hyperlipidemia: Secondary | ICD-10-CM | POA: Diagnosis not present

## 2020-01-10 NOTE — Progress Notes (Deleted)
Referring Provider: Lavella Lemons, PA Primary Care Physician:  Lavella Lemons, PA Primary GI Physician: Dr. Gala Romney  No chief complaint on file.   HPI:   Sean Leonard is a 74 y.o. male with history of Barrett's esophagus, adenomatous colon polyps, anemia in the setting of lower GI bleed in 2018 with ulcer in the ascending and transverse colon and rectum s/p clipping to site in ascending colon (ulcer about to be secondary to prior polypectomy).  Over the last 1.5years , patient has been struggling with weight loss, early satiety, and anorexia.  Prior CTA 09/2018 with patent mesenteric vasculature.  PSA and TSH within normal limits March 2021.  No significant findings on CBC or CMP in June 2021.  CT A/P with contrast June 2021 with no acute findings to explain weight loss.  EGD June 2021 with normal esophagus, no Barrett's, abnormal gastric mucosa with pathology consistent with mild chronic gastritis negative for H. pylori, small hiatal hernia, normal examined duodenum.  He is presenting today for follow-up s/p colonoscopy.  At his last visit in August 2021, he continued with poor appetite with 9 pound weight loss in 2 months, 25 pound weight loss in 15 months.  Admitted early satiety.  Was eating small meals.  Drinking 1 Ensure daily when he could afford it.  No other significant upper or lower GI symptoms.  He resumed mirtazapine a couple months prior which was helping somewhat.  He had continued on oral iron since 2018 with no recent updated iron panel.  His hemoglobin was normal at 14.7 in June.  Plan to update his iron panel and hopefully discontinue iron if it was within normal limits, obtain IFOBT, GES, recommended protein shakes rather than Ensure, continue mirtazapine, and consider colonoscopy.  Iron panel 11/11/2019: Iron 40 (L), percent saturation 15% (L), ferritin 299. I FOBT positive 11/16/2019.  He was scheduled for colonoscopy. GES 11/18/2019 within normal limits.  Colonoscopy  11/22/2019: Moderate sized, grade 1, nonbleeding internal hemorrhoids, scattered diverticula throughout the colon, otherwise normal exam.  Recommended no repeat colonoscopy due to age.  Today:   ?Givens, HIV, Hep C, PCP for other non GI causes, celiac testing  Past Medical History:  Diagnosis Date  . DDD (degenerative disc disease), lumbar   . Diverticula of colon   . GERD (gastroesophageal reflux disease)   . Hypothyroidism   . Iliac artery aneurysm, bilateral ([place])   . PAD (peripheral artery disease) ([place])   . Peptic ulcer   . Prostatitis     Past Surgical History:  Procedure Laterality Date  . BIOPSY  01/02/2017   Procedure: BIOPSY;  Surgeon: Daneil Dolin, MD;  [facility];  Service: Endoscopy;;  GASTRIC AND ESOPHAGEAL BIOPSIES  . BIOPSY  09/14/2019   Procedure: BIOPSY;  Surgeon: Daneil Dolin, MD;  [facility];  Service: Endoscopy;;  . COLONOSCOPY  April 2005   Dr. Irving Shows. Small internal hemorrhoids. Multiple small scattered diverticula in the cecum and descending colon.  . COLONOSCOPY N/A 01/18/2014   Procedure: COLONOSCOPY;  Surgeon: Daneil Dolin, MD;  [facility];  Service: Endoscopy;  Laterality: N/A;  1100  . COLONOSCOPY N/A 06/23/2016   Procedure: COLONOSCOPY;  Surgeon: Rogene Houston, MD;  [facility];  Service: Endoscopy;  Laterality: N/A;  . COLONOSCOPY WITH ESOPHAGOGASTRODUODENOSCOPY (EGD) N/A 12/16/2012   Dr. Gala Romney: rectal and colonic polyps with inadequate preparation, tubular adenoma. EGD with biopsy proven short segment Barrett's esophagus,  hiatal hernia, mild chronic inactive gastritis   . COLONOSCOPY WITH PROPOFOL N/A 06/24/2016   Procedure: COLONOSCOPY WITH PROPOFOL;  Surgeon: Danie Binder, MD;  [facility];  Service: Endoscopy;  Laterality: N/A;  . COLONOSCOPY WITH PROPOFOL N/A 06/28/2016   Procedure: COLONOSCOPY WITH PROPOFOL;  Surgeon: Mauri Pole, MD;  Location: [facility];   Service: Endoscopy;  Laterality: N/A;  . COLONOSCOPY WITH PROPOFOL N/A 11/22/2019   Procedure: COLONOSCOPY WITH PROPOFOL;  Surgeon: Daneil Dolin, MD;  [facility];  Service: Endoscopy;  Laterality: N/A;  9:45am  . ESOPHAGOGASTRODUODENOSCOPY  July 2005   Dr. Irving Shows grade 2 esophagitis at the GE junction. Acute gastritis. Multiple acute active, deep, irregular, friable ulcers in the antrum and body of the stomach. 2 active, deep, friable, irregular ulcers in the duodenum. Pathology not available.  . ESOPHAGOGASTRODUODENOSCOPY N/A 01/18/2014   Procedure: ESOPHAGOGASTRODUODENOSCOPY (EGD);  Surgeon: Daneil Dolin, MD;  [facility];  Service: Endoscopy;  Laterality: N/A;  . ESOPHAGOGASTRODUODENOSCOPY (EGD) WITH PROPOFOL N/A 01/02/2017   Rourk: Mucosal changes in the esophagus, undilated Z-line.  Small hiatal hernia.  Some snake skinning or fish scale appearance to the gastric mucosa.  Gastric biopsy with slight chronic inflammation, no H. pylori.  Esophageal biopsy with mild inflammation consistent with reflux.  . ESOPHAGOGASTRODUODENOSCOPY (EGD) WITH PROPOFOL N/A 09/14/2019   Procedure: ESOPHAGOGASTRODUODENOSCOPY (EGD) WITH PROPOFOL;  Surgeon: Daneil Dolin, MD;  Normal esophagus, no evidence of Barrett's epithelium, abnormal gastric mucosa of uncertain significance s/p biopsy, small hiatal hernia, normal examined duodenum.  Pathology revealed mild chronic gastritis, negative for H. Pylori.  Marland Kitchen EXCISION ORAL TUMOR Left 08/19/2018   Procedure: EXCISION LEFT BUCCAL MASS;  Surgeon: Leta Baptist, MD;  Location: [place];  Service: ENT;  Laterality: Left;  . IR ANGIOGRAM VISCERAL SELECTIVE  06/26/2016  . IR ANGIOGRAM VISCERAL SELECTIVE  06/26/2016  . IR ANGIOGRAM VISCERAL SELECTIVE  06/26/2016  . IR US GUIDE VASC ACCESS RIGHT  06/26/2016  . NOSE SURGERY    . POLYPECTOMY  06/24/2016   Procedure: POLYPECTOMY;  Surgeon: Danie Binder, MD;  [facility];  Service:  Endoscopy;;  cecal  . THYROIDECTOMY    . TONSILLECTOMY      Current Outpatient Medications  Medication Sig Dispense Refill  . Cholecalciferol (VITAMIN D3) 125 MCG (5000 UT) CAPS Take 5,000 Units by mouth daily.     Marland Kitchen levothyroxine (SYNTHROID) 100 MCG tablet Take 100 mcg by mouth daily before breakfast.     . mirtazapine (REMERON) 15 MG tablet Take 15 mg by mouth at bedtime.    . Multiple Vitamin (MULTIVITAMIN WITH MINERALS) TABS tablet Take 1 tablet by mouth daily.    . Omega-3 Fatty Acids (FISH OIL PO) Take 2,400 mg by mouth daily.    Marland Kitchen omeprazole (PRILOSEC) 20 MG capsule TAKE 1 CAPSULE EVERY DAY (Patient taking differently: Take 20 mg by mouth daily before breakfast. ) 90 capsule 0  . pravastatin (PRAVACHOL) 20 MG tablet Take 20 mg by mouth daily.     . sildenafil (VIAGRA) 100 MG tablet Take 100 mg by mouth daily as needed for erectile dysfunction.      No current facility-administered medications for this visit.    Allergies as of 01/12/2020  . (No Known Allergies)    Family History  Problem Relation Age of Onset  . Hypertension Mother   . Heart disease Mother        before age 69  .  Heart disease Father        before age 59  . Hyperlipidemia Sister   . Cancer Sister   . Colon cancer Neg Hx   . Liver disease Neg Hx     Social History   Socioeconomic History  . Marital status: Single    Spouse name: Not on file  . Number of children: Not on file  . Years of education: Not on file  . Highest education level: Not on file  Occupational History  . Not on file  Tobacco Use  . Smoking status: Former Smoker    Years: 25.00    Types: Cigars    Quit date: 11/27/2006    Years since quitting: 13.1  . Smokeless tobacco: Never Used  . Tobacco comment: Quit in 2009  Vaping Use  . Vaping Use: Never used  Substance and Sexual Activity  . Alcohol use: Yes    Alcohol/week: 2.0 standard drinks    Types: 2 Cans of beer per week    Comment: occasioanal   . Drug use: Yes     Frequency: 5.0 times per week    Types: Marijuana    Comment: Few times a week.   Marland Kitchen Sexual activity: Not on file  Other Topics Concern  . Not on file  Social History Narrative   1 deceased child. 1 living   Social Determinants of Health   Financial Resource Strain:   . Difficulty of Paying Living Expenses: Not on file  Food Insecurity:   . Worried About Charity fundraiser in the Last Year: Not on file  . Ran Out of Food in the Last Year: Not on file  Transportation Needs:   . Lack of Transportation (Medical): Not on file  . Lack of Transportation (Non-Medical): Not on file  Physical Activity:   . Days of Exercise per Week: Not on file  . Minutes of Exercise per Session: Not on file  Stress:   . Feeling of Stress : Not on file  Social Connections:   . Frequency of Communication with Friends and Family: Not on file  . Frequency of Social Gatherings with Friends and Family: Not on file  . Attends Religious Services: Not on file  . Active Member of Clubs or Organizations: Not on file  . Attends Archivist Meetings: Not on file  . Marital Status: Not on file    Review of Systems: Gen: Denies fever, chills, anorexia. Denies fatigue, weakness, weight loss.  CV: Denies chest pain, palpitations, syncope, peripheral edema, and claudication. Resp: Denies dyspnea at rest, cough, wheezing, coughing up blood, and pleurisy. GI: Denies vomiting blood, jaundice, and fecal incontinence.   Denies dysphagia or odynophagia. Derm: Denies rash, itching, dry skin Psych: Denies depression, anxiety, memory loss, confusion. No homicidal or suicidal ideation.  Heme: Denies bruising, bleeding, and enlarged lymph nodes.  Physical Exam: There were no vitals taken for this visit. General:   Alert and oriented. No distress noted. Pleasant and cooperative.  Head:  Normocephalic and atraumatic. Eyes:  Conjuctiva clear without scleral icterus. Mouth:  Oral mucosa pink and moist. Good dentition.  No lesions. Heart:  S1, S2 present without murmurs appreciated. Lungs:  Clear to auscultation bilaterally. No wheezes, rales, or rhonchi. No distress.  Abdomen:  +BS, soft, non-tender and non-distended. No rebound or guarding. No HSM or masses noted. Msk:  Symmetrical without gross deformities. Normal posture. Extremities:  Without edema. Neurologic:  Alert and  oriented x4 Psych:  Alert and cooperative.  Normal mood and affect.

## 2020-01-12 ENCOUNTER — Encounter: Payer: Self-pay | Admitting: Internal Medicine

## 2020-01-12 ENCOUNTER — Ambulatory Visit: Payer: Medicare HMO | Admitting: Gastroenterology

## 2020-01-28 DIAGNOSIS — R634 Abnormal weight loss: Secondary | ICD-10-CM | POA: Diagnosis not present

## 2020-01-28 DIAGNOSIS — I10 Essential (primary) hypertension: Secondary | ICD-10-CM | POA: Diagnosis not present

## 2020-01-28 DIAGNOSIS — Z1331 Encounter for screening for depression: Secondary | ICD-10-CM | POA: Diagnosis not present

## 2020-01-28 DIAGNOSIS — Z1389 Encounter for screening for other disorder: Secondary | ICD-10-CM | POA: Diagnosis not present

## 2020-01-28 DIAGNOSIS — E039 Hypothyroidism, unspecified: Secondary | ICD-10-CM | POA: Diagnosis not present

## 2020-03-02 NOTE — Progress Notes (Signed)
Referring Provider: Lavella Lemons, PA Primary Care Physician:  Lavella Lemons, PA Primary GI Physician: Dr. Gala Romney  Chief Complaint  Patient presents with  . Anemia  . early satiety    HPI:   Sean Leonard is a 74 y.o. male presenting today for follow-up of weight loss, early satiety/anorexia, and history of IDA with recent positive IFOBT s/p colonoscopy. IDA had developed in 2018 in the setting of a lower GI bleed. Colonoscopy revealed ulcer in asending and transverse colon and rectum s/p clipping to site in ascending colon (ulcer thought to be secondary to prior polypectomy). Patient has maintained on oral iron since 2018. Also with history of Barrett's esophagus and adenomatous colon polyps.  Recent evaluation has included: CTA July 2020 without mesenteric ischemia. TSH within normal limits in March 2021. Labs in June 2021: CBC and CMP essentially normal. CT A/P with contrast 09/20/2019: No acute findings to explain weight loss. EGD 09/22/2019: Normal esophagus, no evidence of Barrett's epithelium, abnormal gastric mucosa of uncertain significance s/p biopsy, small hiatal hernia, normal examined duodenum.  Pathology revealed mild chronic gastritis, negative for H. Pylori.  At his last visit in August 2021, he had an additional 9 pound weight loss over 2 months, poor appetite, early satiety. Had started mirtazapine which was helping somewhat. Drinking Ensure when he could afford it. Stools were dark on iron. No other significant GI symptoms. Plan update iron panel with possibility of discontinuing oral iron, gastric emptying study, I FOBT, protein shakes twice daily.  IFOBT was positive.  Recommend repeat colonoscopy. Iron was low at 40, saturation 15%, ferritin 299. GES within normal limits.  Colonoscopy updated 11/22/2019: Nonbleeding internal hemorrhoids, pancolonic diverticulosis.  No recommendations to repeat due to age.  Today: Weight is up 8 lbs over the last 4 months.  Remeron has been increased to 30 mg daily. Eating better. Sensation of early satiety has resolved. Eating 3 meals a day. Still drinking ensure daily. No nausea or vomiting. No GERD symptoms or dysphagia. Thinks he has had labs since June with PCP. No brbpr or melena. BMs daily. No constipation or diarrhea.   Continues on iron daily.   Max weight in the last couple of years 194 lbs on 04/29/2018 08/19/2018: 183 LBS 06/19/2019: 180 LBS 07/06/2019: 174 LBS 09/01/2019: 167 LBS 11/10/2019: 158 LBS 03/03/20: 166 LBS  Past Medical History:  Diagnosis Date  . DDD (degenerative disc disease), lumbar   . Diverticula of colon   . GERD (gastroesophageal reflux disease)   . Hypothyroidism   . Iliac artery aneurysm, bilateral ([place])   . PAD (peripheral artery disease) ([place])   . Peptic ulcer   . Prostatitis     Past Surgical History:  Procedure Laterality Date  . BIOPSY  01/02/2017   Procedure: BIOPSY;  Surgeon: Daneil Dolin, MD;  [facility];  Service: Endoscopy;;  GASTRIC AND ESOPHAGEAL BIOPSIES  . BIOPSY  09/14/2019   Procedure: BIOPSY;  Surgeon: Daneil Dolin, MD;  [facility];  Service: Endoscopy;;  . COLONOSCOPY  April 2005   Dr. Irving Shows. Small internal hemorrhoids. Multiple small scattered diverticula in the cecum and descending colon.  . COLONOSCOPY N/A 01/18/2014   Procedure: COLONOSCOPY;  Surgeon: Daneil Dolin, MD;  [facility];  Service: Endoscopy;  Laterality: N/A;  1100  . COLONOSCOPY N/A 06/23/2016   Procedure: COLONOSCOPY;  Surgeon: Rogene Houston, MD;  [facility];  Service: Endoscopy;  Laterality: N/A;  .  COLONOSCOPY WITH ESOPHAGOGASTRODUODENOSCOPY (EGD) N/A 12/16/2012   Dr. Gala Romney: rectal and colonic polyps with inadequate preparation, tubular adenoma. EGD with biopsy proven short segment Barrett's esophagus, hiatal hernia, mild chronic inactive gastritis   . COLONOSCOPY WITH PROPOFOL N/A 06/24/2016   Procedure: COLONOSCOPY WITH  PROPOFOL;  Surgeon: Danie Binder, MD;  [facility];  Service: Endoscopy;  Laterality: N/A;  . COLONOSCOPY WITH PROPOFOL N/A 06/28/2016   Procedure: COLONOSCOPY WITH PROPOFOL;  Surgeon: Mauri Pole, MD;  Location: [facility];  Service: Endoscopy;  Laterality: N/A;  . COLONOSCOPY WITH PROPOFOL N/A 11/22/2019   Procedure: COLONOSCOPY WITH PROPOFOL;  Surgeon: Daneil Dolin, Nonbleeding internal hemorrhoids, pancolonic diverticulosis.  No recommendations to repeat due to age.  . ESOPHAGOGASTRODUODENOSCOPY  July 2005   Dr. Irving Shows grade 2 esophagitis at the GE junction. Acute gastritis. Multiple acute active, deep, irregular, friable ulcers in the antrum and body of the stomach. 2 active, deep, friable, irregular ulcers in the duodenum. Pathology not available.  . ESOPHAGOGASTRODUODENOSCOPY N/A 01/18/2014   Procedure: ESOPHAGOGASTRODUODENOSCOPY (EGD);  Surgeon: Daneil Dolin, MD;  [facility];  Service: Endoscopy;  Laterality: N/A;  . ESOPHAGOGASTRODUODENOSCOPY (EGD) WITH PROPOFOL N/A 01/02/2017   Rourk: Mucosal changes in the esophagus, undilated Z-line.  Small hiatal hernia.  Some snake skinning or fish scale appearance to the gastric mucosa.  Gastric biopsy with slight chronic inflammation, no H. pylori.  Esophageal biopsy with mild inflammation consistent with reflux.  . ESOPHAGOGASTRODUODENOSCOPY (EGD) WITH PROPOFOL N/A 09/14/2019   Procedure: ESOPHAGOGASTRODUODENOSCOPY (EGD) WITH PROPOFOL;  Surgeon: Daneil Dolin, MD;  Normal esophagus, no evidence of Barrett's epithelium, abnormal gastric mucosa of uncertain significance s/p biopsy, small hiatal hernia, normal examined duodenum.  Pathology revealed mild chronic gastritis, negative for H. Pylori.  Marland Kitchen EXCISION ORAL TUMOR Left 08/19/2018   Procedure: EXCISION LEFT BUCCAL MASS;  Surgeon: Leta Baptist, MD;  Location: [place];  Service: ENT;  Laterality: Left;  . IR ANGIOGRAM VISCERAL SELECTIVE  06/26/2016   . IR ANGIOGRAM VISCERAL SELECTIVE  06/26/2016  . IR ANGIOGRAM VISCERAL SELECTIVE  06/26/2016  . IR US GUIDE VASC ACCESS RIGHT  06/26/2016  . NOSE SURGERY    . POLYPECTOMY  06/24/2016   Procedure: POLYPECTOMY;  Surgeon: Danie Binder, MD;  [facility];  Service: Endoscopy;;  cecal  . THYROIDECTOMY    . TONSILLECTOMY      Current Outpatient Medications  Medication Sig Dispense Refill  . Cholecalciferol (VITAMIN D3) 125 MCG (5000 UT) CAPS Take 5,000 Units by mouth daily.     Marland Kitchen levothyroxine (SYNTHROID) 100 MCG tablet Take 100 mcg by mouth daily before breakfast.     . mirtazapine (REMERON) 30 MG tablet Take 30 mg by mouth at bedtime.    . Multiple Vitamin (MULTIVITAMIN WITH MINERALS) TABS tablet Take 1 tablet by mouth daily.    . Omega-3 Fatty Acids (FISH OIL PO) Take 2,400 mg by mouth daily.    Marland Kitchen omeprazole (PRILOSEC) 20 MG capsule TAKE 1 CAPSULE EVERY DAY (Patient taking differently: Take 20 mg by mouth daily before breakfast.) 90 capsule 0  . pravastatin (PRAVACHOL) 20 MG tablet Take 20 mg by mouth daily.     . sildenafil (VIAGRA) 100 MG tablet Take 100 mg by mouth daily as needed for erectile dysfunction.      No current facility-administered medications for this visit.    Allergies as of 03/03/2020  . (No Known Allergies)    Family History  Problem  Relation Age of Onset  . Hypertension Mother   . Heart disease Mother        before age 34  . Heart disease Father        before age 23  . Hyperlipidemia Sister   . Cancer Sister   . Colon cancer Neg Hx   . Liver disease Neg Hx     Social History   Socioeconomic History  . Marital status: Single    Spouse name: Not on file  . Number of children: Not on file  . Years of education: Not on file  . Highest education level: Not on file  Occupational History  . Not on file  Tobacco Use  . Smoking status: Former Smoker    Years: 25.00    Types: Cigars    Quit date: 11/27/2006    Years since quitting: 13.2  .  Smokeless tobacco: Never Used  . Tobacco comment: Quit in 2009  Vaping Use  . Vaping Use: Never used  Substance and Sexual Activity  . Alcohol use: Yes    Alcohol/week: 2.0 standard drinks    Types: 2 Cans of beer per week    Comment: occasioanal- none over the last year (02/2020)  . Drug use: Yes    Frequency: 5.0 times per week    Types: Marijuana    Comment: Few times a week.   Marland Kitchen Sexual activity: Not on file  Other Topics Concern  . Not on file  Social History Narrative   1 deceased child. 1 living   Social Determinants of Health   Financial Resource Strain: Not on file  Food Insecurity: Not on file  Transportation Needs: Not on file  Physical Activity: Not on file  Stress: Not on file  Social Connections: Not on file    Review of Systems: Gen: Denies fever, chills, cold or flulike symptoms, lightheadedness, dizziness, presyncope, syncope. CV: Denies chest pain or palpitations Resp: Denies dyspnea or cough GI: See HPI Heme: See HPI  Physical Exam: BP 139/89   Pulse 76   Temp (!) 97.5 F (36.4 C)   Ht 6\' 2"  (1.88 m)   Wt 166 lb (75.3 kg)   BMI 21.31 kg/m  General:   Alert and oriented. No distress noted. Pleasant and cooperative.  Head:  Normocephalic and atraumatic. Eyes:  Conjuctiva clear without scleral icterus. Heart:  S1, S2 present without murmurs appreciated. Lungs:  Clear to auscultation bilaterally. No wheezes, rales, or rhonchi. No distress.  Abdomen:  +BS, soft, non-tender and non-distended. No rebound or guarding. No HSM or masses noted. Msk:  Symmetrical without gross deformities. Normal posture. Extremities:  Without edema. Neurologic:  Alert and  oriented x4 Psych: Normal mood and affect.

## 2020-03-03 ENCOUNTER — Encounter: Payer: Self-pay | Admitting: Gastroenterology

## 2020-03-03 ENCOUNTER — Other Ambulatory Visit: Payer: Self-pay

## 2020-03-03 ENCOUNTER — Ambulatory Visit: Payer: Medicare HMO | Admitting: Gastroenterology

## 2020-03-03 VITALS — BP 139/89 | HR 76 | Temp 97.5°F | Ht 74.0 in | Wt 166.0 lb

## 2020-03-03 DIAGNOSIS — R634 Abnormal weight loss: Secondary | ICD-10-CM | POA: Diagnosis not present

## 2020-03-03 DIAGNOSIS — D649 Anemia, unspecified: Secondary | ICD-10-CM | POA: Diagnosis not present

## 2020-03-03 NOTE — Assessment & Plan Note (Addendum)
74 year old male who had been experiencing weight loss, lack of appetite, and early satiety over the last 1+ year.  Total of 36 pound weight loss over 1.5 years.  Significant GI work-up including CTA July 2020 without mesenteric ischemia, TSH normal March 2021, CBC and CMP essentially normal in June 2021, CT A/P with contrast with no findings to explain weight loss in June 2021, EGD in June 2021 with normal esophagus, no evidence of Barrett's epithelium, chronic mild gastritis without H. pylori, small hiatal hernia, GES normal in August 2021, colonoscopy August 2021 with nonbleeding internal hemorrhoids, pancolonic diverticulosis, no recommendations to repeat due to age.  He resumed Remeron around June 2021 which has been increased to 30 mg daily.  He has now gained 8 pounds in the last 4 months and has had resolution of early satiety and lack of appetite.  No other significant GI symptoms.  As patient is gaining weight and has had resolution of early satiety/lack of appetite, we will continue to monitor for now. Notably, he does have history of IDA in the setting of GI bleed in 2018.  Last hemoglobin in June 2021 within normal limits.  Notably, iron panel in August with iron low at 40, saturation low at 15%, ferritin 299.  He has continued on oral iron daily.  No overt GI bleeding. In the setting of oral iron, can't rule out small bowel etiology contributing to ongoing low iron and % saturation and history of weight loss.   Plan:  Request recent labs from PCP.  If iron panel is not include, consider updating this and discontinuing oral iron with close monitoring of CBC and iron panel.  If hemoglobin or iron were to drift down, would need to consider Givens capsule. Otherwise, advised he continue current medications, eating 3 meals daily, and drinking Ensure daily.   Follow-up in 6 months.

## 2020-03-03 NOTE — Progress Notes (Signed)
CC'ED TO PCP 

## 2020-03-03 NOTE — Patient Instructions (Signed)
You have gained 8 pounds over the last 4 months!  This is great!  Continue eating 3 meals a day and drinking Ensure daily.   We are requesting recent blood work from your primary care and will let you know if we have any further recommendations.  We will follow up with you in 6 months.  Do not hesitate to call if you have any questions or concerns.  It was good to see you today!  I hope you have a great Christmas!  Aliene Altes, PA-C [facility]

## 2020-03-03 NOTE — Assessment & Plan Note (Addendum)
74 year old male with history of anemia in the setting of GI bleed in 2018. He has maintained on oral iron daily with no overt GI bleeding.  CBC in June 2021 with hemoglobin 14.7.  Iron panel with ferritin 299, iron 48 (L), saturation 15% (L).  I FOBT + 11/16/2019.  Prior EGD for Barrett's esophagus surveillance with normal esophagus, no evidence of Barrett's epithelium, mild chronic gastritis without H. pylori, small hiatal hernia.  Colonoscopy updated in August 2021 due to positive I FOBT and weight loss revealing nonbleeding internal hemorrhoids, pancolonic diverticulosis.  Notably, patient has gained 8 pounds in the last 4 months with the help of Remeron. No significant upper or lower GI symptoms.  Reports having blood work with PCP since June.  No additional evaluation for anemia planned at this time.  Will request recent labs from PCP.  Pending results, consider updating iron panel and having patient discontinue iron followed by monitoring of iron panel and CBC.  Should iron and hemoglobin decline, would need to consider Givens capsule to ensure no small bowel etiology contributing to IDA and weight loss. Follow-up in office in 6 months.

## 2020-03-20 ENCOUNTER — Telehealth: Payer: Self-pay | Admitting: Gastroenterology

## 2020-03-20 NOTE — Telephone Encounter (Signed)
Patient reported having labs completed since June with PCP.  We requested records, but received a report stating patient had no recent labs.   Please arrange CBC and iron panel with ferritin. Dx: IDA

## 2020-03-21 ENCOUNTER — Other Ambulatory Visit: Payer: Self-pay

## 2020-03-21 ENCOUNTER — Telehealth: Payer: Self-pay | Admitting: Internal Medicine

## 2020-03-21 DIAGNOSIS — R634 Abnormal weight loss: Secondary | ICD-10-CM

## 2020-03-21 DIAGNOSIS — D509 Iron deficiency anemia, unspecified: Secondary | ICD-10-CM

## 2020-03-21 NOTE — Telephone Encounter (Signed)
Phoned and spoke with the pt regarding having blood work done and he agreed. Instructions was given to the pt and I thought he understood.  2. Blood work put in @ [facility] for pt to have CBC, Iron panel/ferritin (Dx.IDA)

## 2020-03-21 NOTE — Telephone Encounter (Signed)
Patient called and said he was supposed to have labs, wanted to make sure the lab had the orders because he does not have them

## 2020-03-21 NOTE — Telephone Encounter (Signed)
See note (Alicia's box)

## 2020-03-21 NOTE — Telephone Encounter (Signed)
Pt called and clearer instructions on what to do. I explained as best as I could and advised the pt they already have paperwork in the system and there's no need to carry paperwork. He agreed.

## 2020-03-24 DIAGNOSIS — I10 Essential (primary) hypertension: Secondary | ICD-10-CM | POA: Diagnosis not present

## 2020-03-24 DIAGNOSIS — E7849 Other hyperlipidemia: Secondary | ICD-10-CM | POA: Diagnosis not present

## 2020-03-24 DIAGNOSIS — E039 Hypothyroidism, unspecified: Secondary | ICD-10-CM | POA: Diagnosis not present

## 2020-04-03 LAB — CBC WITH DIFFERENTIAL/PLATELET
Absolute Monocytes: 504 cells/uL (ref 200–950)
Basophils Absolute: 11 cells/uL (ref 0–200)
Basophils Relative: 0.2 %
Eosinophils Absolute: 50 cells/uL (ref 15–500)
Eosinophils Relative: 0.9 %
HCT: 39.2 % (ref 38.5–50.0)
Hemoglobin: 12.8 g/dL — ABNORMAL LOW (ref 13.2–17.1)
Lymphs Abs: 2195 cells/uL (ref 850–3900)
MCH: 29.4 pg (ref 27.0–33.0)
MCHC: 32.7 g/dL (ref 32.0–36.0)
MCV: 90.1 fL (ref 80.0–100.0)
MPV: 10.2 fL (ref 7.5–12.5)
Monocytes Relative: 9 %
Neutro Abs: 2839 cells/uL (ref 1500–7800)
Neutrophils Relative %: 50.7 %
Platelets: 241 10*3/uL (ref 140–400)
RBC: 4.35 10*6/uL (ref 4.20–5.80)
RDW: 12.9 % (ref 11.0–15.0)
Total Lymphocyte: 39.2 %
WBC: 5.6 10*3/uL (ref 3.8–10.8)

## 2020-04-03 LAB — IRON,TIBC AND FERRITIN PANEL
%SAT: 22 % (calc) (ref 20–48)
Ferritin: 186 ng/mL (ref 24–380)
Iron: 53 ug/dL (ref 50–180)
TIBC: 245 mcg/dL (calc) — ABNORMAL LOW (ref 250–425)

## 2020-04-12 ENCOUNTER — Telehealth: Payer: Self-pay

## 2020-04-12 NOTE — Telephone Encounter (Signed)
Noted. See labs results (04/04/19) for additional recommendations.

## 2020-04-12 NOTE — Telephone Encounter (Signed)
Returned the pt's call LM on his vm to return call for results and questions from the Dr.

## 2020-04-12 NOTE — Telephone Encounter (Signed)
Pt returned call made aware of his results and he is taking his iron daily and has not seen any black stools or blood in stools.

## 2020-04-20 ENCOUNTER — Other Ambulatory Visit: Payer: Self-pay

## 2020-04-20 ENCOUNTER — Telehealth: Payer: Self-pay | Admitting: Internal Medicine

## 2020-04-20 DIAGNOSIS — D509 Iron deficiency anemia, unspecified: Secondary | ICD-10-CM

## 2020-04-20 NOTE — Telephone Encounter (Signed)
Spoke with pt. Pt was given lab results. Pt will repeat labs 05/10/2020. Pt isn't seeing any blood in his stool, no lightheadedness, no weakness ect. Pt is taking iron tablets daily and will continue.

## 2020-04-20 NOTE — Telephone Encounter (Signed)
Pt received letter that we were trying to reach him. Please call [phone]

## 2020-04-20 NOTE — Telephone Encounter (Signed)
Noted  

## 2020-04-26 ENCOUNTER — Telehealth: Payer: Self-pay

## 2020-04-26 NOTE — Telephone Encounter (Signed)
Pt called to let our office know his New insurance- [employer]-  ID# [id number], Group [phone]. Pt no longer has [employer]. Erline Levine can you update this information and let me know when it's been added. Please remove [employer].

## 2020-04-27 ENCOUNTER — Other Ambulatory Visit: Payer: Self-pay

## 2020-04-27 DIAGNOSIS — D509 Iron deficiency anemia, unspecified: Secondary | ICD-10-CM

## 2020-04-27 NOTE — Telephone Encounter (Signed)
Left a detailed message for pt. Pt wanted new lab orders printed and mailed so his new insurance information would be on the orders. Mailed orders to pt.

## 2020-04-27 NOTE — Telephone Encounter (Signed)
Patient updated insurance information has been entered

## 2020-05-11 LAB — IRON,TIBC AND FERRITIN PANEL
%SAT: 18 % (calc) — ABNORMAL LOW (ref 20–48)
Ferritin: 217 ng/mL (ref 24–380)
Iron: 52 ug/dL (ref 50–180)
TIBC: 289 mcg/dL (calc) (ref 250–425)

## 2020-05-11 LAB — CBC
HCT: 42 % (ref 38.5–50.0)
Hemoglobin: 13.7 g/dL (ref 13.2–17.1)
MCH: 29.9 pg (ref 27.0–33.0)
MCHC: 32.6 g/dL (ref 32.0–36.0)
MCV: 91.7 fL (ref 80.0–100.0)
MPV: 9.8 fL (ref 7.5–12.5)
Platelets: 280 10*3/uL (ref 140–400)
RBC: 4.58 10*6/uL (ref 4.20–5.80)
RDW: 13.4 % (ref 11.0–15.0)
WBC: 5.2 10*3/uL (ref 3.8–10.8)

## 2020-05-17 ENCOUNTER — Telehealth: Payer: Self-pay | Admitting: Internal Medicine

## 2020-05-17 NOTE — Telephone Encounter (Signed)
PATIENT CALLED ASKING IF HIS LAB RESULTS WERE BACK, HAD THEM A FEW WEEKS BACK AND HE HAS NOT GOTTEN RESULTS

## 2020-05-17 NOTE — Telephone Encounter (Signed)
Lmom, waiting on a return call.  

## 2020-05-18 ENCOUNTER — Other Ambulatory Visit: Payer: Self-pay

## 2020-05-18 DIAGNOSIS — D509 Iron deficiency anemia, unspecified: Secondary | ICD-10-CM

## 2020-05-19 ENCOUNTER — Other Ambulatory Visit: Payer: Self-pay

## 2020-05-19 DIAGNOSIS — D509 Iron deficiency anemia, unspecified: Secondary | ICD-10-CM

## 2020-07-05 ENCOUNTER — Other Ambulatory Visit: Payer: Self-pay

## 2020-07-05 DIAGNOSIS — D509 Iron deficiency anemia, unspecified: Secondary | ICD-10-CM

## 2020-07-14 DIAGNOSIS — D509 Iron deficiency anemia, unspecified: Secondary | ICD-10-CM | POA: Diagnosis not present

## 2020-07-15 LAB — CBC WITH DIFFERENTIAL/PLATELET
Absolute Monocytes: 571 cells/uL (ref 200–950)
Basophils Absolute: 11 cells/uL (ref 0–200)
Basophils Relative: 0.2 %
Eosinophils Absolute: 62 cells/uL (ref 15–500)
Eosinophils Relative: 1.1 %
HCT: 43 % (ref 38.5–50.0)
Hemoglobin: 13.9 g/dL (ref 13.2–17.1)
Lymphs Abs: 2201 cells/uL (ref 850–3900)
MCH: 28.8 pg (ref 27.0–33.0)
MCHC: 32.3 g/dL (ref 32.0–36.0)
MCV: 89.2 fL (ref 80.0–100.0)
MPV: 9.7 fL (ref 7.5–12.5)
Monocytes Relative: 10.2 %
Neutro Abs: 2755 cells/uL (ref 1500–7800)
Neutrophils Relative %: 49.2 %
Platelets: 325 10*3/uL (ref 140–400)
RBC: 4.82 10*6/uL (ref 4.20–5.80)
RDW: 13.3 % (ref 11.0–15.0)
Total Lymphocyte: 39.3 %
WBC: 5.6 10*3/uL (ref 3.8–10.8)

## 2020-07-15 LAB — IRON,TIBC AND FERRITIN PANEL
%SAT: 31 % (calc) (ref 20–48)
Ferritin: 317 ng/mL (ref 24–380)
Iron: 76 ug/dL (ref 50–180)
TIBC: 243 mcg/dL (calc) — ABNORMAL LOW (ref 250–425)

## 2020-07-26 ENCOUNTER — Encounter: Payer: Self-pay | Admitting: Gastroenterology

## 2020-09-01 ENCOUNTER — Ambulatory Visit: Payer: Medicare HMO | Admitting: Gastroenterology

## 2020-11-27 NOTE — Progress Notes (Signed)
Primary Care Physician:  Lavella Lemons, PA  Primary GI: Dr. Gala Romney  Patient Location: Home   Provider Location: [facility] office   Reason for Visit: Follow-up   Persons present on the virtual encounter, with roles: Aliene Altes, PA-C (provider), Sean Leonard (patient)   Total time (minutes) spent on medical discussion: 12 minutes  Virtual Visit via Telephone Note Due to COVID-19, visit is conducted virtually and was requested by patient.   I connected with Sean Leonard on 11/29/20 at  8:30 AM EDT by telephone and verified that I am speaking with the correct person using two identifiers.   I discussed the limitations, risks, security and privacy concerns of performing an evaluation and management service by telephone and the availability of in person appointments. I also discussed with the patient that there may be a patient responsible charge related to this service. The patient expressed understanding and agreed to proceed.  Chief Complaint  Patient presents with   Weight Loss    160 lbs now   Anemia    Hasn't seen any blood or dark stools. Not taking Iron      History of Present Illness: Sean Leonard is a 75 y.o. male with history of adenomatous colon polyps, Barrett's esophagus, IDA in the setting of GI bleed in 2018, on oral iron since that time, and history of weight loss presenting today for follow-up.  Prior GI evaluation:  CTA July 2020: No mesenteric ischemia. TSH March 2021: Within normal limits. Labs in June 2021: CBC and CMP essentially normal. CT A/P with contrast 09/20/2019: No acute findings to explain weight loss. EGD 09/22/2019: Normal esophagus, no evidence of Barrett's epithelium, abnormal gastric mucosa of uncertain significance s/p biopsy, small hiatal hernia, normal examined duodenum.  Pathology revealed mild chronic gastritis, negative for H. Pylori. Colonoscopy 11/22/2019: Nonbleeding internal hemorrhoids, pancolonic diverticulosis.  No  recommendations to repeat due to age. GES 11/18/19: Normal.    Last seen in our office 03/03/2020.  He had gained 8 pounds over the last 4 months.  Remeron has been increased to 30 mg daily and he was eating better.  Continue with Ensure daily.  Sensation of early satiety resolved.  Denied any other significant GI symptoms. No overt GI bleeding. Continued on iron daily and omeprazole daily.  Plan to obtain labs from PCP for review.  Consider updating iron panel and discontinuing iron followed by monitoring.  Should iron and hemoglobin decline, would need to consider Givens capsule to ensure no small bowel etiology contributing to IDA and weight loss.  PCP did not have any recent labs on file.  We updated CBC and iron panel 05/10/2020.  Hemoglobin within normal limits at 13.7, iron 52, saturation 18% (L), ferritin 217.  Recommended stopping iron and monitoring.  Repeat CBC and iron panel 07/15/2020 with hemoglobin 13.9, iron 76, saturation 31%, ferritin 317.  Recommended continuing to hold iron.   Today:   Weights 160 lbs today per his scale at home. States this is about the same according to his scale. Eating fairly well. Not a great appetite.  No change since last visit. Eating 3 meals a day. Drinking 1 Protein shake daily- Ensure with 30 g of protein.  Due to expense, he is unable to afford drinking more than 1/day.  On 24-hour recall, states he had a sausage biscuit and gravy for breakfast yesterday, chicken wings and potato salad for lunch and dinner yesterday.  No nausea, vomiting, GERD symptoms, abdominal pain, constipation, diarrhea, brbpr, or  melena.   Max weight in the last couple of years 194 lbs on 04/29/2018 08/19/2018: 183 LBS 06/19/2019: 180 LBS 07/06/2019: 174 LBS 09/01/2019: 167 LBS 11/10/2019: 158 LBS 03/03/20: 166 LBS 11/29/20: 160 LBS per home scale.   Can come by tomorrow for weight check.  Past Medical History:  Diagnosis Date   DDD (degenerative disc disease), lumbar     Diverticula of colon    GERD (gastroesophageal reflux disease)    Hypothyroidism    Iliac artery aneurysm, bilateral (HCC)    PAD (peripheral artery disease) ([place])    Peptic ulcer    Prostatitis      Past Surgical History:  Procedure Laterality Date   BIOPSY  01/02/2017   Procedure: BIOPSY;  Surgeon: Daneil Dolin, MD;  [facility];  Service: Endoscopy;;  GASTRIC AND ESOPHAGEAL BIOPSIES   BIOPSY  09/14/2019   Procedure: BIOPSY;  Surgeon: Daneil Dolin, MD;  [facility];  Service: Endoscopy;;   COLONOSCOPY  April 2005   Dr. Irving Shows. Small internal hemorrhoids. Multiple small scattered diverticula in the cecum and descending colon.   COLONOSCOPY N/A 01/18/2014   Procedure: COLONOSCOPY;  Surgeon: Daneil Dolin, MD;  [facility];  Service: Endoscopy;  Laterality: N/A;  1100   COLONOSCOPY N/A 06/23/2016   Procedure: COLONOSCOPY;  Surgeon: Rogene Houston, MD;  [facility];  Service: Endoscopy;  Laterality: N/A;   COLONOSCOPY WITH ESOPHAGOGASTRODUODENOSCOPY (EGD) N/A 12/16/2012   Dr. Gala Romney: rectal and colonic polyps with inadequate preparation, tubular adenoma. EGD with biopsy proven short segment Barrett's esophagus, hiatal hernia, mild chronic inactive gastritis    COLONOSCOPY WITH PROPOFOL N/A 06/24/2016   Procedure: COLONOSCOPY WITH PROPOFOL;  Surgeon: Danie Binder, MD;  [facility];  Service: Endoscopy;  Laterality: N/A;   COLONOSCOPY WITH PROPOFOL N/A 06/28/2016   Procedure: COLONOSCOPY WITH PROPOFOL;  Surgeon: Mauri Pole, MD;  Location: [facility];  Service: Endoscopy;  Laterality: N/A;   COLONOSCOPY WITH PROPOFOL N/A 11/22/2019   Procedure: COLONOSCOPY WITH PROPOFOL;  Surgeon: Daneil Dolin, Nonbleeding internal hemorrhoids, pancolonic diverticulosis.  No recommendations to repeat due to age.   ESOPHAGOGASTRODUODENOSCOPY  July 2005   Dr. Irving Shows grade 2 esophagitis at the GE junction. Acute gastritis.  Multiple acute active, deep, irregular, friable ulcers in the antrum and body of the stomach. 2 active, deep, friable, irregular ulcers in the duodenum. Pathology not available.   ESOPHAGOGASTRODUODENOSCOPY N/A 01/18/2014   Procedure: ESOPHAGOGASTRODUODENOSCOPY (EGD);  Surgeon: Daneil Dolin, MD;  [facility];  Service: Endoscopy;  Laterality: N/A;   ESOPHAGOGASTRODUODENOSCOPY (EGD) WITH PROPOFOL N/A 01/02/2017   Rourk: Mucosal changes in the esophagus, undilated Z-line.  Small hiatal hernia.  Some snake skinning or fish scale appearance to the gastric mucosa.  Gastric biopsy with slight chronic inflammation, no H. pylori.  Esophageal biopsy with mild inflammation consistent with reflux.   ESOPHAGOGASTRODUODENOSCOPY (EGD) WITH PROPOFOL N/A 09/14/2019   Procedure: ESOPHAGOGASTRODUODENOSCOPY (EGD) WITH PROPOFOL;  Surgeon: Daneil Dolin, MD;  Normal esophagus, no evidence of Barrett's epithelium, abnormal gastric mucosa of uncertain significance s/p biopsy, small hiatal hernia, normal examined duodenum.  Pathology revealed mild chronic gastritis, negative for H. Pylori.   EXCISION ORAL TUMOR Left 08/19/2018   Procedure: EXCISION LEFT BUCCAL MASS;  Surgeon: Leta Baptist, MD;  Location: [place];  Service: ENT;  Laterality: Left;   IR ANGIOGRAM VISCERAL SELECTIVE  06/26/2016   IR ANGIOGRAM VISCERAL SELECTIVE  06/26/2016  IR ANGIOGRAM VISCERAL SELECTIVE  06/26/2016   IR US GUIDE VASC ACCESS RIGHT  06/26/2016   NOSE SURGERY     POLYPECTOMY  06/24/2016   Procedure: POLYPECTOMY;  Surgeon: Danie Binder, MD;  [facility];  Service: Endoscopy;;  cecal   THYROIDECTOMY     TONSILLECTOMY       Current Meds  Medication Sig   Cholecalciferol (VITAMIN D) 50 MCG (2000 UT) tablet Take 2,000 Units by mouth daily.   levothyroxine (SYNTHROID) 100 MCG tablet Take 100 mcg by mouth daily before breakfast.    mirtazapine (REMERON) 30 MG tablet Take 30 mg by mouth at bedtime.   Multiple  Vitamin (MULTIVITAMIN WITH MINERALS) TABS tablet Take 1 tablet by mouth daily.   Omega-3 Fatty Acids (FISH OIL PO) Take 2,400 mg by mouth daily.   omeprazole (PRILOSEC) 20 MG capsule TAKE 1 CAPSULE EVERY DAY (Patient taking differently: Take 20 mg by mouth daily before breakfast.)   pravastatin (PRAVACHOL) 20 MG tablet Take 20 mg by mouth daily.    sildenafil (VIAGRA) 100 MG tablet Take 100 mg by mouth daily as needed for erectile dysfunction.      Family History  Problem Relation Age of Onset   Hypertension Mother    Heart disease Mother        before age 9   Heart disease Father        before age 59   Hyperlipidemia Sister    Cancer Sister    Colon cancer Neg Hx    Liver disease Neg Hx     Social History   Socioeconomic History   Marital status: Single    Spouse name: Not on file   Number of children: Not on file   Years of education: Not on file   Highest education level: Not on file  Occupational History   Not on file  Tobacco Use   Smoking status: Former    Types: Cigars    Quit date: 11/27/2006    Years since quitting: 14.0   Smokeless tobacco: Never   Tobacco comments:    Quit in 2009  Vaping Use   Vaping Use: Never used  Substance and Sexual Activity   Alcohol use: Yes    Alcohol/week: 2.0 standard drinks    Types: 2 Cans of beer per week    Comment: occasioanal- none over the last year (02/2020)   Drug use: Yes    Frequency: 5.0 times per week    Types: Marijuana    Comment: occ   Sexual activity: Not on file  Other Topics Concern   Not on file  Social History Narrative   1 deceased child. 1 living   Social Determinants of Health   Financial Resource Strain: Not on file  Food Insecurity: Not on file  Transportation Needs: Not on file  Physical Activity: Not on file  Stress: Not on file  Social Connections: Not on file       Review of Systems: Gen: Denies fever, chills, cold or flulike symptoms, presyncope, syncope. CV: Denies chest pain,  palpitations.  Resp: Denies dyspnea or cough. GI: see HPI Heme: See HPI  Observations/Objective: No distress. Alert and oriented. Pleasant. Unable to perform complete physical exam due to telephone encounter. No video available.    Assessment and Plan: 75 year old male with history of adenomatous colon polyps, Barrett's esophagus, IDA in the setting of GI bleed in 2018, and history of weight loss presenting today for follow-up of weight loss and  anemia.  Weight loss: Total of 23 pound weight loss over the last couple of years.  Initially lost 16 pounds between 2020-2021.  Since that time, he has been fluctuating in the 160s.  At his last visit in December 2021, he had actually gained 8 pounds and was weighing 166 lbs. Patient reports his weight was 160 lbs at home today.  It is unclear if this is secondary to scale calibration differences between his home scale and our office.  Overall, patient reports his weight has remained fairly stable since his last office visit.  He does not have a great appetite, but this is chronic and unchanged.  He is eating 3 meals a day and drinking 1 Ensure daily.  Denies any significant GI symptoms.  Etiology of weight loss has been unclear.  He has undergone an extensive GI work-up thus far detailed in HPI including CT A/P with contrast, CTA, CBC, CMP, TSH, EGD, colonoscopy, gastric emptying study all unrevealing.  I requested patient to come to the office for a weight check tomorrow.  If weight has declined more than 5 pounds since his last office visit in December 2021, would need to consider additional evaluation with CT chest and updating TSH as his weight loss doesn't seem to be secondary to a GI issue.  Encouraged to continue drinking 1-2 protein shakes daily as cost allows.  Recommended buying protein powder and creating protein shakes himself to help with cost.  Also encouraged 1-2 snacks daily.  Anemia: History of iron deficiency anemia in the setting of GI  bleed in 2018.  He was on iron for quite some time, but this was discontinued in February 2022 as hemoglobin was 13.7 and iron panel within normal limits.  Repeat labs in April 2022 with hemoglobin 13.9, ferritin 317, saturation 31%, iron 76.  He denies overt GI bleeding.  Recommended continuing to hold iron, avoid NSAIDs, and updating labs in October for 6 month recheck.  Follow Up Instructions: Plan to follow-up in 6 months or sooner if needed.   I discussed the assessment and treatment plan with the patient. The patient was provided an opportunity to ask questions and all were answered. The patient agreed with the plan and demonstrated an understanding of the instructions.   The patient was advised to call back or seek an in-person evaluation if the symptoms worsen or if the condition fails to improve as anticipated.  I provided 12 minutes of non-face-to-face time during this encounter.  Aliene Altes, PA-C [facility]   11/29/2020

## 2020-11-29 ENCOUNTER — Encounter: Payer: Self-pay | Admitting: Gastroenterology

## 2020-11-29 ENCOUNTER — Ambulatory Visit (INDEPENDENT_AMBULATORY_CARE_PROVIDER_SITE_OTHER): Payer: Medicare Other | Admitting: Gastroenterology

## 2020-11-29 ENCOUNTER — Telehealth: Payer: Self-pay | Admitting: Gastroenterology

## 2020-11-29 ENCOUNTER — Encounter: Payer: Self-pay | Admitting: Internal Medicine

## 2020-11-29 ENCOUNTER — Other Ambulatory Visit: Payer: Self-pay

## 2020-11-29 ENCOUNTER — Telehealth: Payer: Self-pay | Admitting: *Deleted

## 2020-11-29 VITALS — Wt 154.6 lb

## 2020-11-29 DIAGNOSIS — D509 Iron deficiency anemia, unspecified: Secondary | ICD-10-CM | POA: Diagnosis not present

## 2020-11-29 DIAGNOSIS — R634 Abnormal weight loss: Secondary | ICD-10-CM

## 2020-11-29 NOTE — Telephone Encounter (Signed)
CT chest scheduled for 10/4 at 4:00pm, arrival 3:45pm, liquids only 4 hrs prior Called pt and is aware of appt details.

## 2020-11-29 NOTE — Telephone Encounter (Signed)
Pt consented to a telephone visit. °

## 2020-11-29 NOTE — Telephone Encounter (Signed)
Patient came by the office today for weight check after telemedicine visit.  Weight was 154 lbs.  This is down from 166 lbs in December 2021.  As recommended at the time of his visit, he needs additional evaluation with CT chest with contrast and updated labs as he has had extensive GI evaluation without identified etiology of weight loss.  [facility] clinical pool:  Please arrange CT chest with contrast.  He will need updated creatinine/GFR. Dx:   Unintentional weight loss. I have also placed orders for TSH, CBC, and CMP to be completed at [facility].  Please mail orders to patient.

## 2020-11-29 NOTE — Telephone Encounter (Signed)
Orders have been placed in the mail to the patient.

## 2020-11-29 NOTE — Addendum Note (Signed)
Addended by: Cheron Every on: 11/29/2020 01:49 PM   Modules accepted: Orders

## 2020-11-29 NOTE — Patient Instructions (Addendum)
Please come by our office on 9/8 for a weight check.  I will have further recommendations for you after this.  We will arrange for you to have labs in 4 weeks to recheck your hemoglobin.  Continue eating 3 meals daily and try adding 1-2 snacks daily.  Continue drinking 1-2 protein shakes daily to help maintain your weight. Due to cost, I recommend you try buying over-the-counter protein powder and mixing with milk or ice cream or fruit to make a shake.  Continue omeprazole 20 mg daily.  We will plan to see you back in 6 months.  Do not hesitate to call if you have any questions or concerns prior to your next visit.  It was great talking with you today!  Aliene Altes, PA-C [facility]

## 2020-12-26 ENCOUNTER — Ambulatory Visit (HOSPITAL_COMMUNITY): Admission: RE | Admit: 2020-12-26 | Payer: Medicare Other | Source: Ambulatory Visit

## 2021-01-12 ENCOUNTER — Other Ambulatory Visit (HOSPITAL_COMMUNITY)
Admission: RE | Admit: 2021-01-12 | Discharge: 2021-01-12 | Disposition: A | Discharge status: Home / Self Care | Payer: Medicare Other | Source: Ambulatory Visit | Attending: Gastroenterology | Admitting: Gastroenterology

## 2021-01-12 ENCOUNTER — Other Ambulatory Visit: Payer: Self-pay

## 2021-01-12 DIAGNOSIS — R634 Abnormal weight loss: Secondary | ICD-10-CM | POA: Diagnosis present

## 2021-01-12 DIAGNOSIS — D509 Iron deficiency anemia, unspecified: Secondary | ICD-10-CM | POA: Insufficient documentation

## 2021-01-12 LAB — CBC WITH DIFFERENTIAL/PLATELET
Abs Immature Granulocytes: 0.04 10*3/uL (ref 0.00–0.07)
Basophils Absolute: 0 10*3/uL (ref 0.0–0.1)
Basophils Relative: 0 %
Eosinophils Absolute: 0 10*3/uL (ref 0.0–0.5)
Eosinophils Relative: 0 %
HCT: 46.8 % (ref 39.0–52.0)
Hemoglobin: 14.9 g/dL (ref 13.0–17.0)
Immature Granulocytes: 0 %
Lymphocytes Relative: 26 %
Lymphs Abs: 2.6 10*3/uL (ref 0.7–4.0)
MCH: 29.7 pg (ref 26.0–34.0)
MCHC: 31.8 g/dL (ref 30.0–36.0)
MCV: 93.2 fL (ref 80.0–100.0)
Monocytes Absolute: 1.5 10*3/uL — ABNORMAL HIGH (ref 0.1–1.0)
Monocytes Relative: 15 %
Neutro Abs: 5.7 10*3/uL (ref 1.7–7.7)
Neutrophils Relative %: 59 %
Platelets: 244 10*3/uL (ref 150–400)
RBC: 5.02 MIL/uL (ref 4.22–5.81)
RDW: 14.6 % (ref 11.5–15.5)
WBC: 9.9 10*3/uL (ref 4.0–10.5)
nRBC: 0 % (ref 0.0–0.2)

## 2021-01-15 ENCOUNTER — Other Ambulatory Visit: Payer: Self-pay | Admitting: *Deleted

## 2021-01-15 ENCOUNTER — Other Ambulatory Visit: Payer: Self-pay

## 2021-01-15 DIAGNOSIS — R634 Abnormal weight loss: Secondary | ICD-10-CM

## 2021-01-15 NOTE — Progress Notes (Signed)
E 

## 2021-01-19 LAB — COMPLETE METABOLIC PANEL WITH GFR
AG Ratio: 1.5 (calc) (ref 1.0–2.5)
ALT: 8 U/L — ABNORMAL LOW (ref 9–46)
AST: 11 U/L (ref 10–35)
Albumin: 3.8 g/dL (ref 3.6–5.1)
Alkaline phosphatase (APISO): 61 U/L (ref 35–144)
BUN: 12 mg/dL (ref 7–25)
CO2: 29 mmol/L (ref 20–32)
Calcium: 9.3 mg/dL (ref 8.6–10.3)
Chloride: 105 mmol/L (ref 98–110)
Creat: 0.8 mg/dL (ref 0.70–1.28)
Globulin: 2.5 g/dL (calc) (ref 1.9–3.7)
Glucose, Bld: 102 mg/dL (ref 65–139)
Potassium: 3.8 mmol/L (ref 3.5–5.3)
Sodium: 142 mmol/L (ref 135–146)
Total Bilirubin: 0.4 mg/dL (ref 0.2–1.2)
Total Protein: 6.3 g/dL (ref 6.1–8.1)
eGFR: 93 mL/min/{1.73_m2} (ref >=60)

## 2021-01-19 LAB — TSH: TSH: 0.28 mIU/L — ABNORMAL LOW (ref 0.40–4.50)

## 2021-01-23 ENCOUNTER — Telehealth: Payer: Self-pay | Admitting: Internal Medicine

## 2021-01-23 NOTE — Telephone Encounter (Signed)
Routing to [place], who last saw patient.

## 2021-01-23 NOTE — Telephone Encounter (Signed)
Noted. Please let me know if there is anything I need to do.

## 2021-01-23 NOTE — Telephone Encounter (Signed)
PATIENT CALLED AND ASKED IF HE COULD HAVE HIS CT DONE FASTER IF HE WENT TO [place], THEY CAN NOT SEE HIM SOONER AT  AND HE IS CONCERNED ABOUT HIS HEALTH, SAID HE CAN NOT KEEP LOSING WEIGHT LIKE HE IS

## 2021-01-23 NOTE — Telephone Encounter (Signed)
Called pt. He stated he called central scheduling to see if he could have CT done in [place] and they told him they would have to call him back. I advised him they are the ones that does the scheduling for [facility]. Advised if he did not hear from them to call us back

## 2021-01-23 NOTE — Telephone Encounter (Signed)
Patient is awaiting to hear from central scheduling if he can be scheduled sooner in 

## 2021-01-24 NOTE — Telephone Encounter (Signed)
Spoke with central scheduling as he has not heard from anyone.   CT now scheduled 11/9 at 1:30pm, arrival 1:15pm, liquids only 4 hrs prior. [facility] location in [place]. [place]   Called pt and he is aware of appt details. He voiced understanding.

## 2021-01-26 ENCOUNTER — Encounter: Payer: Self-pay | Admitting: Gastroenterology

## 2021-01-26 ENCOUNTER — Other Ambulatory Visit: Payer: Self-pay

## 2021-01-26 ENCOUNTER — Ambulatory Visit (INDEPENDENT_AMBULATORY_CARE_PROVIDER_SITE_OTHER): Payer: Medicare Other | Admitting: Gastroenterology

## 2021-01-26 VITALS — BP 140/88 | HR 103 | Temp 97.7°F | Ht 74.0 in | Wt 143.0 lb

## 2021-01-26 DIAGNOSIS — R6881 Early satiety: Secondary | ICD-10-CM | POA: Diagnosis not present

## 2021-01-26 DIAGNOSIS — R634 Abnormal weight loss: Secondary | ICD-10-CM

## 2021-01-26 NOTE — Patient Instructions (Signed)
We will be in touch with CT results as available.  Please try to eat a few bites of food every 2 hours even if you do not feel hungry.  Continue Ensure once daily.

## 2021-01-26 NOTE — Progress Notes (Signed)
Primary Care Physician: Lavella Lemons, [place]  Primary Gastroenterologist:  Garfield Cornea, MD   Chief Complaint  Patient presents with   Weight Loss    Not able to eat much, gets full   Anemia    HPI: Sean Leonard is a 75 y.o. male here for follow-up.  He had a virtual visit back in September with history of adenomatous colon polyps, Barrett's esophagus, IDA in the setting of GI bleeding in 2018 on oral iron since that time, weight loss.  At time of last visit patient reported weighing 160 pounds and having diminished appetite, early satiety.  Denied any significant change over the past year however.  Drinking 1 Ensure daily, eating 3 meals.  Unable to afford drinking more than 1 Ensure daily.  Saw Dr. Pleas Koch this week. Has MRI brain pending for memory issues.   Appetite is poor.  24-hour recall: Egg/sausage in the morning. Some OJ. 3-4 bites and full. Very little for lunch. Little bit of chicken last night. Doing Ensure one per day, can't afford two per day. BMs small. Sometimes can be hard. No melena, brbpr. No N/V. No heartburn. Sometimes vague dysphagia if tries to swallow too much at a time.  Denies abdominal pain or cough.  No shortness of breath or chest pain.  Complains of fatigue.  Has lost 40 pounds since May 2020, 17 pounds in the past 2 months.  Recently found to have low TSH, PCP adjusted Synthroid dose this week.   Max weight in the last couple of years 194 lbs on 04/29/2018 08/19/2018: 183 LBS 06/19/2019: 180 LBS 07/06/2019: 174 LBS 09/01/2019: 167 LBS 11/10/2019: 158 LBS 03/03/20: 166 LBS 11/29/20: 160 LBS per home scale.  This week at home: 142 LBS 01/26/21: 143 LBS   Prior GI evaluation:  CTA July 2020: No mesenteric ischemia. TSH March 2021: Within normal limits. TSH 12/2020: 0.28 Labs in June 2021: CBC and CMP essentially normal. CT A/P with contrast 09/20/2019: No acute findings to explain weight loss. EGD 09/22/2019: Normal esophagus, no evidence of  Barrett's epithelium, abnormal gastric mucosa of uncertain significance s/p biopsy, small hiatal hernia, normal examined duodenum.  Pathology revealed mild chronic gastritis, negative for H. Pylori. Colonoscopy 11/22/2019: Nonbleeding internal hemorrhoids, pancolonic diverticulosis.  No recommendations to repeat due to age. GES 11/18/19: Normal.   Current Outpatient Medications  Medication Sig Dispense Refill   Cholecalciferol (VITAMIN D) 50 MCG (2000 UT) tablet Take 2,000 Units by mouth daily.     levothyroxine (SYNTHROID) 88 MCG tablet Take 88 mcg by mouth daily.     mirtazapine (REMERON) 30 MG tablet Take 30 mg by mouth at bedtime.     Multiple Vitamin (MULTIVITAMIN WITH MINERALS) TABS tablet Take 1 tablet by mouth daily.     Omega-3 Fatty Acids (FISH OIL PO) Take 2,400 mg by mouth daily.     omeprazole (PRILOSEC) 20 MG capsule TAKE 1 CAPSULE EVERY DAY (Patient taking differently: Take 20 mg by mouth daily before breakfast.) 90 capsule 0   pravastatin (PRAVACHOL) 20 MG tablet Take 20 mg by mouth daily.      sildenafil (VIAGRA) 100 MG tablet Take 100 mg by mouth daily as needed for erectile dysfunction.      No current facility-administered medications for this visit.    Allergies as of 01/26/2021   (No Known Allergies)    ROS:  General: Negative for  fever, chills,  weakness.  See HPI ENT: Negative for hoarseness, nasal congestion.  See HPI CV: Negative for chest pain, angina, palpitations, dyspnea on exertion, peripheral edema.  Respiratory: Negative for dyspnea at rest, dyspnea on exertion, cough, sputum, wheezing.  GI: See history of present illness. GU:  Negative for dysuria, hematuria, urinary incontinence, urinary frequency, nocturnal urination.  Endo: See HPI   Physical Examination:   BP 140/88   Pulse (!) 103   Temp 97.7 F (36.5 C) (Temporal)   Ht 6\' 2"  (1.88 m)   Wt 143 lb (64.9 kg)   BMI 18.36 kg/m   General: Well-nourished, well-developed in no acute distress.   Eyes: No icterus. Mouth: masked Lungs: Clear to auscultation bilaterally.  Heart: Regular rate and rhythm, no murmurs rubs or gallops.  Abdomen: Bowel sounds are normal, nontender, nondistended, no hepatosplenomegaly or masses, no abdominal bruits or hernia , no rebound or guarding.   Extremities: No lower extremity edema. No clubbing or deformities. Neuro: Alert and oriented x 4   Skin: Warm and dry, no jaundice.   Psych: Alert and cooperative, normal mood and affect.  Labs:  Lab Results  Component Value Date   CREATININE 0.80 01/18/2021   BUN 12 01/18/2021   NA 142 01/18/2021   K 3.8 01/18/2021   CL 105 01/18/2021   CO2 29 01/18/2021   Lab Results  Component Value Date   ALT 8 (L) 01/18/2021   AST 11 01/18/2021   ALKPHOS 69 06/19/2019   BILITOT 0.4 01/18/2021   Lab Results  Component Value Date   WBC 9.9 01/12/2021   HGB 14.9 01/12/2021   HCT 46.8 01/12/2021   MCV 93.2 01/12/2021   PLT 244 01/12/2021    Lab Results  Component Value Date   TSH 0.28 (L) 01/18/2021    Imaging Studies: No results found.   Assessment:  Weight loss: Down 17 more pounds in the past 2 months.  Complains of significant early satiety, poor appetite.  He has fatigue.  Otherwise no symptoms.  24-hour recall with limited caloric intake.  He is scheduled for CT scan next week to investigate his weight loss.  Unfortunately his procedure was canceled last month for unclear reasons, not on the patient's part.  Extensive evaluation as outlined above so far.  Anemia: History of IDA in the setting of GI bleeding in 2018.  Hemoglobin last month was 14.9.  No overt GI bleeding.   Plan: CT abdomen pelvis with contrast as scheduled. Encourage patient to eat every 2 hours even if only a few bites at a time.  Continue to drink Ensure at least once daily, twice if he can afford. Continue omeprazole 20 mg daily. Complete MRI brain as ordered by PCP for memory issues.

## 2021-01-31 ENCOUNTER — Ambulatory Visit (HOSPITAL_BASED_OUTPATIENT_CLINIC_OR_DEPARTMENT_OTHER)
Admission: RE | Admit: 2021-01-31 | Discharge: 2021-01-31 | Disposition: A | Discharge status: Home / Self Care | Payer: Medicare Other | Source: Ambulatory Visit | Attending: Gastroenterology | Admitting: Gastroenterology

## 2021-01-31 ENCOUNTER — Other Ambulatory Visit: Payer: Self-pay

## 2021-01-31 DIAGNOSIS — I700 Atherosclerosis of aorta: Secondary | ICD-10-CM | POA: Diagnosis not present

## 2021-01-31 DIAGNOSIS — R634 Abnormal weight loss: Secondary | ICD-10-CM | POA: Diagnosis present

## 2021-01-31 MED ORDER — IOHEXOL 300 MG/ML  SOLN
75.0000 mL | Freq: Once | INTRAMUSCULAR | Status: AC | PRN
  Administered 2021-01-31: 75 mL via INTRAVENOUS

## 2021-02-06 ENCOUNTER — Encounter: Payer: Self-pay | Admitting: *Deleted

## 2021-02-07 ENCOUNTER — Other Ambulatory Visit: Payer: Self-pay | Admitting: *Deleted

## 2021-02-07 DIAGNOSIS — R6881 Early satiety: Secondary | ICD-10-CM

## 2021-02-07 DIAGNOSIS — R634 Abnormal weight loss: Secondary | ICD-10-CM

## 2021-02-07 NOTE — Progress Notes (Signed)
Spoke with pt.  Informed him of results and recommendations.  Pt was informed that he needs fasting AM cortisol level between 8-10 am.  He was advised nothing to eat or drink after midnight.  He said that he would go to [facility] on Monday after Thanksgiving to have completed.  Pt was informed that we would like to arrange dietician referral to assess appropriate calorie intake and ways to optimize nutrition to avoid further weight loss.  Pt informed that he will need future follow up with Dr. Gala Romney.   [facility] Clinical Pool:  Can you arrange dietician referral please?  Erline Levine [place]:  Can you arrange ov with Dr. Gala Romney 1 month when we get a cancellation please?

## 2021-02-09 ENCOUNTER — Telehealth: Payer: Self-pay | Admitting: Internal Medicine

## 2021-02-09 NOTE — Telephone Encounter (Signed)
Thanks

## 2021-02-09 NOTE — Telephone Encounter (Signed)
Dr. Gala Romney had a cancellation and he is coming in 12/13

## 2021-02-14 ENCOUNTER — Ambulatory Visit (HOSPITAL_COMMUNITY): Payer: Medicare Other

## 2021-03-05 ENCOUNTER — Telehealth: Payer: Self-pay

## 2021-03-05 NOTE — Telephone Encounter (Signed)
Pt cancelled his appt with Dr. Gala Romney on 03/06/2021. Pt will need to be put on recall list for after 03/25/21.

## 2021-03-06 ENCOUNTER — Ambulatory Visit: Payer: Medicare Other | Admitting: Internal Medicine

## 2021-03-21 ENCOUNTER — Ambulatory Visit: Payer: Medicare Other | Admitting: Nutrition

## 2021-03-27 DIAGNOSIS — F1210 Cannabis abuse, uncomplicated: Secondary | ICD-10-CM | POA: Diagnosis not present

## 2021-03-27 DIAGNOSIS — Z6820 Body mass index (BMI) 20.0-20.9, adult: Secondary | ICD-10-CM | POA: Diagnosis not present

## 2021-03-27 DIAGNOSIS — E039 Hypothyroidism, unspecified: Secondary | ICD-10-CM | POA: Diagnosis not present

## 2021-03-27 DIAGNOSIS — D649 Anemia, unspecified: Secondary | ICD-10-CM | POA: Diagnosis not present

## 2021-03-27 DIAGNOSIS — F17210 Nicotine dependence, cigarettes, uncomplicated: Secondary | ICD-10-CM | POA: Diagnosis not present

## 2021-03-27 DIAGNOSIS — D519 Vitamin B12 deficiency anemia, unspecified: Secondary | ICD-10-CM | POA: Diagnosis not present

## 2021-03-27 DIAGNOSIS — R634 Abnormal weight loss: Secondary | ICD-10-CM | POA: Diagnosis not present

## 2021-03-27 DIAGNOSIS — Z23 Encounter for immunization: Secondary | ICD-10-CM | POA: Diagnosis not present

## 2021-04-24 ENCOUNTER — Encounter: Payer: Self-pay | Admitting: Internal Medicine

## 2021-04-24 ENCOUNTER — Ambulatory Visit: Payer: Medicare Other | Admitting: Nutrition

## 2021-05-09 DIAGNOSIS — H2513 Age-related nuclear cataract, bilateral: Secondary | ICD-10-CM | POA: Diagnosis not present

## 2021-05-09 DIAGNOSIS — H524 Presbyopia: Secondary | ICD-10-CM | POA: Diagnosis not present

## 2021-05-09 DIAGNOSIS — H52223 Regular astigmatism, bilateral: Secondary | ICD-10-CM | POA: Diagnosis not present

## 2021-05-14 ENCOUNTER — Other Ambulatory Visit: Payer: Self-pay

## 2021-05-14 DIAGNOSIS — I723 Aneurysm of iliac artery: Secondary | ICD-10-CM

## 2021-05-31 ENCOUNTER — Ambulatory Visit: Payer: Medicare Other | Admitting: Gastroenterology

## 2021-05-31 ENCOUNTER — Ambulatory Visit (HOSPITAL_COMMUNITY): Admission: RE | Admit: 2021-05-31 | Payer: Medicare HMO | Source: Ambulatory Visit

## 2021-06-06 ENCOUNTER — Ambulatory Visit: Payer: Medicare HMO | Admitting: Vascular Surgery

## 2021-06-13 ENCOUNTER — Ambulatory Visit: Payer: Medicare HMO | Admitting: Vascular Surgery

## 2021-06-14 ENCOUNTER — Ambulatory Visit: Payer: Medicare HMO | Admitting: Nutrition

## 2021-06-20 ENCOUNTER — Encounter: Payer: Self-pay | Admitting: Vascular Surgery

## 2021-06-20 ENCOUNTER — Other Ambulatory Visit: Payer: Self-pay

## 2021-06-20 ENCOUNTER — Ambulatory Visit: Payer: Medicare HMO | Admitting: Vascular Surgery

## 2021-06-20 VITALS — BP 143/93 | HR 114 | Ht 74.0 in

## 2021-06-20 DIAGNOSIS — I723 Aneurysm of iliac artery: Secondary | ICD-10-CM

## 2021-06-20 NOTE — Progress Notes (Signed)
? ? [facility] ? ?Patient name: Sean Leonard MRN: [id number] DOB: [birth year] Sex: male ? ?REASON FOR VISIT: Follow-up external and internal iliac artery aneurysms ? ?HPI: ?Sean Leonard is a 76 y.o. male who today for follow-up.  He is in good health.  He denies any claudication symptoms and has no symptoms referable to his aneurysm.  He was to have a 2-year CT follow-up of his abdomen and pelvis and is not accomplished. ? ?Past Medical History:  ?Diagnosis Date  ? DDD (degenerative disc disease), lumbar   ? Diverticula of colon   ? GERD (gastroesophageal reflux disease)   ? Hypothyroidism   ? Iliac artery aneurysm, bilateral ([place])   ? PAD (peripheral artery disease) ([place])   ? Peptic ulcer   ? Prostatitis   ? ? ?Family History  ?Problem Relation Age of Onset  ? Hypertension Mother   ? Heart disease Mother   ?     before age 41  ? Heart disease Father   ?     before age 65  ? Hyperlipidemia Sister   ? Cancer Sister   ? Colon cancer Neg Hx   ? Liver disease Neg Hx   ? ? ?SOCIAL HISTORY: ?Social History  ? ?Tobacco Use  ? Smoking status: Former  ?  Types: Cigars  ?  Quit date: 11/27/2006  ?  Years since quitting: 14.5  ? Smokeless tobacco: Never  ? Tobacco comments:  ?  Quit in 2009  ?Substance Use Topics  ? Alcohol use: Yes  ?  Alcohol/week: 2.0 standard drinks  ?  Types: 2 Cans of beer per week  ?  Comment: occ  ? ? ?No Known Allergies ? ?Current Outpatient Medications  ?Medication Sig Dispense Refill  ? Cholecalciferol (VITAMIN D) 50 MCG (2000 UT) tablet Take 2,000 Units by mouth daily.    ? levothyroxine (SYNTHROID) 88 MCG tablet Take 88 mcg by mouth daily.    ? mirtazapine (REMERON) 30 MG tablet Take 30 mg by mouth at bedtime.    ? Multiple Vitamin (MULTIVITAMIN WITH MINERALS) TABS tablet Take 1 tablet by mouth daily.    ? Omega-3 Fatty Acids (FISH OIL PO) Take 2,400 mg by mouth daily.    ? omeprazole (PRILOSEC) 20 MG capsule TAKE 1 CAPSULE  EVERY DAY (Patient taking differently: Take 20 mg by mouth daily before breakfast.) 90 capsule 0  ? pravastatin (PRAVACHOL) 20 MG tablet Take 20 mg by mouth daily.     ? sildenafil (VIAGRA) 100 MG tablet Take 100 mg by mouth daily as needed for erectile dysfunction.     ? ?No current facility-administered medications for this visit.  ? ? ?REVIEW OF SYSTEMS:  ?'[X]'$  denotes positive finding, '[ ]'$  denotes negative finding ?Cardiac  Comments:  ?Chest pain or chest pressure:    ?Shortness of breath upon exertion:    ?Short of breath when lying flat:    ?Irregular heart rhythm:    ?    ?Vascular    ?Pain in calf, thigh, or hip brought on by ambulation:    ?Pain in feet at night that wakes you up from your sleep:     ?Blood clot in your veins:    ?Leg swelling:     ?    ? ? ?PHYSICAL EXAM: ?Vitals:  ? 06/20/21 1138  ?BP: (!) 143/93  ?Pulse: (!) 114  ?Height: '6\' 2"'$  (1.88 m)  ? ? ?GENERAL: The patient is a well-nourished male, in  no acute distress. The vital signs are documented above. ?CARDIOVASCULAR: 2+ radial 2+ femoral 2+ popliteal and 2+ dorsalis pedis pulses with no evidence of peripheral aneurysm.  Abdomen soft with no aneurysm palpable ?PULMONARY: There is good air exchange  ?MUSCULOSKELETAL: There are no major deformities or cyanosis. ?NEUROLOGIC: No focal weakness or paresthesias are detected. ?SKIN: There are no ulcers or rashes noted. ?PSYCHIATRIC: The patient has a normal affect. ? ?DATA:  ?None ? ?MEDICAL ISSUES: ?Explained the need for a CT follow-up to the patient.  We will obtain this as an outpatient at [facility] in the next several weeks at his convenience and we will notify him of the following.  He does have history of bilateral common iliac and internal iliac artery aneurysms that were not of the size that would require surgical intervention his last CT scan in June 2021 ? ? ? ?Rosetta Posner, MD FACS ?[facility] ?Office Tel [phone] ? ?Note: Portions of this report  may have been transcribed using voice recognition software.  Every effort has been made to ensure accuracy; however, inadvertent computerized transcription errors may still be present. ?

## 2021-07-04 ENCOUNTER — Ambulatory Visit: Payer: Medicare HMO | Admitting: Vascular Surgery

## 2021-07-04 DIAGNOSIS — Z0001 Encounter for general adult medical examination with abnormal findings: Secondary | ICD-10-CM | POA: Diagnosis not present

## 2021-07-04 DIAGNOSIS — Z1331 Encounter for screening for depression: Secondary | ICD-10-CM | POA: Diagnosis not present

## 2021-07-04 DIAGNOSIS — E039 Hypothyroidism, unspecified: Secondary | ICD-10-CM | POA: Diagnosis not present

## 2021-07-04 DIAGNOSIS — E782 Mixed hyperlipidemia: Secondary | ICD-10-CM | POA: Diagnosis not present

## 2021-07-04 DIAGNOSIS — M7989 Other specified soft tissue disorders: Secondary | ICD-10-CM | POA: Diagnosis not present

## 2021-07-04 DIAGNOSIS — Z1389 Encounter for screening for other disorder: Secondary | ICD-10-CM | POA: Diagnosis not present

## 2021-07-04 DIAGNOSIS — S9032XA Contusion of left foot, initial encounter: Secondary | ICD-10-CM | POA: Diagnosis not present

## 2021-07-04 DIAGNOSIS — Z6821 Body mass index (BMI) 21.0-21.9, adult: Secondary | ICD-10-CM | POA: Diagnosis not present

## 2021-07-04 DIAGNOSIS — M25569 Pain in unspecified knee: Secondary | ICD-10-CM | POA: Diagnosis not present

## 2021-07-04 DIAGNOSIS — Q892 Congenital malformations of other endocrine glands: Secondary | ICD-10-CM | POA: Diagnosis not present

## 2021-07-04 DIAGNOSIS — Z23 Encounter for immunization: Secondary | ICD-10-CM | POA: Diagnosis not present

## 2021-07-04 DIAGNOSIS — M25562 Pain in left knee: Secondary | ICD-10-CM | POA: Diagnosis not present

## 2021-07-04 DIAGNOSIS — E890 Postprocedural hypothyroidism: Secondary | ICD-10-CM | POA: Diagnosis not present

## 2021-07-11 ENCOUNTER — Encounter: Payer: Medicare HMO | Attending: Physician Assistant | Admitting: Nutrition

## 2021-07-11 ENCOUNTER — Encounter: Payer: Self-pay | Admitting: Nutrition

## 2021-07-11 ENCOUNTER — Telehealth: Payer: Self-pay | Admitting: Nutrition

## 2021-07-11 VITALS — Ht 74.0 in | Wt 158.0 lb

## 2021-07-11 DIAGNOSIS — R634 Abnormal weight loss: Secondary | ICD-10-CM | POA: Insufficient documentation

## 2021-07-11 DIAGNOSIS — R636 Underweight: Secondary | ICD-10-CM | POA: Insufficient documentation

## 2021-07-11 DIAGNOSIS — R630 Anorexia: Secondary | ICD-10-CM | POA: Insufficient documentation

## 2021-07-11 NOTE — Patient Instructions (Signed)
Goals ? ?Eat three meals per day at times discussed ?Don't skip meals ?Drink 5 bottles per week of water ?Increase snacks to 1 between each meal ?Gain 2-3 lbs per month ?Cut out sodas, juice and sugary foods and liquids and eat more foods from a garden. ? ?

## 2021-07-11 NOTE — Progress Notes (Signed)
Medical Nutrition Therapy  ?Appointment Start time:  1300  Appointment End time:  1400 ? ?Primary concerns today: Weight loss  ?Referral diagnosis: R63.4 ?Preferred learning style: Read  ?Learning readiness: Ready  ? ? ?NUTRITION ASSESSMENT  ?Concerned about losing weight. Doesn't have an appetite much. ? Has lost 30+ lbs in he last 3 years. ?Dr. Pleas Koch Reduced Synthrod to 88 mcg. ? ?Eats 2 meals per day. Has been trying to eat some snacks between meals. Skips lunch usually since he doesn't eat breakfast til later. ? ?Current diet is insuffient to meet his calorie and nutrient needs for normal weight for his height. ? ?Willing to work on increasing calories. Has had recent changes to thyroid medication. ? ? ? ?Anthropometrics  ?Wt Readings from Last 3 Encounters:  ?01/26/21 143 lb (64.9 kg)  ?11/29/20 154 lb 9.6 oz (70.1 kg)  ?03/03/20 166 lb (75.3 kg)  ? ?Ht Readings from Last 3 Encounters:  ?06/20/21 '6\' 2"'$  (1.88 m)  ?01/26/21 '6\' 2"'$  (1.88 m)  ?03/03/20 '6\' 2"'$  (1.88 m)  ? ?There is no height or weight on file to calculate BMI. ?'@BMIFA'$ @ ?Facility age limit for growth percentiles is 20 years. ?Facility age limit for growth percentiles is 20 years. ? ?Clinical ?Medical Hx: see chart ?Medications: See chart ?Labs: No results found for: HGBA1C ? ?  Latest Ref Rng & Units 07/12/2021  ?  9:51 AM 01/18/2021  ?  2:44 PM 09/07/2019  ? 11:56 AM  ?CMP  ?Glucose 65 - 139 mg/dL  102   103    ?BUN 7 - 25 mg/dL  12   20    ?Creatinine 0.61 - 1.24 mg/dL 1.00   0.80   0.92    ?Sodium 135 - 146 mmol/L  142   138    ?Potassium 3.5 - 5.3 mmol/L  3.8   4.3    ?Chloride 98 - 110 mmol/L  105   100    ?CO2 20 - 32 mmol/L  29   30    ?Calcium 8.6 - 10.3 mg/dL  9.3   9.7    ?Total Protein 6.1 - 8.1 g/dL  6.3   7.0    ?Total Bilirubin 0.2 - 1.2 mg/dL  0.4   0.4    ?AST 10 - 35 U/L  11   15    ?ALT 9 - 46 U/L  8   10    ? ?Lipid Panel  ?   ?Component Value Date/Time  ? CHOL 99 06/24/2016 0423  ? TRIG 175 (H) 06/24/2016 0423  ? HDL 15 (L)  06/24/2016 0423  ? CHOLHDL 6.6 06/24/2016 0423  ? VLDL 35 06/24/2016 0423  ? [place] 49 06/24/2016 0423  ? ? ?Notable Signs/Symptoms: none ? ?Lifestyle & Dietary Hx ?LIves with his wife. Retired 30+ yrs. ?Eats 2 meals per day. Doesn't have an appetite. Been eating smaller, more frequent meals. ?Plays some golf. ? ?Estimated daily fluid intake: 30 oz ?Supplements: Omega fish oil, , Vit D ?Sleep: good ?Stress / self-care: none ?Current average weekly physical activity: works in yard. ? ?24-Hr Dietary Recall ?First Meal:  4 slices of bacon, Potato soup homemade 1-2 cups, OJ 16 oz ?Snack:  ?Second Meal: skips not hungry ?Snack: mm 's peanuts, 16 oz of strawberry lemonade sodas ?Third Meal:  630 Pork chop and gravy, potato soup,  strawberry lemonade ?Snack: none ?Beverages:  2 bottles of water, sodas and juice.  ? ?Estimated Energy Needs ?Calories: 2000-2200 ?Carbohydrate: 225-235g ?Protein: 150g ?Fat: 55g ? ? ?  NUTRITION DIAGNOSIS  ?[place]-3.2 Unintentional weight loss As related to thyroid and insuffient calorie intake.  As evidenced by 20+ lbs weight loss and elevated TSH levels.. ? ? ?NUTRITION INTERVENTION  ?Nutrition education (E-1) on the following topics:  ?Lifestyle Medicine ?- Whole Food, Plant Predominant Nutrition is highly recommended: Eat Plenty of vegetables, Mushrooms, fruits, Legumes, Whole Grains, Nuts, seeds in lieu of processed meats, processed snacks/pastries red meat, poultry, eggs.  ?  ?-It is better to avoid simple carbohydrates including: Cakes, Sweet Desserts, Ice Cream, Soda (diet and regular), Sweet Tea, Candies, Chips, Cookies, Store Bought Juices, Alcohol in Excess of  1-2 drinks a day, Lemonade,  Artificial Sweeteners, Doughnuts, Coffee Creamers, "Sugar-free" Products, etc, etc.  This is not a complete list..... ? ?Exercise: If you are able: 30 -60 minutes a day ,4 days a week, or 150 minutes a week.  The longer the better.  Combine stretch, strength, and aerobic activities.  If you were told in  the past that you have high risk for cardiovascular diseases, you may seek evaluation by your heart doctor prior to initiating moderate to intense exercise programs. ?High Calorie High Protein Diet ? ? ?Handouts Provided Include  ?Lifestyle Nutrition ? ?Learning Style & Readiness for Change ?Teaching method utilized: Visual & Auditory  ?Demonstrated degree of understanding via: Teach Back  ?Barriers to learning/adherence to lifestyle change: Back issues ? ?Goals Established by Pt ?Goals ? ?Eat three meals per day at times discussed ?Don't skip meals ?Drink 5 bottles per week of water ?Increase snacks to 1 between each meal ?Gain 2-3 lbs per month ?Cut out sodas, juice and sugary foods and liquids and eat more foods from a garden. ? ? ?MONITORING & EVALUATION ?Dietary intake, weekly physical activity, and weight  in 1 month. ? ?Next Steps  ?Patient is to work on eating 3 meals per day and healthy snacks between meals. ? ?

## 2021-07-12 ENCOUNTER — Ambulatory Visit (HOSPITAL_COMMUNITY)
Admission: RE | Admit: 2021-07-12 | Discharge: 2021-07-12 | Disposition: A | Discharge status: Home / Self Care | Payer: Medicare HMO | Source: Ambulatory Visit | Attending: Vascular Surgery | Admitting: Vascular Surgery

## 2021-07-12 DIAGNOSIS — I723 Aneurysm of iliac artery: Secondary | ICD-10-CM | POA: Diagnosis not present

## 2021-07-12 DIAGNOSIS — N281 Cyst of kidney, acquired: Secondary | ICD-10-CM | POA: Diagnosis not present

## 2021-07-12 LAB — POCT I-STAT CREATININE: Creatinine, Ser: 1 mg/dL (ref 0.61–1.24)

## 2021-07-12 MED ORDER — IOHEXOL 350 MG/ML SOLN
75.0000 mL | Freq: Once | INTRAVENOUS | Status: AC | PRN
  Administered 2021-07-12: 75 mL via INTRAVENOUS

## 2021-07-18 ENCOUNTER — Ambulatory Visit (INDEPENDENT_AMBULATORY_CARE_PROVIDER_SITE_OTHER): Payer: Medicare HMO | Admitting: Vascular Surgery

## 2021-07-18 ENCOUNTER — Encounter: Payer: Self-pay | Admitting: Vascular Surgery

## 2021-07-18 DIAGNOSIS — I723 Aneurysm of iliac artery: Secondary | ICD-10-CM

## 2021-07-18 NOTE — Progress Notes (Signed)
Reach Sean Leonard by telephone to discuss CT scan results.  I last saw him in the office several weeks before.  He underwent CT of his abdomen pelvis on 07/12/2021.  This was compared to his CT scan from 09/20/2019.  There was minimal change.  His abdominal aorta is 3.0 cm his right common iliac artery is 2.5 cm his left common iliac artery is 1.9 cm.  His internal iliac artery on the right is 2.9 and on the left is 3.0.  He understands symptoms of leaking aneurysm and report immediately to the emergency room should this occur.  Otherwise we will see him in 2 years with CT of his abdomen and pelvis ?

## 2021-07-19 ENCOUNTER — Other Ambulatory Visit: Payer: Self-pay | Admitting: Vascular Surgery

## 2021-07-19 ENCOUNTER — Other Ambulatory Visit (HOSPITAL_COMMUNITY): Payer: Self-pay | Admitting: Vascular Surgery

## 2021-07-19 NOTE — Telephone Encounter (Signed)
Vm for no show ?

## 2021-07-23 ENCOUNTER — Encounter: Payer: Self-pay | Admitting: Nutrition

## 2021-08-06 DIAGNOSIS — H01004 Unspecified blepharitis left upper eyelid: Secondary | ICD-10-CM | POA: Diagnosis not present

## 2021-08-06 DIAGNOSIS — H01002 Unspecified blepharitis right lower eyelid: Secondary | ICD-10-CM | POA: Diagnosis not present

## 2021-08-06 DIAGNOSIS — H01001 Unspecified blepharitis right upper eyelid: Secondary | ICD-10-CM | POA: Diagnosis not present

## 2021-08-06 DIAGNOSIS — H25813 Combined forms of age-related cataract, bilateral: Secondary | ICD-10-CM | POA: Diagnosis not present

## 2021-08-15 ENCOUNTER — Ambulatory Visit: Payer: Medicare HMO | Admitting: Nutrition

## 2021-08-31 ENCOUNTER — Other Ambulatory Visit: Payer: Self-pay | Admitting: Otolaryngology

## 2021-08-31 ENCOUNTER — Other Ambulatory Visit (HOSPITAL_COMMUNITY): Payer: Self-pay | Admitting: Otolaryngology

## 2021-08-31 DIAGNOSIS — R9389 Abnormal findings on diagnostic imaging of other specified body structures: Secondary | ICD-10-CM

## 2021-08-31 DIAGNOSIS — E890 Postprocedural hypothyroidism: Secondary | ICD-10-CM | POA: Diagnosis not present

## 2021-09-05 ENCOUNTER — Encounter (HOSPITAL_COMMUNITY): Payer: Self-pay

## 2021-09-05 ENCOUNTER — Ambulatory Visit (HOSPITAL_COMMUNITY)
Admission: RE | Admit: 2021-09-05 | Discharge: 2021-09-05 | Disposition: A | Discharge status: Home / Self Care | Payer: Medicare HMO | Source: Ambulatory Visit | Attending: Otolaryngology | Admitting: Otolaryngology

## 2021-09-05 DIAGNOSIS — R9389 Abnormal findings on diagnostic imaging of other specified body structures: Secondary | ICD-10-CM | POA: Diagnosis not present

## 2021-09-05 DIAGNOSIS — E0789 Other specified disorders of thyroid: Secondary | ICD-10-CM | POA: Diagnosis not present

## 2021-09-05 DIAGNOSIS — E890 Postprocedural hypothyroidism: Secondary | ICD-10-CM | POA: Insufficient documentation

## 2021-09-05 MED ORDER — LIDOCAINE HCL (PF) 2 % IJ SOLN
10.0000 mL | Freq: Once | INTRAMUSCULAR | Status: AC
  Administered 2021-09-05: 10 mL

## 2021-09-05 NOTE — Progress Notes (Signed)
PT tolerated thyroid biopsy procedure well today. Labs and afirma obtained and sent for pathology. PT ambulatory at discharge with no acute distress noted and verbalized understanding of discharge instructions. 

## 2021-09-06 LAB — CYTOLOGY - NON PAP

## 2021-09-07 ENCOUNTER — Ambulatory Visit (HOSPITAL_COMMUNITY): Admission: RE | Admit: 2021-09-07 | Payer: Medicare HMO | Source: Ambulatory Visit

## 2021-09-11 ENCOUNTER — Other Ambulatory Visit: Payer: Self-pay | Admitting: Otolaryngology

## 2021-09-11 DIAGNOSIS — R9389 Abnormal findings on diagnostic imaging of other specified body structures: Secondary | ICD-10-CM

## 2021-10-02 DIAGNOSIS — K219 Gastro-esophageal reflux disease without esophagitis: Secondary | ICD-10-CM | POA: Diagnosis not present

## 2021-10-02 DIAGNOSIS — I10 Essential (primary) hypertension: Secondary | ICD-10-CM | POA: Diagnosis not present

## 2021-10-02 DIAGNOSIS — D649 Anemia, unspecified: Secondary | ICD-10-CM | POA: Diagnosis not present

## 2021-10-02 DIAGNOSIS — E039 Hypothyroidism, unspecified: Secondary | ICD-10-CM | POA: Diagnosis not present

## 2021-11-01 DIAGNOSIS — H25811 Combined forms of age-related cataract, right eye: Secondary | ICD-10-CM | POA: Diagnosis not present

## 2021-11-05 ENCOUNTER — Encounter (HOSPITAL_COMMUNITY)
Admission: RE | Admit: 2021-11-05 | Discharge: 2021-11-05 | Disposition: A | Discharge status: Home / Self Care | Payer: Medicare HMO | Source: Ambulatory Visit | Attending: Ophthalmology | Admitting: Ophthalmology

## 2021-11-07 NOTE — H&P (Signed)
Surgical History & Physical  Patient Name: Sean Leonard DOB: [birth year]  Surgery: Cataract extraction with intraocular lens implant phacoemulsification; Right Eye  Surgeon: Baruch Goldmann MD Surgery Date:  11-09-21 Pre-Op Date:  11-01-21  HPI: A 6 Yr. old male patient 1. 1. The patient is here for a cataract evaluation of both eyes, referred from Dr. Wynetta Emery. Since the last visit, the affected area is worsening. The patient's vision is blurry. The complaint is associated with difficulty reading small print on medicine bottles/labels, driving at night due to glare, and filling out forms. Patient is not taking medications. This is negatively affecting the patient's quality of life and the patient is unable to function adequately in life with the current level of vision. HPI was performed by Baruch Goldmann .  Medical History: Cataracts no known family history of ARMD/Glaucoma LDL Thyroid Problems   Review of Systems Negative Allergic/Immunologic Negative Cardiovascular Negative Constitutional Negative Ear, Nose, Mouth & Throat Negative Endocrine Negative Eyes Negative Gastrointestinal Negative Genitourinary Negative Hemotologic/Lymphatic Negative Integumentary Negative Musculoskeletal Negative Neurological Negative Psychiatry Negative Respiratory  Social   Former smoker   Medication Levothyroxine, Omeprazole,   Sx/Procedures  None  Drug Allergies   NKDA  History & Physical: Heent:  Cataract, right eye NECK: supple without bruits LUNGS: lungs clear to auscultation CV: regular rate and rhythm Abdomen: soft and non-tender Impression & Plan: Assessment: 1.  COMBINED FORMS AGE RELATED CATARACT; Both Eyes (H25.813) 2.  BLEPHARITIS; Right Upper Lid, Right Lower Lid, Left Upper Lid, Left Lower Lid (H01.001, H01.002,H01.004,H01.005) 3.  Pinguecula; Both Eyes (H11.153)  Plan: 1.  Cataract accounts for the patient's decreased vision. This visual impairment is not  correctable with a tolerable change in glasses or contact lenses. Cataract surgery with an implantation of a new lens should significantly improve the visual and functional status of the patient. Discussed all risks, benefits, alternatives, and potential complications. Discussed the procedures and recovery. Patient desires to have surgery. A-scan ordered and performed today for intra-ocular lens calculations. The surgery will be performed in order to improve vision for driving, reading, and for eye examinations. Recommend phacoemulsification with intra-ocular lens. Recommend Dextenza for post-operative pain and inflammation. Right Eye worse - first. Dilates poorly - shugarcaine by protocol. Malyugin Ring. Omidira. Declines multifocal.  2.  Recommend regular lid cleaning.  3.  Observe; Artificial tears as needed for irritation.

## 2021-11-09 ENCOUNTER — Ambulatory Visit (HOSPITAL_BASED_OUTPATIENT_CLINIC_OR_DEPARTMENT_OTHER): Payer: Medicare HMO | Admitting: Anesthesiology

## 2021-11-09 ENCOUNTER — Encounter (HOSPITAL_COMMUNITY)
Admission: RE | Disposition: A | Discharge status: Home / Self Care | Payer: Self-pay | Source: Home / Self Care | Attending: Ophthalmology

## 2021-11-09 ENCOUNTER — Ambulatory Visit (HOSPITAL_COMMUNITY)
Admission: RE | Admit: 2021-11-09 | Discharge: 2021-11-09 | Disposition: A | Discharge status: Home / Self Care | Payer: Medicare HMO | Attending: Ophthalmology | Admitting: Ophthalmology

## 2021-11-09 ENCOUNTER — Ambulatory Visit (HOSPITAL_COMMUNITY): Payer: Medicare HMO | Admitting: Anesthesiology

## 2021-11-09 DIAGNOSIS — D638 Anemia in other chronic diseases classified elsewhere: Secondary | ICD-10-CM | POA: Diagnosis not present

## 2021-11-09 DIAGNOSIS — I739 Peripheral vascular disease, unspecified: Secondary | ICD-10-CM | POA: Diagnosis not present

## 2021-11-09 DIAGNOSIS — Z87891 Personal history of nicotine dependence: Secondary | ICD-10-CM

## 2021-11-09 DIAGNOSIS — G709 Myoneural disorder, unspecified: Secondary | ICD-10-CM | POA: Diagnosis not present

## 2021-11-09 DIAGNOSIS — K219 Gastro-esophageal reflux disease without esophagitis: Secondary | ICD-10-CM | POA: Insufficient documentation

## 2021-11-09 DIAGNOSIS — M1990 Unspecified osteoarthritis, unspecified site: Secondary | ICD-10-CM | POA: Insufficient documentation

## 2021-11-09 DIAGNOSIS — H25811 Combined forms of age-related cataract, right eye: Secondary | ICD-10-CM | POA: Insufficient documentation

## 2021-11-09 DIAGNOSIS — E039 Hypothyroidism, unspecified: Secondary | ICD-10-CM | POA: Diagnosis not present

## 2021-11-09 DIAGNOSIS — H269 Unspecified cataract: Secondary | ICD-10-CM

## 2021-11-09 HISTORY — PX: CATARACT EXTRACTION W/PHACO: SHX586

## 2021-11-09 SURGERY — PHACOEMULSIFICATION, CATARACT, WITH IOL INSERTION
Anesthesia: Monitor Anesthesia Care | Site: Eye | Laterality: Right

## 2021-11-09 MED ORDER — MIDAZOLAM HCL 2 MG/2ML IJ SOLN
INTRAMUSCULAR | Status: AC
  Filled 2021-11-09: qty 2

## 2021-11-09 MED ORDER — EPINEPHRINE PF 1 MG/ML IJ SOLN
INTRAMUSCULAR | Status: AC
  Filled 2021-11-09: qty 2

## 2021-11-09 MED ORDER — PHENYLEPHRINE-KETOROLAC 1-0.3 % IO SOLN
INTRAOCULAR | Status: DC | PRN
  Administered 2021-11-09: 500 mL via OPHTHALMIC

## 2021-11-09 MED ORDER — MIDAZOLAM HCL 2 MG/2ML IJ SOLN
INTRAMUSCULAR | Status: DC | PRN
  Administered 2021-11-09: 1 mg via INTRAVENOUS

## 2021-11-09 MED ORDER — BSS IO SOLN
INTRAOCULAR | Status: DC | PRN
  Administered 2021-11-09: 15 mL via INTRAOCULAR

## 2021-11-09 MED ORDER — LIDOCAINE HCL (PF) 1 % IJ SOLN
INTRAOCULAR | Status: DC | PRN
  Administered 2021-11-09: 1 mL via OPHTHALMIC

## 2021-11-09 MED ORDER — PHENYLEPHRINE HCL 2.5 % OP SOLN
1.0000 [drp] | OPHTHALMIC | Status: AC | PRN
  Administered 2021-11-09 (×3): 1 [drp] via OPHTHALMIC

## 2021-11-09 MED ORDER — TETRACAINE HCL 0.5 % OP SOLN
1.0000 [drp] | OPHTHALMIC | Status: AC | PRN
  Administered 2021-11-09 (×3): 1 [drp] via OPHTHALMIC

## 2021-11-09 MED ORDER — TROPICAMIDE 1 % OP SOLN
1.0000 [drp] | OPHTHALMIC | Status: AC | PRN
  Administered 2021-11-09 (×3): 1 [drp] via OPHTHALMIC

## 2021-11-09 MED ORDER — PHENYLEPHRINE-KETOROLAC 1-0.3 % IO SOLN
INTRAOCULAR | Status: AC
  Filled 2021-11-09: qty 4

## 2021-11-09 MED ORDER — STERILE WATER FOR IRRIGATION IR SOLN
Status: DC | PRN
  Administered 2021-11-09: 250 mL

## 2021-11-09 MED ORDER — SODIUM CHLORIDE 0.9% FLUSH
INTRAVENOUS | Status: DC | PRN
  Administered 2021-11-09: 3 mL via INTRAVENOUS

## 2021-11-09 MED ORDER — SODIUM HYALURONATE 10 MG/ML IO SOLUTION
PREFILLED_SYRINGE | INTRAOCULAR | Status: DC | PRN
  Administered 2021-11-09: 0.85 mL via INTRAOCULAR

## 2021-11-09 MED ORDER — LIDOCAINE HCL 3.5 % OP GEL
1.0000 | Freq: Once | OPHTHALMIC | Status: AC
  Administered 2021-11-09: 1 via OPHTHALMIC

## 2021-11-09 MED ORDER — POVIDONE-IODINE 5 % OP SOLN
OPHTHALMIC | Status: DC | PRN
  Administered 2021-11-09: 1 via OPHTHALMIC

## 2021-11-09 MED ORDER — SODIUM HYALURONATE 23MG/ML IO SOSY
PREFILLED_SYRINGE | INTRAOCULAR | Status: DC | PRN
  Administered 2021-11-09: 0.6 mL via INTRAOCULAR

## 2021-11-09 MED ORDER — NEOMYCIN-POLYMYXIN-DEXAMETH 3.5-10000-0.1 OP SUSP
OPHTHALMIC | Status: DC | PRN
  Administered 2021-11-09: 2 [drp]

## 2021-11-09 SURGICAL SUPPLY — 14 items
CATARACT SUITE SIGHTPATH (MISCELLANEOUS) ×1 IMPLANT
CLOTH BEACON ORANGE TIMEOUT ST (SAFETY) ×1 IMPLANT
EYE SHIELD UNIVERSAL CLEAR (GAUZE/BANDAGES/DRESSINGS) IMPLANT
FEE CATARACT SUITE SIGHTPATH (MISCELLANEOUS) ×1 IMPLANT
GLOVE BIOGEL PI IND STRL 7.0 (GLOVE) ×2 IMPLANT
GLOVE BIOGEL PI INDICATOR 7.0 (GLOVE) ×1
GLOVE SURG SS PI 7.0 STRL IVOR (GLOVE) IMPLANT
LENS IOL RAYNER 19.5 (Intraocular Lens) ×1 IMPLANT
LENS IOL RAYONE EMV 19.5 (Intraocular Lens) IMPLANT
PAD ARMBOARD 7.5X6 YLW CONV (MISCELLANEOUS) ×1 IMPLANT
SYR TB 1ML LL NO SAFETY (SYRINGE) ×1 IMPLANT
TAPE SURG TRANSPORE 1 IN (GAUZE/BANDAGES/DRESSINGS) IMPLANT
TAPE SURGICAL TRANSPORE 1 IN (GAUZE/BANDAGES/DRESSINGS) ×1
WATER STERILE IRR 250ML POUR (IV SOLUTION) ×1 IMPLANT

## 2021-11-09 NOTE — Anesthesia Postprocedure Evaluation (Signed)
Anesthesia Post Note  Patient: Sean Leonard  Procedure(s) Performed: CATARACT EXTRACTION PHACO AND INTRAOCULAR LENS PLACEMENT (IOC) (Right: Eye)  Patient location during evaluation: Phase II Anesthesia Type: MAC Level of consciousness: awake and alert and oriented Pain management: pain level controlled Vital Signs Assessment: post-procedure vital signs reviewed and stable Respiratory status: spontaneous breathing, nonlabored ventilation and respiratory function stable Cardiovascular status: blood pressure returned to baseline and stable Postop Assessment: no apparent nausea or vomiting Anesthetic complications: no   No notable events documented.   Last Vitals:  Vitals:   11/09/21 0846 11/09/21 0847  BP:  120/80  Pulse:    Resp:  17  Temp: 36.9 C   SpO2: 98%     Last Pain:  Vitals:   11/09/21 0846  TempSrc: Oral  PainSc:                  Daianna Vasques C Alvena Kiernan

## 2021-11-09 NOTE — Anesthesia Preprocedure Evaluation (Signed)
Anesthesia Evaluation  Patient identified by MRN, date of birth, ID band Patient awake    Reviewed: Allergy & Precautions, NPO status , Patient's Chart, lab work & pertinent test results  Airway Mallampati: II  TM Distance: >3 FB Neck ROM: Full    Dental  (+) Upper Dentures, Lower Dentures   Pulmonary neg pulmonary ROS, Patient abstained from smoking., former smoker,    Pulmonary exam normal breath sounds clear to auscultation       Cardiovascular Exercise Tolerance: Good + Peripheral Vascular Disease  Normal cardiovascular exam Rhythm:Regular Rate:Normal     Neuro/Psych  Neuromuscular disease negative psych ROS   GI/Hepatic PUD, GERD  Medicated,(+)     substance abuse  marijuana use,   Endo/Other  Hypothyroidism   Renal/GU negative Renal ROS  negative genitourinary   Musculoskeletal  (+) Arthritis , Osteoarthritis,    Abdominal   Peds negative pediatric ROS (+)  Hematology  (+) Blood dyscrasia, anemia ,   Anesthesia Other Findings Iliac artery aneurysm, bilateral (HCC  Reproductive/Obstetrics negative OB ROS                             Anesthesia Physical Anesthesia Plan  ASA: 3  Anesthesia Plan: MAC   Post-op Pain Management: Minimal or no pain anticipated   Induction: Intravenous  PONV Risk Score and Plan:   Airway Management Planned: Nasal Cannula and Natural Airway  Additional Equipment:   Intra-op Plan:   Post-operative Plan:   Informed Consent: I have reviewed the patients History and Physical, chart, labs and discussed the procedure including the risks, benefits and alternatives for the proposed anesthesia with the patient or authorized representative who has indicated his/her understanding and acceptance.       Plan Discussed with: CRNA and Surgeon  Anesthesia Plan Comments:         Anesthesia Quick Evaluation

## 2021-11-09 NOTE — Interval H&P Note (Signed)
History and Physical Interval Note:  11/09/2021 8:19 AM  Sean Leonard  has presented today for surgery, with the diagnosis of combined forms age related cataract; right.  The various methods of treatment have been discussed with the patient and family. After consideration of risks, benefits and other options for treatment, the patient has consented to  Procedure(s) with comments: CATARACT EXTRACTION PHACO AND INTRAOCULAR LENS PLACEMENT (IOC) (Right) - CDE:  as a surgical intervention.  The patient's history has been reviewed, patient examined, no change in status, stable for surgery.  I have reviewed the patient's chart and labs.  Questions were answered to the patient's satisfaction.     Baruch Goldmann

## 2021-11-09 NOTE — Discharge Instructions (Addendum)
Please discharge patient when stable, will follow up today with Dr. Marisa Hua at the [facility] office at 10:10AM following discharge.  Leave shield in place until visit.  All paperwork with discharge instructions will be given at the office.  [facility] Address:  [address]  [place], [place] [address]

## 2021-11-09 NOTE — Anesthesia Procedure Notes (Signed)
Date/Time: 11/09/2021 8:25 AM  Performed by: Vista Deck, CRNAPre-anesthesia Checklist: Patient identified, Emergency Drugs available, Suction available, Timeout performed and Patient being monitored Patient Re-evaluated:Patient Re-evaluated prior to induction Oxygen Delivery Method: Nasal Cannula

## 2021-11-09 NOTE — Op Note (Signed)
Date of procedure: 11/09/21  Pre-operative diagnosis:  Visually significant combined form age-related cataract, Right Eye (H25.811)  Post-operative diagnosis:  Visually significant combined form age-related cataract, Right Eye (H25.811)  Procedure: Removal of cataract via phacoemulsification and insertion of intra-ocular lens Rayner [id number] +19.5D into the capsular bag of the Right Eye  Attending surgeon: Gerda Diss. Kamille Toomey, MD, MA  Anesthesia: MAC, Topical Akten  Complications: None  Estimated Blood Loss: <74m (minimal)  Specimens: None  Implants: As above  Indications:  Visually significant age-related cataract, Right Eye  Procedure:  The patient was seen and identified in the pre-operative area. The operative eye was identified and dilated.  The operative eye was marked.  Topical anesthesia was administered to the operative eye.     The patient was then to the operative suite and placed in the supine position.  A timeout was performed confirming the patient, procedure to be performed, and all other relevant information.   The patient's face was prepped and draped in the usual fashion for intra-ocular surgery.  A lid speculum was placed into the operative eye and the surgical microscope moved into place and focused.  A superotemporal paracentesis was created using a 20 gauge paracentesis blade.  Shugarcaine was injected into the anterior chamber.  Viscoelastic was injected into the anterior chamber.  A temporal clear-corneal main wound incision was created using a 2.414mmicrokeratome.  A continuous curvilinear capsulorrhexis was initiated using an irrigating cystitome and completed using capsulorrhexis forceps.  Hydrodissection and hydrodeliniation were performed.  Viscoelastic was injected into the anterior chamber.  A phacoemulsification handpiece and a chopper as a second instrument were used to remove the nucleus and epinucleus. The irrigation/aspiration handpiece was used to remove any  remaining cortical material.   The capsular bag was reinflated with viscoelastic, checked, and found to be intact.  The intraocular lens was inserted into the capsular bag.  The irrigation/aspiration handpiece was used to remove any remaining viscoelastic.  The clear corneal wound and paracentesis wounds were then hydrated and checked with Weck-Cels to be watertight.  The lid-speculum was removed.  The drape was removed.  The patient's face was cleaned with a wet and dry 4x4.   Maxitrol was instilled in the eye. A clear shield was taped over the eye. The patient was taken to the post-operative care unit in good condition, having tolerated the procedure well.  Post-Op Instructions: [facility] and will receive all other orders and instructions.

## 2021-11-09 NOTE — Transfer of Care (Signed)
Immediate Anesthesia Transfer of Care Note  Patient: Sean Leonard  Procedure(s) Performed: CATARACT EXTRACTION PHACO AND INTRAOCULAR LENS PLACEMENT (IOC) (Right: Eye)  Patient Location: PACU  Anesthesia Type:MAC  Level of Consciousness: awake  Airway & Oxygen Therapy: Patient Spontanous Breathing  Post-op Assessment: Report given to RN and Post -op Vital signs reviewed and stable  Post vital signs: Reviewed and stable  Last Vitals:  Vitals Value Taken Time  BP 120/80 11/09/21 0847  Temp 36.9 C 11/09/21 0846  Pulse 76 0849  Resp 17 11/09/21 0847  SpO2 98 % 11/09/21 0846    Last Pain:  Vitals:   11/09/21 0846  TempSrc: Oral  PainSc:          Complications: No notable events documented.

## 2021-11-14 ENCOUNTER — Encounter (HOSPITAL_COMMUNITY): Payer: Self-pay | Admitting: Ophthalmology

## 2021-11-15 DIAGNOSIS — H25812 Combined forms of age-related cataract, left eye: Secondary | ICD-10-CM | POA: Diagnosis not present

## 2021-11-19 ENCOUNTER — Encounter (HOSPITAL_COMMUNITY)
Admission: RE | Admit: 2021-11-19 | Discharge: 2021-11-19 | Disposition: A | Discharge status: Home / Self Care | Payer: Medicare HMO | Source: Ambulatory Visit | Attending: Ophthalmology | Admitting: Ophthalmology

## 2021-11-19 ENCOUNTER — Encounter (HOSPITAL_COMMUNITY): Payer: Self-pay

## 2021-11-20 NOTE — H&P (Signed)
Surgical History & Physical  Patient Name: Sean Leonard DOB: 08-May-1945  Surgery: Cataract extraction with intraocular lens implant phacoemulsification; Left Eye  Surgeon: Baruch Goldmann MD Surgery Date:  11-23-21 Pre-Op Date:  11-15-21  HPI: A 52 Yr. old male patient present for 6 day post op OD, preop OS sx 11/23/2021. 1. The patient is returning after cataract surgery of the right eye. Since the last visit, the affected area is doing well. The patient's vision has improved. Using Combo drop TID OD. 2. 2. Patient is here because of blurry vision in the left eye. He is having significant glare at night and has trouble reading in low light. There is a significant imbalance between the two eyes. This is negatively affecting the patient's quality of life and the patient is unable to function adequately in life with the current level of vision. HPI Completed by Dr. Baruch Goldmann  Medical History: Cataracts no known family history of ARMD/Glaucoma LDL Thyroid Problems  Review of Systems Negative Allergic/Immunologic Negative Cardiovascular Negative Constitutional Negative Ear, Nose, Mouth & Throat Negative Endocrine Negative Eyes Negative Gastrointestinal Negative Genitourinary Negative Hemotologic/Lymphatic Negative Integumentary Negative Musculoskeletal Negative Neurological Negative Psychiatry Negative Respiratory  Social   Former smoker   Medication Prednisolone-Moxifloxacin-Bromfenac,  Levothyroxine, Omeprazole,   Sx/Procedures Phaco c IOL OD,    Drug Allergies   NKDA  History & Physical: Heent: Cataract, left eye NECK: supple without bruits LUNGS: lungs clear to auscultation CV: regular rate and rhythm Abdomen: soft and non-tender Impression & Plan: Assessment: 1.  CATARACT EXTRACTION STATUS; Right Eye (Z98.41) 2.  COMBINED FORMS AGE RELATED CATARACT; Left Eye (H25.812) 3.  Myopia ; Right Eye (H52.11) 4.  NUCLEAR SCLEROSIS AGE RELATED; Left Eye  (H25.12)  Plan: 1.  1 week after cataract surgery. Doing well with improved vision and normal eye pressure. Call with any problems or concerns. Continue Pred-Moxi-Brom 2x/day for 3 more weeks.  2.  Cataract accounts for the patient's decreased vision. This visual impairment is not correctable with a tolerable change in glasses or contact lenses. Cataract surgery with an implantation of a new lens should significantly improve the visual and functional status of the patient. Discussed all risks, benefits, alternatives, and potential complications. Discussed the procedures and recovery. Patient desires to have surgery. A-scan ordered and performed today for intra-ocular lens calculations. The surgery will be performed in order to improve vision for driving, reading, and for eye examinations. Recommend phacoemulsification with intra-ocular lens. Recommend Dextenza for post-operative pain and inflammation. Left Eye. Surgery required to correct imbalance of vision. Dilates poorly - shugarcaine by protocol. Malyugin Ring. Omidira.  3.   4.

## 2021-11-23 ENCOUNTER — Encounter (HOSPITAL_COMMUNITY)
Admission: RE | Disposition: A | Discharge status: Home / Self Care | Payer: Self-pay | Source: Home / Self Care | Attending: Ophthalmology

## 2021-11-23 ENCOUNTER — Encounter (HOSPITAL_COMMUNITY): Payer: Self-pay | Admitting: Ophthalmology

## 2021-11-23 ENCOUNTER — Other Ambulatory Visit: Payer: Self-pay

## 2021-11-23 ENCOUNTER — Ambulatory Visit (HOSPITAL_BASED_OUTPATIENT_CLINIC_OR_DEPARTMENT_OTHER): Payer: Medicare HMO | Admitting: Anesthesiology

## 2021-11-23 ENCOUNTER — Ambulatory Visit (HOSPITAL_COMMUNITY): Payer: Medicare HMO | Admitting: Anesthesiology

## 2021-11-23 ENCOUNTER — Ambulatory Visit (HOSPITAL_COMMUNITY)
Admission: RE | Admit: 2021-11-23 | Discharge: 2021-11-23 | Disposition: A | Discharge status: Home / Self Care | Payer: Medicare HMO | Attending: Ophthalmology | Admitting: Ophthalmology

## 2021-11-23 DIAGNOSIS — Z8711 Personal history of peptic ulcer disease: Secondary | ICD-10-CM | POA: Insufficient documentation

## 2021-11-23 DIAGNOSIS — H25812 Combined forms of age-related cataract, left eye: Secondary | ICD-10-CM | POA: Insufficient documentation

## 2021-11-23 DIAGNOSIS — Z87891 Personal history of nicotine dependence: Secondary | ICD-10-CM | POA: Diagnosis not present

## 2021-11-23 DIAGNOSIS — D638 Anemia in other chronic diseases classified elsewhere: Secondary | ICD-10-CM

## 2021-11-23 DIAGNOSIS — Z8669 Personal history of other diseases of the nervous system and sense organs: Secondary | ICD-10-CM | POA: Diagnosis not present

## 2021-11-23 DIAGNOSIS — K219 Gastro-esophageal reflux disease without esophagitis: Secondary | ICD-10-CM | POA: Insufficient documentation

## 2021-11-23 DIAGNOSIS — I739 Peripheral vascular disease, unspecified: Secondary | ICD-10-CM | POA: Diagnosis not present

## 2021-11-23 DIAGNOSIS — M1990 Unspecified osteoarthritis, unspecified site: Secondary | ICD-10-CM

## 2021-11-23 DIAGNOSIS — E039 Hypothyroidism, unspecified: Secondary | ICD-10-CM | POA: Insufficient documentation

## 2021-11-23 DIAGNOSIS — H5211 Myopia, right eye: Secondary | ICD-10-CM | POA: Insufficient documentation

## 2021-11-23 DIAGNOSIS — Z9841 Cataract extraction status, right eye: Secondary | ICD-10-CM | POA: Insufficient documentation

## 2021-11-23 DIAGNOSIS — E890 Postprocedural hypothyroidism: Secondary | ICD-10-CM | POA: Diagnosis not present

## 2021-11-23 HISTORY — PX: CATARACT EXTRACTION W/PHACO: SHX586

## 2021-11-23 SURGERY — PHACOEMULSIFICATION, CATARACT, WITH IOL INSERTION
Anesthesia: Monitor Anesthesia Care | Site: Eye | Laterality: Left

## 2021-11-23 MED ORDER — POVIDONE-IODINE 5 % OP SOLN
OPHTHALMIC | Status: DC | PRN
  Administered 2021-11-23: 1 via OPHTHALMIC

## 2021-11-23 MED ORDER — TROPICAMIDE 1 % OP SOLN
1.0000 [drp] | OPHTHALMIC | Status: AC | PRN
  Administered 2021-11-23 (×3): 1 [drp] via OPHTHALMIC

## 2021-11-23 MED ORDER — PHENYLEPHRINE-KETOROLAC 1-0.3 % IO SOLN
INTRAOCULAR | Status: AC
  Filled 2021-11-23: qty 4

## 2021-11-23 MED ORDER — SODIUM HYALURONATE 10 MG/ML IO SOLUTION
PREFILLED_SYRINGE | INTRAOCULAR | Status: DC | PRN
  Administered 2021-11-23: 0.85 mL via INTRAOCULAR

## 2021-11-23 MED ORDER — SODIUM HYALURONATE 23MG/ML IO SOSY
PREFILLED_SYRINGE | INTRAOCULAR | Status: DC | PRN
  Administered 2021-11-23: 0.6 mL via INTRAOCULAR

## 2021-11-23 MED ORDER — MIDAZOLAM HCL 2 MG/2ML IJ SOLN
INTRAMUSCULAR | Status: AC
  Filled 2021-11-23: qty 2

## 2021-11-23 MED ORDER — MOXIFLOXACIN HCL 0.5 % OP SOLN
OPHTHALMIC | Status: DC | PRN
  Administered 2021-11-23: 2 [drp] via OPHTHALMIC

## 2021-11-23 MED ORDER — LIDOCAINE HCL 3.5 % OP GEL: 1.0000 | Freq: Once | OPHTHALMIC | Status: DC

## 2021-11-23 MED ORDER — STERILE WATER FOR IRRIGATION IR SOLN
Status: DC | PRN
  Administered 2021-11-23: 500 mL

## 2021-11-23 MED ORDER — PHENYLEPHRINE-KETOROLAC 1-0.3 % IO SOLN
INTRAOCULAR | Status: DC | PRN
  Administered 2021-11-23: 500 mL via OPHTHALMIC

## 2021-11-23 MED ORDER — MIDAZOLAM HCL 2 MG/2ML IJ SOLN
INTRAMUSCULAR | Status: DC | PRN
  Administered 2021-11-23 (×2): 1 mg via INTRAVENOUS

## 2021-11-23 MED ORDER — PHENYLEPHRINE HCL 2.5 % OP SOLN
1.0000 [drp] | OPHTHALMIC | Status: AC | PRN
  Administered 2021-11-23 (×3): 1 [drp] via OPHTHALMIC

## 2021-11-23 MED ORDER — TETRACAINE HCL 0.5 % OP SOLN
1.0000 [drp] | OPHTHALMIC | Status: AC | PRN
  Administered 2021-11-23 (×3): 1 [drp] via OPHTHALMIC

## 2021-11-23 MED ORDER — BSS IO SOLN
INTRAOCULAR | Status: DC | PRN
  Administered 2021-11-23: 15 mL via INTRAOCULAR

## 2021-11-23 MED ORDER — LIDOCAINE HCL (PF) 1 % IJ SOLN
INTRAOCULAR | Status: DC | PRN
  Administered 2021-11-23: 1 mL via OPHTHALMIC

## 2021-11-23 SURGICAL SUPPLY — 16 items
CATARACT SUITE SIGHTPATH (MISCELLANEOUS) ×1 IMPLANT
CLOTH BEACON ORANGE TIMEOUT ST (SAFETY) ×1 IMPLANT
EYE SHIELD UNIVERSAL CLEAR (GAUZE/BANDAGES/DRESSINGS) IMPLANT
FEE CATARACT SUITE SIGHTPATH (MISCELLANEOUS) ×1 IMPLANT
GLOVE BIOGEL PI IND STRL 6.5 (GLOVE) IMPLANT
GLOVE BIOGEL PI IND STRL 7.0 (GLOVE) ×2 IMPLANT
GLOVE ECLIPSE 6.0 STRL STRAW (GLOVE) IMPLANT
LENS IOL RAYNER 19.5 (Intraocular Lens) ×1 IMPLANT
LENS IOL RAYONE EMV 19.5 (Intraocular Lens) IMPLANT
NDL HYPO 18GX1.5 BLUNT FILL (NEEDLE) ×1 IMPLANT
NEEDLE HYPO 18GX1.5 BLUNT FILL (NEEDLE) ×1 IMPLANT
PAD ARMBOARD 7.5X6 YLW CONV (MISCELLANEOUS) ×1 IMPLANT
SYR TB 1ML LL NO SAFETY (SYRINGE) ×1 IMPLANT
TAPE SURG TRANSPORE 1 IN (GAUZE/BANDAGES/DRESSINGS) IMPLANT
TAPE SURGICAL TRANSPORE 1 IN (GAUZE/BANDAGES/DRESSINGS) ×1
WATER STERILE IRR 500ML POUR (IV SOLUTION) IMPLANT

## 2021-11-23 NOTE — Discharge Instructions (Addendum)
Please discharge patient when stable, will follow up today with Dr. Wrzosek at the [facility] [place] office immediately following discharge.  Leave shield in place until visit.  All paperwork with discharge instructions will be given at the office.  [facility] [place] Address:  [address]  [place], [place] [address]  

## 2021-11-23 NOTE — Interval H&P Note (Signed)
History and Physical Interval Note:  11/23/2021 11:37 AM  Sean Leonard  has presented today for surgery, with the diagnosis of combined forms age related cataract; left.  The various methods of treatment have been discussed with the patient and family. After consideration of risks, benefits and other options for treatment, the patient has consented to  Procedure(s) with comments: CATARACT EXTRACTION PHACO AND INTRAOCULAR LENS PLACEMENT ([place]) (Left) - left as a surgical intervention.  The patient's history has been reviewed, patient examined, no change in status, stable for surgery.  I have reviewed the patient's chart and labs.  Questions were answered to the patient's satisfaction.     Baruch Goldmann

## 2021-11-23 NOTE — Transfer of Care (Signed)
Immediate Anesthesia Transfer of Care Note  Patient: Sean Leonard  Procedure(s) Performed: CATARACT EXTRACTION PHACO AND INTRAOCULAR LENS PLACEMENT ([facility]) (Left: Eye)  Patient Location: Short Stay  Anesthesia Type:MAC  Level of Consciousness: awake, alert , oriented and patient cooperative  Airway & Oxygen Therapy: Patient Spontanous Breathing  Post-op Assessment: Report given to RN, Post -op Vital signs reviewed and stable and Patient moving all extremities  Post vital signs: Reviewed and stable  Last Vitals:  Vitals Value Taken Time  BP    Temp    Pulse    Resp    SpO2      Last Pain:  Vitals:   11/23/21 1056  PainSc: 0-No pain         Complications: No notable events documented.

## 2021-11-23 NOTE — Anesthesia Preprocedure Evaluation (Signed)
Anesthesia Evaluation  Patient identified by MRN, date of birth, ID band Patient awake    Reviewed: Allergy & Precautions, NPO status , Patient's Chart, lab work & pertinent test results  Airway Mallampati: II  TM Distance: >3 FB Neck ROM: Full    Dental  (+) Upper Dentures, Lower Dentures   Pulmonary neg pulmonary ROS, Patient abstained from smoking., former smoker,    Pulmonary exam normal breath sounds clear to auscultation       Cardiovascular Exercise Tolerance: Good + Peripheral Vascular Disease  Normal cardiovascular exam Rhythm:Regular Rate:Normal     Neuro/Psych  Neuromuscular disease negative psych ROS   GI/Hepatic PUD, GERD  Medicated,(+)     substance abuse  marijuana use,   Endo/Other  Hypothyroidism   Renal/GU negative Renal ROS  negative genitourinary   Musculoskeletal  (+) Arthritis , Osteoarthritis,    Abdominal   Peds negative pediatric ROS (+)  Hematology  (+) Blood dyscrasia, anemia ,   Anesthesia Other Findings Iliac artery aneurysm, bilateral (HCC  Reproductive/Obstetrics negative OB ROS                             Anesthesia Physical Anesthesia Plan  ASA: 3  Anesthesia Plan: MAC   Post-op Pain Management: Minimal or no pain anticipated   Induction: Intravenous  PONV Risk Score and Plan:   Airway Management Planned: Nasal Cannula and Natural Airway  Additional Equipment:   Intra-op Plan:   Post-operative Plan:   Informed Consent: I have reviewed the patients History and Physical, chart, labs and discussed the procedure including the risks, benefits and alternatives for the proposed anesthesia with the patient or authorized representative who has indicated his/her understanding and acceptance.       Plan Discussed with: CRNA and Surgeon  Anesthesia Plan Comments:         Anesthesia Quick Evaluation  

## 2021-11-23 NOTE — Op Note (Signed)
Date of procedure: 11/23/21  Pre-operative diagnosis: Visually significant age-related combined cataract, Left Eye (H25.812)  Post-operative diagnosis: Visually significant age-related combined cataract, Left Eye (H25.812)  Procedure: Removal of cataract via phacoemulsification and insertion of intra-ocular lens Rayner [id number] +19.5D into the capsular bag of the Left Eye  Attending surgeon: Gerda Diss. Jeremi Losito, MD, MA  Anesthesia: MAC, Topical [place]  Complications: None  Estimated Blood Loss: <63m (minimal)  Specimens: None  Implants: As above  Indications:  Visually significant age-related cataract, Left Eye  Procedure:  The patient was seen and identified in the pre-operative area. The operative eye was identified and dilated.  The operative eye was marked.  Topical anesthesia was administered to the operative eye.     The patient was then to the operative suite and placed in the supine position.  A timeout was performed confirming the patient, procedure to be performed, and all other relevant information.   The patient's face was prepped and draped in the usual fashion for intra-ocular surgery.  A lid speculum was placed into the operative eye and the surgical microscope moved into place and focused.  An inferotemporal paracentesis was created using a 20 gauge paracentesis blade.  Shugarcaine was injected into the anterior chamber.  Viscoelastic was injected into the anterior chamber.  A temporal clear-corneal main wound incision was created using a 2.475mmicrokeratome.  A continuous curvilinear capsulorrhexis was initiated using an irrigating cystitome and completed using capsulorrhexis forceps.  Hydrodissection and hydrodeliniation were performed.  Viscoelastic was injected into the anterior chamber.  A phacoemulsification handpiece and a chopper as a second instrument were used to remove the nucleus and epinucleus. The irrigation/aspiration handpiece was used to remove any remaining  cortical material.   The capsular bag was reinflated with viscoelastic, checked, and found to be intact.  The intraocular lens was inserted into the capsular bag.  The irrigation/aspiration handpiece was used to remove any remaining viscoelastic.  The clear corneal wound and paracentesis wounds were then hydrated and checked with Weck-Cels to be watertight.  Moxifloxacin was instilled on the eye. The lid-speculum was removed.  The drape was removed.  The patient's face was cleaned with a wet and dry 4x4.    A clear shield was taped over the eye. The patient was taken to the post-operative care unit in good condition, having tolerated the procedure well.  Post-Op Instructions: [facility] and will receive all other orders and instructions.

## 2021-11-24 NOTE — Anesthesia Postprocedure Evaluation (Signed)
Anesthesia Post Note  Patient: Sean Leonard  Procedure(s) Performed: CATARACT EXTRACTION PHACO AND INTRAOCULAR LENS PLACEMENT ([facility]) (Left: Eye)  Patient location during evaluation: Phase II Anesthesia Type: MAC Level of consciousness: awake Pain management: pain level controlled Vital Signs Assessment: post-procedure vital signs reviewed and stable Respiratory status: spontaneous breathing and respiratory function stable Cardiovascular status: blood pressure returned to baseline and stable Postop Assessment: no headache and no apparent nausea or vomiting Anesthetic complications: no Comments: Late entry   No notable events documented.   Last Vitals:  Vitals:   11/23/21 1054 11/23/21 1207  BP: 136/78 118/75  Pulse: 75 67  Resp: 16 18  Temp: 36.5 C 37 C  SpO2: 100% 98%    Last Pain:  Vitals:   11/23/21 1207  TempSrc: Oral  PainSc: 0-No pain                 Louann Sjogren

## 2021-11-29 ENCOUNTER — Encounter (HOSPITAL_COMMUNITY): Payer: Self-pay | Admitting: Ophthalmology

## 2022-01-01 DIAGNOSIS — H524 Presbyopia: Secondary | ICD-10-CM | POA: Diagnosis not present

## 2022-01-01 DIAGNOSIS — Z961 Presence of intraocular lens: Secondary | ICD-10-CM | POA: Diagnosis not present

## 2022-01-01 DIAGNOSIS — H52223 Regular astigmatism, bilateral: Secondary | ICD-10-CM | POA: Diagnosis not present

## 2022-01-25 DIAGNOSIS — H524 Presbyopia: Secondary | ICD-10-CM | POA: Diagnosis not present

## 2022-01-25 DIAGNOSIS — H52209 Unspecified astigmatism, unspecified eye: Secondary | ICD-10-CM | POA: Diagnosis not present

## 2022-05-15 DIAGNOSIS — G9389 Other specified disorders of brain: Secondary | ICD-10-CM | POA: Diagnosis not present

## 2022-05-15 DIAGNOSIS — R634 Abnormal weight loss: Secondary | ICD-10-CM | POA: Diagnosis not present

## 2022-05-15 DIAGNOSIS — E039 Hypothyroidism, unspecified: Secondary | ICD-10-CM | POA: Diagnosis not present

## 2022-05-15 DIAGNOSIS — I6381 Other cerebral infarction due to occlusion or stenosis of small artery: Secondary | ICD-10-CM | POA: Diagnosis not present

## 2022-05-15 DIAGNOSIS — I10 Essential (primary) hypertension: Secondary | ICD-10-CM | POA: Diagnosis not present

## 2022-05-15 DIAGNOSIS — Q892 Congenital malformations of other endocrine glands: Secondary | ICD-10-CM | POA: Diagnosis not present

## 2022-05-15 DIAGNOSIS — R6881 Early satiety: Secondary | ICD-10-CM | POA: Diagnosis not present

## 2022-05-15 DIAGNOSIS — Z6820 Body mass index (BMI) 20.0-20.9, adult: Secondary | ICD-10-CM | POA: Diagnosis not present

## 2022-05-15 DIAGNOSIS — F1210 Cannabis abuse, uncomplicated: Secondary | ICD-10-CM | POA: Diagnosis not present

## 2022-05-15 DIAGNOSIS — Z1321 Encounter for screening for nutritional disorder: Secondary | ICD-10-CM | POA: Diagnosis not present

## 2022-05-28 DIAGNOSIS — I700 Atherosclerosis of aorta: Secondary | ICD-10-CM | POA: Diagnosis not present

## 2022-05-28 DIAGNOSIS — I7143 Infrarenal abdominal aortic aneurysm, without rupture: Secondary | ICD-10-CM | POA: Diagnosis not present

## 2022-05-28 DIAGNOSIS — G9389 Other specified disorders of brain: Secondary | ICD-10-CM | POA: Diagnosis not present

## 2022-05-28 DIAGNOSIS — N200 Calculus of kidney: Secondary | ICD-10-CM | POA: Diagnosis not present

## 2022-05-28 DIAGNOSIS — K5730 Diverticulosis of large intestine without perforation or abscess without bleeding: Secondary | ICD-10-CM | POA: Diagnosis not present

## 2022-05-28 DIAGNOSIS — R6881 Early satiety: Secondary | ICD-10-CM | POA: Diagnosis not present

## 2022-05-28 DIAGNOSIS — R109 Unspecified abdominal pain: Secondary | ICD-10-CM | POA: Diagnosis not present

## 2022-05-28 DIAGNOSIS — F1210 Cannabis abuse, uncomplicated: Secondary | ICD-10-CM | POA: Diagnosis not present

## 2022-06-25 ENCOUNTER — Ambulatory Visit (INDEPENDENT_AMBULATORY_CARE_PROVIDER_SITE_OTHER): Payer: No Typology Code available for payment source | Admitting: Internal Medicine

## 2022-06-25 ENCOUNTER — Encounter: Payer: Self-pay | Admitting: Internal Medicine

## 2022-06-25 ENCOUNTER — Other Ambulatory Visit: Payer: Self-pay | Admitting: *Deleted

## 2022-06-25 ENCOUNTER — Telehealth: Payer: Self-pay | Admitting: *Deleted

## 2022-06-25 VITALS — BP 124/79 | HR 81 | Temp 97.9°F | Ht 74.0 in | Wt 149.0 lb

## 2022-06-25 DIAGNOSIS — R634 Abnormal weight loss: Secondary | ICD-10-CM

## 2022-06-25 DIAGNOSIS — R6881 Early satiety: Secondary | ICD-10-CM | POA: Diagnosis not present

## 2022-06-25 NOTE — Telephone Encounter (Signed)
Pt informed that GES is scheduled for 07/02/22, arrive at 9 am to check, nothing to eat the morning of and no stomach medications. Expect to be there up to 4 hours. Verbalized understanding.

## 2022-06-25 NOTE — Patient Instructions (Signed)
It was good to see you again today!  You have had your symptoms for quite a while.  You have actually gained some weight by our scale since she was your last year.  It has been a while since we checked gastric emptying.  We will do another gastric emptying test now.  Also, we will check blood work called a TSH to make sure your thyroid is in line.  Further recommendations to follow.

## 2022-06-25 NOTE — Progress Notes (Unsigned)
Primary Care Physician:  Curlene Labrum, MD Primary Gastroenterologist:  Dr. Gala Romney  Pre-Procedure History & Physical: HPI:  Sean Leonard is a 77 y.o. male here for reassessment of chronic early satiety and weight loss.  He has had symptoms for approximately 4 years.  In 2020 he weighed in the 180s;  he dropped down to as low as about 143 by our scales when we saw him 2 years ago.  Extensive evaluation including GES, EGD,CTA abdomen,  colonoscopy failed to demonstrate any significant pathology which would explain his symptoms.  He is not diabetic.  He gets no abdominal pain whatsoever.  He moves his bowels regularly no melena or rectal bleeding.  Is aged out on colonoscopy.  He denies dysphagia and he does not throw up.  Patient tells me, for instance, last night his wife fixed a steak for him.  He was hungry; he could only eat about half of it and then he was full.  TSH previously depressed his dose of Synthroid was decreased.  Is retired from [employer].  He last weighed November 2022 143 in our office;  he weighs 149 here today.  It is notable the patient absolutely denies any abdominal pain with eating.  Past Medical History:  Diagnosis Date   DDD (degenerative disc disease), lumbar    Diverticula of colon    GERD (gastroesophageal reflux disease)    Hypothyroidism    Iliac artery aneurysm, bilateral    PAD (peripheral artery disease)    Peptic ulcer    Prostatitis     Past Surgical History:  Procedure Laterality Date   BIOPSY  01/02/2017   Procedure: BIOPSY;  Surgeon: Daneil Dolin, MD;  [facility];  Service: Endoscopy;;  GASTRIC AND ESOPHAGEAL BIOPSIES   BIOPSY  09/14/2019   Procedure: BIOPSY;  Surgeon: Daneil Dolin, MD;  [facility];  Service: Endoscopy;;   CATARACT EXTRACTION W/PHACO Right 11/09/2021   Procedure: CATARACT EXTRACTION PHACO AND INTRAOCULAR LENS PLACEMENT ([place]);  Surgeon: Baruch Goldmann, MD;  Location: [facility];   Service: Ophthalmology;  Laterality: Right;  CDE: 5.65   CATARACT EXTRACTION W/PHACO Left 11/23/2021   Procedure: CATARACT EXTRACTION PHACO AND INTRAOCULAR LENS PLACEMENT ([facility]);  Surgeon: Baruch Goldmann, MD;  Location: [facility];  Service: Ophthalmology;  Laterality: Left;  CDE 6.44   COLONOSCOPY  April 2005   Dr. Irving Shows. Small internal hemorrhoids. Multiple small scattered diverticula in the cecum and descending colon.   COLONOSCOPY N/A 01/18/2014   Procedure: COLONOSCOPY;  Surgeon: Daneil Dolin, MD;  [facility];  Service: Endoscopy;  Laterality: N/A;  1100   COLONOSCOPY N/A 06/23/2016   Procedure: COLONOSCOPY;  Surgeon: Rogene Houston, MD;  [facility];  Service: Endoscopy;  Laterality: N/A;   COLONOSCOPY WITH ESOPHAGOGASTRODUODENOSCOPY (EGD) N/A 12/16/2012   Dr. Gala Romney: rectal and colonic polyps with inadequate preparation, tubular adenoma. EGD with biopsy proven short segment Barrett's esophagus, hiatal hernia, mild chronic inactive gastritis    COLONOSCOPY WITH PROPOFOL N/A 06/24/2016   Procedure: COLONOSCOPY WITH PROPOFOL;  Surgeon: Danie Binder, MD;  [facility];  Service: Endoscopy;  Laterality: N/A;   COLONOSCOPY WITH PROPOFOL N/A 06/28/2016   Procedure: COLONOSCOPY WITH PROPOFOL;  Surgeon: Mauri Pole, MD;  Location: [facility];  Service: Endoscopy;  Laterality: N/A;   COLONOSCOPY WITH PROPOFOL N/A 11/22/2019   Procedure: COLONOSCOPY WITH PROPOFOL;  Surgeon: Daneil Dolin, Nonbleeding internal hemorrhoids, pancolonic diverticulosis.  No recommendations to repeat due to age.   ESOPHAGOGASTRODUODENOSCOPY  July 2005   Dr. Irving Shows grade 2 esophagitis at the GE junction. Acute gastritis. Multiple acute active, deep, irregular, friable ulcers in the antrum and body of the stomach. 2 active, deep, friable, irregular ulcers in the duodenum. Pathology not available.   ESOPHAGOGASTRODUODENOSCOPY N/A 01/18/2014   Procedure: ESOPHAGOGASTRODUODENOSCOPY  (EGD);  Surgeon: Daneil Dolin, MD;  [facility];  Service: Endoscopy;  Laterality: N/A;   ESOPHAGOGASTRODUODENOSCOPY (EGD) WITH PROPOFOL N/A 01/02/2017   Noralyn Karim: Mucosal changes in the esophagus, undilated Z-line.  Small hiatal hernia.  Some snake skinning or fish scale appearance to the gastric mucosa.  Gastric biopsy with slight chronic inflammation, no H. pylori.  Esophageal biopsy with mild inflammation consistent with reflux.   ESOPHAGOGASTRODUODENOSCOPY (EGD) WITH PROPOFOL N/A 09/14/2019   Procedure: ESOPHAGOGASTRODUODENOSCOPY (EGD) WITH PROPOFOL;  Surgeon: Daneil Dolin, MD;  Normal esophagus, no evidence of Barrett's epithelium, abnormal gastric mucosa of uncertain significance s/p biopsy, small hiatal hernia, normal examined duodenum.  Pathology revealed mild chronic gastritis, negative for H. Pylori.   EXCISION ORAL TUMOR Left 08/19/2018   Procedure: EXCISION LEFT BUCCAL MASS;  Surgeon: Leta Baptist, MD;  Location: [place];  Service: ENT;  Laterality: Left;   IR ANGIOGRAM VISCERAL SELECTIVE  06/26/2016   IR ANGIOGRAM VISCERAL SELECTIVE  06/26/2016   IR ANGIOGRAM VISCERAL SELECTIVE  06/26/2016   IR US GUIDE VASC ACCESS RIGHT  06/26/2016   NOSE SURGERY     POLYPECTOMY  06/24/2016   Procedure: POLYPECTOMY;  Surgeon: Danie Binder, MD;  [facility];  Service: Endoscopy;;  cecal   THYROIDECTOMY     TONSILLECTOMY      Prior to Admission medications   Medication Sig Start Date End Date Taking? Authorizing Provider  Cholecalciferol (VITAMIN D) 50 MCG (2000 UT) tablet Take 2,000 Units by mouth daily.   Yes [provider]  levothyroxine (SYNTHROID) 88 MCG tablet Take 88 mcg by mouth daily. 01/24/21  Yes [provider]  Multiple Vitamin (MULTIVITAMIN WITH MINERALS) TABS tablet Take 1 tablet by mouth daily.   Yes [provider]  Omega-3 Fatty Acids (FISH OIL PO) Take 2,400 mg by mouth daily.   Yes [provider]  omeprazole  (PRILOSEC) 20 MG capsule TAKE 1 CAPSULE EVERY DAY Patient taking differently: Take 20 mg by mouth daily before breakfast. 12/27/15  Yes Mahala Menghini, PA-C  pravastatin (PRAVACHOL) 20 MG tablet Take 20 mg by mouth daily.    Yes [provider]  sildenafil (VIAGRA) 100 MG tablet Take 100 mg by mouth daily as needed for erectile dysfunction.  07/27/19  Yes [provider]    Allergies as of 06/25/2022   (No Known Allergies)    Family History  Problem Relation Age of Onset   Hypertension Mother    Heart disease Mother        before age 29   Heart disease Father        before age 7   Hyperlipidemia Sister    Cancer Sister    Colon cancer Neg Hx    Liver disease Neg Hx     Social History   Socioeconomic History   Marital status: Single    Spouse name: Not on file   Number of children: Not on file   Years of education: Not on file   Highest education level: Not on file  Occupational History   Not on file  Tobacco Use   Smoking status: Former    Types: Cigars    Quit date: 11/27/2006    Years since quitting: 15.5   Smokeless tobacco: Never   Tobacco comments:    Quit in 2009  Vaping Use   Vaping Use: Never used  Substance and Sexual Activity   Alcohol use: Yes    Alcohol/week: 2.0 standard drinks of alcohol    Types: 2 Cans of beer per week    Comment: occ   Drug use: Yes    Frequency: 5.0 times per week    Types: Marijuana    Comment: occ   Sexual activity: Not Currently  Other Topics Concern   Not on file  Social History Narrative   1 deceased child. 1 living   Social Determinants of Health   Financial Resource Strain: Not on file  Food Insecurity: Not on file  Transportation Needs: Not on file  Physical Activity: Not on file  Stress: Not on file  Social Connections: Not on file  Intimate Partner Violence: Not on file    Review of Systems: See HPI, otherwise negative ROS  Physical Exam: BP 124/79 (BP Location: Right Arm, Patient  Position: Sitting, Cuff Size: Normal)   Pulse 81   Temp 97.9 F (36.6 C) (Oral)   Ht 6\' 2"  (1.88 m)   Wt 149 lb (67.6 kg)   SpO2 97%   BMI 19.13 kg/m  General:   Alert,  Well-developed, well-nourished, pleasant and cooperative in NAD Lungs:  Clear throughout to auscultation.   No wheezes, crackles, or rhonchi. No acute distress. Heart:  Regular rate and rhythm; no murmurs, clicks, rubs,  or gallops. Abdomen: Non-distended, normal bowel sounds.  Soft and nontender without appreciable mass or hepatosplenomegaly.   Impression: Very pleasant 76 year old gentleman retired from [employer] who presents for 3 to 4-year history of ongoing early satiety weight loss.  Symptoms have been intermittent but really not progressive since onset.  He has been extensively evaluated CTA abdomen, GES, EGD colonoscopy No medications to implicate.  Not mentioned above, he has not perceived an improvement with Remeron prescribed by his PCP. By our scales he is actually picked up 7 pounds since his last office visit.  It is unlikely we are dealing with a significant undiagnosed organic progressive process which remains undiagnosed.    Symptoms present for a few years now.  Some weight gain noted more recently Previously hyperthyroid on Synthroid.  Dose has been adjusted. Early satiety symptoms seem to bother him the most.  Recommendations: We will reassess gastric emptying with a GES. Also, we will check a TSH to make sure patient is euthyroid. No change in medical regimen for now.  Further recommendations to follow.     Notice: This dictation was prepared with Dragon dictation along with smaller phrase technology. Any transcriptional errors that result from this process are unintentional and may not be corrected upon review.

## 2022-06-27 DIAGNOSIS — R6881 Early satiety: Secondary | ICD-10-CM | POA: Diagnosis not present

## 2022-06-27 DIAGNOSIS — R634 Abnormal weight loss: Secondary | ICD-10-CM | POA: Diagnosis not present

## 2022-06-28 LAB — TSH: TSH: 0.75 mIU/L (ref 0.40–4.50)

## 2022-07-02 ENCOUNTER — Encounter (HOSPITAL_COMMUNITY)
Admission: RE | Admit: 2022-07-02 | Discharge: 2022-07-02 | Disposition: A | Discharge status: Home / Self Care | Payer: No Typology Code available for payment source | Source: Ambulatory Visit | Attending: Internal Medicine | Admitting: Internal Medicine

## 2022-07-02 ENCOUNTER — Other Ambulatory Visit (HOSPITAL_COMMUNITY): Payer: Self-pay | Admitting: Otolaryngology

## 2022-07-02 DIAGNOSIS — R634 Abnormal weight loss: Secondary | ICD-10-CM | POA: Insufficient documentation

## 2022-07-02 DIAGNOSIS — R6881 Early satiety: Secondary | ICD-10-CM | POA: Insufficient documentation

## 2022-07-02 DIAGNOSIS — R9389 Abnormal findings on diagnostic imaging of other specified body structures: Secondary | ICD-10-CM

## 2022-07-03 ENCOUNTER — Ambulatory Visit (HOSPITAL_COMMUNITY)
Admission: RE | Admit: 2022-07-03 | Discharge: 2022-07-03 | Disposition: A | Discharge status: Home / Self Care | Payer: No Typology Code available for payment source | Source: Ambulatory Visit | Attending: Internal Medicine | Admitting: Internal Medicine

## 2022-07-03 DIAGNOSIS — R6881 Early satiety: Secondary | ICD-10-CM | POA: Diagnosis not present

## 2022-07-03 DIAGNOSIS — R634 Abnormal weight loss: Secondary | ICD-10-CM | POA: Insufficient documentation

## 2022-07-03 MED ORDER — TECHNETIUM TC 99M SULFUR COLLOID
2.0000 | Freq: Once | INTRAVENOUS | Status: AC | PRN
  Administered 2022-07-03: 2.14 via ORAL

## 2022-07-08 ENCOUNTER — Ambulatory Visit (INDEPENDENT_AMBULATORY_CARE_PROVIDER_SITE_OTHER): Payer: No Typology Code available for payment source | Admitting: Gastroenterology

## 2022-07-08 ENCOUNTER — Encounter: Payer: Self-pay | Admitting: Gastroenterology

## 2022-07-08 VITALS — BP 116/73 | HR 94 | Temp 98.6°F | Ht 74.0 in | Wt 150.2 lb

## 2022-07-08 DIAGNOSIS — R634 Abnormal weight loss: Secondary | ICD-10-CM | POA: Diagnosis not present

## 2022-07-08 DIAGNOSIS — R6881 Early satiety: Secondary | ICD-10-CM

## 2022-07-08 NOTE — Patient Instructions (Addendum)
Continue omeprazole 20 mg once daily.  Follow with PCP for thyroid medication adjustments.  Continue boost/ensure 2/day Ensure you are getting plenty of protein daily.  Monitor your weight weekly at home  We will plan to follow-up in 4 to 6 months.  It was a pleasure to see you today. I want to create trusting relationships with patients. If you receive a survey regarding your visit,  I greatly appreciate you taking time to fill this out on paper or through your MyChart. I value your feedback.  Brooke Bonito, MSN, FNP-BC, AGACNP-BC [facility]

## 2022-07-08 NOTE — Progress Notes (Signed)
GI Office Note    Referring Provider: Juliette Alcide, MD Primary Care Physician:  Juliette Alcide, MD Primary Gastroenterologist: Gerrit Friends.Rourk, MD  Date:  07/08/2022  ID:  Sean Leonard, DOB 12/17/1945, MRN [id number]   Chief Complaint   Chief Complaint  Patient presents with   Follow-up    Follow up on endoscopy   History of Present Illness  Sean Leonard is a 77 y.o. male with a history of adenomatous colon polyps, Barrett's esophagus, IDA in the setting of GI bleed and 2018, diverticulosis, GERD, hypothyroidism, iliac artery aneurysm, PAD, prostatitis presenting today for follow-up of early satiety and weight loss.   CTA July 2020: No mesenteric ischemia.  CT A/P with contrast 09/20/2019: No acute findings to explain weight loss.  EGD 09/22/2019:  -Normal esophagus  -Abnormal gastric mucosa of uncertain significance s/p biopsy  -Small hiatal hernia -Normal examined duodenum -Pathology revealed mild chronic gastritis, negative for H. Pylori  Colonoscopy 11/22/2019:  -Nonbleeding internal hemorrhoids -Pancolonic diverticulosis -No repeat due to age  GES 11/18/19: Normal.   Last office visit 06/25/2022 with Dr. Jena Gauss.  Reported symptoms present for 4 years.  Last weight recorded was 143 pounds from 180 pounds.  Extensive evaluation in the past including GES, EGD, CTA abdomen, and colonoscopy all failed to demonstrate significant pathology to explain symptoms.  He declined any abdominal pain, constipation, melena, BRBPR.  Also denied any nausea, vomiting, dysphagia.  Weight in the office 149 pounds, up from 143 pounds 2 years prior.  Noted some previous hypothyroidism on Synthroid with recent dose adjustment.  Appears early satiety symptoms seem to bother him the most.  Advised to reassess gastric emptying with GES and recheck thyroid.  Labs 06/27/22: TSH 0.75 (advised to follow with PCP, possibly still with some mild hyperthyroidism on Synthroid)  GES 07/03/22: -79%  retention at 60 minutes, 98% retention at 120 minutes -Requested clarification on report given terminology used.   Today: On Remeron to help with appetite.  Drinking 2 boost or Ensure per day.  Does not like to eat breakfast now. Used to like to eat breakfast. Gets an appetite about 11am. Eats about anything, no specific preferences. No abdominal pain, N/V, dysphagia. No constipation or diarrhea. No melena or BRBPR.  No chest pain or shortness of breath.   GERD controlled on omeprazole.   States about a year ago he had his thyroid medication reduced to current dose. Has follow up 08/07/22 with PCP  Wt Readings from Last 3 Encounters:  07/08/22 150 lb 3.2 oz (68.1 kg)  06/25/22 149 lb (67.6 kg)  11/23/21 160 lb 15 oz (73 kg)    Current Outpatient Medications  Medication Sig Dispense Refill   Cholecalciferol (VITAMIN D) 50 MCG (2000 UT) tablet Take 2,000 Units by mouth daily.     levothyroxine (SYNTHROID) 88 MCG tablet Take 88 mcg by mouth daily.     Multiple Vitamin (MULTIVITAMIN WITH MINERALS) TABS tablet Take 1 tablet by mouth daily.     Omega-3 Fatty Acids (FISH OIL PO) Take 2,400 mg by mouth daily.     omeprazole (PRILOSEC) 20 MG capsule TAKE 1 CAPSULE EVERY DAY (Patient taking differently: Take 20 mg by mouth daily before breakfast.) 90 capsule 0   rosuvastatin (CRESTOR) 10 MG tablet Take 10 mg by mouth daily.     sildenafil (VIAGRA) 100 MG tablet Take 100 mg by mouth daily as needed for erectile dysfunction.      No current facility-administered medications for this  visit.    Past Medical History:  Diagnosis Date   DDD (degenerative disc disease), lumbar    Diverticula of colon    GERD (gastroesophageal reflux disease)    Hypothyroidism    Iliac artery aneurysm, bilateral    PAD (peripheral artery disease)    Peptic ulcer    Prostatitis     Past Surgical History:  Procedure Laterality Date   BIOPSY  01/02/2017   Procedure: BIOPSY;  Surgeon: Corbin Ade, MD;   Location: [facility];  Service: Endoscopy;;  GASTRIC AND ESOPHAGEAL BIOPSIES   BIOPSY  09/14/2019   Procedure: BIOPSY;  Surgeon: Corbin Ade, MD;  Location: [facility];  Service: Endoscopy;;   CATARACT EXTRACTION W/PHACO Right 11/09/2021   Procedure: CATARACT EXTRACTION PHACO AND INTRAOCULAR LENS PLACEMENT (IOC);  Surgeon: Fabio Pierce, MD;  Location: [facility];  Service: Ophthalmology;  Laterality: Right;  CDE: 5.65   CATARACT EXTRACTION W/PHACO Left 11/23/2021   Procedure: CATARACT EXTRACTION PHACO AND INTRAOCULAR LENS PLACEMENT (IOC);  Surgeon: Fabio Pierce, MD;  Location: [facility];  Service: Ophthalmology;  Laterality: Left;  CDE 6.44   COLONOSCOPY  April 2005   Dr. Elpidio Anis. Small internal hemorrhoids. Multiple small scattered diverticula in the cecum and descending colon.   COLONOSCOPY N/A 01/18/2014   Procedure: COLONOSCOPY;  Surgeon: Corbin Ade, MD;  Location: [facility];  Service: Endoscopy;  Laterality: N/A;  1100   COLONOSCOPY N/A 06/23/2016   Procedure: COLONOSCOPY;  Surgeon: Malissa Hippo, MD;  Location: [facility];  Service: Endoscopy;  Laterality: N/A;   COLONOSCOPY WITH ESOPHAGOGASTRODUODENOSCOPY (EGD) N/A 12/16/2012   Dr. Jena Gauss: rectal and colonic polyps with inadequate preparation, tubular adenoma. EGD with biopsy proven short segment Barrett's esophagus, hiatal hernia, mild chronic inactive gastritis    COLONOSCOPY WITH PROPOFOL N/A 06/24/2016   Procedure: COLONOSCOPY WITH PROPOFOL;  Surgeon: West Bali, MD;  Location: [facility];  Service: Endoscopy;  Laterality: N/A;   COLONOSCOPY WITH PROPOFOL N/A 06/28/2016   Procedure: COLONOSCOPY WITH PROPOFOL;  Surgeon: Napoleon Form, MD;  Location: [facility];  Service: Endoscopy;  Laterality: N/A;   COLONOSCOPY WITH PROPOFOL N/A 11/22/2019   Procedure: COLONOSCOPY WITH PROPOFOL;  Surgeon: Corbin Ade, Nonbleeding internal hemorrhoids, pancolonic diverticulosis.  No recommendations to repeat due to age.    ESOPHAGOGASTRODUODENOSCOPY  July 2005   Dr. Elpidio Anis grade 2 esophagitis at the GE junction. Acute gastritis. Multiple acute active, deep, irregular, friable ulcers in the antrum and body of the stomach. 2 active, deep, friable, irregular ulcers in the duodenum. Pathology not available.   ESOPHAGOGASTRODUODENOSCOPY N/A 01/18/2014   Procedure: ESOPHAGOGASTRODUODENOSCOPY (EGD);  Surgeon: Corbin Ade, MD;  Location: [facility];  Service: Endoscopy;  Laterality: N/A;   ESOPHAGOGASTRODUODENOSCOPY (EGD) WITH PROPOFOL N/A 01/02/2017   Rourk: Mucosal changes in the esophagus, undilated Z-line.  Small hiatal hernia.  Some snake skinning or fish scale appearance to the gastric mucosa.  Gastric biopsy with slight chronic inflammation, no H. pylori.  Esophageal biopsy with mild inflammation consistent with reflux.   ESOPHAGOGASTRODUODENOSCOPY (EGD) WITH PROPOFOL N/A 09/14/2019   Procedure: ESOPHAGOGASTRODUODENOSCOPY (EGD) WITH PROPOFOL;  Surgeon: Corbin Ade, MD;  Normal esophagus, no evidence of Barrett's epithelium, abnormal gastric mucosa of uncertain significance s/p biopsy, small hiatal hernia, normal examined duodenum.  Pathology revealed mild chronic gastritis, negative for H. Pylori.   EXCISION ORAL TUMOR Left 08/19/2018   Procedure: EXCISION LEFT BUCCAL MASS;  Surgeon: Newman Pies, MD;  Location: [facility];  Service: ENT;  Laterality: Left;   IR ANGIOGRAM VISCERAL SELECTIVE  06/26/2016   IR ANGIOGRAM VISCERAL SELECTIVE  06/26/2016   IR ANGIOGRAM VISCERAL SELECTIVE  06/26/2016   IR US GUIDE VASC ACCESS RIGHT  06/26/2016   NOSE SURGERY     POLYPECTOMY  06/24/2016   Procedure: POLYPECTOMY;  Surgeon: West Bali, MD;  Location: [facility];  Service: Endoscopy;;  cecal   THYROIDECTOMY     TONSILLECTOMY      Family History  Problem Relation Age of Onset   Hypertension Mother    Heart disease Mother        before age 62   Heart disease Father        before age 50    Hyperlipidemia Sister    Cancer Sister    Colon cancer Neg Hx    Liver disease Neg Hx     Allergies as of 07/08/2022   (No Known Allergies)    Social History   Socioeconomic History   Marital status: Married    Spouse name: Not on file   Number of children: Not on file   Years of education: Not on file   Highest education level: Not on file  Occupational History   Not on file  Tobacco Use   Smoking status: Former    Types: Cigars    Quit date: 11/27/2006    Years since quitting: 15.6   Smokeless tobacco: Never   Tobacco comments:    Quit in 2009  Vaping Use   Vaping Use: Never used  Substance and Sexual Activity   Alcohol use: Yes    Alcohol/week: 2.0 standard drinks of alcohol    Types: 2 Cans of beer per week    Comment: occ   Drug use: Yes    Frequency: 5.0 times per week    Types: Marijuana    Comment: occ   Sexual activity: Not Currently  Other Topics Concern   Not on file  Social History Narrative   1 deceased child. 1 living   Social Determinants of Health   Financial Resource Strain: Not on file  Food Insecurity: Not on file  Transportation Needs: Not on file  Physical Activity: Not on file  Stress: Not on file  Social Connections: Not on file     Review of Systems   Gen: + weight loss. Denies fever, chills, anorexia. Denies fatigue, weakness. CV: Denies chest pain, palpitations, syncope, peripheral edema, and claudication. Resp: Denies dyspnea at rest, cough, wheezing, coughing up blood, and pleurisy. GI: See HPI Derm: Denies rash, itching, dry skin Psych: Denies depression, anxiety, memory loss, confusion. No homicidal or suicidal ideation.  Heme: Denies bruising, bleeding, and enlarged lymph nodes.  Physical Exam   BP 116/73   Pulse 94   Temp 98.6 F (37 C)   Ht  (1.88 m)   Wt 150 lb 3.2 oz (68.1 kg)   BMI 19.28 kg/m   General:   Alert and oriented. No distress noted. Pleasant and cooperative. Thin appearing Head:   Normocephalic and atraumatic. Eyes:  Conjuctiva clear without scleral icterus. Mouth:  Oral mucosa pink and moist. Good dentition. No lesions. Lungs:  Clear to auscultation bilaterally. No wheezes, rales, or rhonchi. No distress.  Heart:  S1, S2 present without murmurs appreciated.  Abdomen:  +BS, soft, non-tender and non-distended. No rebound or guarding. No HSM or masses noted. Rectal: deferred Msk:  Symmetrical without gross deformities. Normal posture. Extremities:  Without edema.  Neurologic:  Alert and  oriented x4 Psych:  Alert and cooperative. Normal mood and affect.   Assessment  Sean Leonard is a 77 y.o. male with a history of adenomatous colon polyps, Barrett's esophagus, IDA in the setting of GI bleed and 2018, diverticulosis, GERD, hypothyroidism, iliac artery aneurysm, PAD, prostatitis presenting today for follow-up of early satiety and weight loss.   Early satiety, weight loss: Previously with significant weight loss of about 40 pounds dating back to 2022.  Since then he has gained 7-8 pounds.  Extensive negative workup with multiple CTA's of the abdomen and pelvis.  Workup was unremarkable EGD, colonoscopy, and GES. GES recently repeated with impression stating normal gastric emptying however worsening seems a little contradicting and needs clarification with radiology.  Also recent thyroid level low end of normal at 0.75.  He is currently on Synthroid 88 mcg daily and is due to see PCP next month.  Encourage patient to discuss his thyroid function with PCP to see if another decrease in medication is warranted.  Encouraged him to continue 2 boost or Ensure daily and monitor weight weekly.   PLAN   Follow up GES findings with radiology for clarification.  Continue omeprazole 20 mg once daily Follow with PCP for thyroid medication adjustments.  Continue boost/ensure 2/day.  Weekly weights Follow up in 4-6 months.    Brooke Bonito, MSN, FNP-BC, AGACNP-BC [facility]

## 2022-08-02 ENCOUNTER — Ambulatory Visit (HOSPITAL_COMMUNITY)
Admission: RE | Admit: 2022-08-02 | Discharge: 2022-08-02 | Disposition: A | Discharge status: Home / Self Care | Payer: No Typology Code available for payment source | Source: Ambulatory Visit | Attending: Otolaryngology | Admitting: Otolaryngology

## 2022-08-02 DIAGNOSIS — E890 Postprocedural hypothyroidism: Secondary | ICD-10-CM | POA: Diagnosis not present

## 2022-08-02 DIAGNOSIS — R9389 Abnormal findings on diagnostic imaging of other specified body structures: Secondary | ICD-10-CM

## 2022-08-13 ENCOUNTER — Other Ambulatory Visit: Payer: Self-pay | Admitting: Family Medicine

## 2022-08-13 DIAGNOSIS — E039 Hypothyroidism, unspecified: Secondary | ICD-10-CM | POA: Diagnosis not present

## 2022-08-13 DIAGNOSIS — Z6820 Body mass index (BMI) 20.0-20.9, adult: Secondary | ICD-10-CM | POA: Diagnosis not present

## 2022-08-13 DIAGNOSIS — I700 Atherosclerosis of aorta: Secondary | ICD-10-CM | POA: Diagnosis not present

## 2022-08-13 DIAGNOSIS — K5730 Diverticulosis of large intestine without perforation or abscess without bleeding: Secondary | ICD-10-CM | POA: Diagnosis not present

## 2022-08-13 DIAGNOSIS — Z0001 Encounter for general adult medical examination with abnormal findings: Secondary | ICD-10-CM | POA: Diagnosis not present

## 2022-08-13 DIAGNOSIS — I10 Essential (primary) hypertension: Secondary | ICD-10-CM | POA: Diagnosis not present

## 2022-08-13 DIAGNOSIS — R6881 Early satiety: Secondary | ICD-10-CM | POA: Diagnosis not present

## 2022-08-13 DIAGNOSIS — R634 Abnormal weight loss: Secondary | ICD-10-CM | POA: Diagnosis not present

## 2022-08-13 DIAGNOSIS — M5412 Radiculopathy, cervical region: Secondary | ICD-10-CM | POA: Diagnosis not present

## 2022-08-13 DIAGNOSIS — R350 Frequency of micturition: Secondary | ICD-10-CM | POA: Diagnosis not present

## 2022-08-13 DIAGNOSIS — F1210 Cannabis abuse, uncomplicated: Secondary | ICD-10-CM | POA: Diagnosis not present

## 2022-08-13 DIAGNOSIS — I714 Abdominal aortic aneurysm, without rupture, unspecified: Secondary | ICD-10-CM | POA: Diagnosis not present

## 2022-09-24 ENCOUNTER — Other Ambulatory Visit: Payer: No Typology Code available for payment source

## 2022-11-04 ENCOUNTER — Other Ambulatory Visit: Payer: Self-pay | Admitting: Family Medicine

## 2022-11-04 DIAGNOSIS — M5412 Radiculopathy, cervical region: Secondary | ICD-10-CM

## 2022-11-18 ENCOUNTER — Encounter: Payer: Self-pay | Admitting: Internal Medicine

## 2022-11-21 DIAGNOSIS — Z008 Encounter for other general examination: Secondary | ICD-10-CM | POA: Diagnosis not present

## 2022-11-21 DIAGNOSIS — F17290 Nicotine dependence, other tobacco product, uncomplicated: Secondary | ICD-10-CM | POA: Diagnosis not present

## 2022-11-21 DIAGNOSIS — I714 Abdominal aortic aneurysm, without rupture, unspecified: Secondary | ICD-10-CM | POA: Diagnosis not present

## 2022-11-21 DIAGNOSIS — Z6820 Body mass index (BMI) 20.0-20.9, adult: Secondary | ICD-10-CM | POA: Diagnosis not present

## 2022-12-01 ENCOUNTER — Other Ambulatory Visit: Payer: No Typology Code available for payment source

## 2022-12-10 DIAGNOSIS — M5412 Radiculopathy, cervical region: Secondary | ICD-10-CM | POA: Diagnosis not present

## 2022-12-10 DIAGNOSIS — I714 Abdominal aortic aneurysm, without rupture, unspecified: Secondary | ICD-10-CM | POA: Diagnosis not present

## 2022-12-10 DIAGNOSIS — I10 Essential (primary) hypertension: Secondary | ICD-10-CM | POA: Diagnosis not present

## 2022-12-10 DIAGNOSIS — I700 Atherosclerosis of aorta: Secondary | ICD-10-CM | POA: Diagnosis not present

## 2022-12-10 DIAGNOSIS — E039 Hypothyroidism, unspecified: Secondary | ICD-10-CM | POA: Diagnosis not present

## 2022-12-10 DIAGNOSIS — N401 Enlarged prostate with lower urinary tract symptoms: Secondary | ICD-10-CM | POA: Diagnosis not present

## 2022-12-10 DIAGNOSIS — Z6820 Body mass index (BMI) 20.0-20.9, adult: Secondary | ICD-10-CM | POA: Diagnosis not present

## 2022-12-10 DIAGNOSIS — N138 Other obstructive and reflux uropathy: Secondary | ICD-10-CM | POA: Diagnosis not present

## 2022-12-10 DIAGNOSIS — R6881 Early satiety: Secondary | ICD-10-CM | POA: Diagnosis not present

## 2022-12-10 DIAGNOSIS — Z23 Encounter for immunization: Secondary | ICD-10-CM | POA: Diagnosis not present

## 2022-12-10 DIAGNOSIS — F1210 Cannabis abuse, uncomplicated: Secondary | ICD-10-CM | POA: Diagnosis not present

## 2022-12-10 DIAGNOSIS — R634 Abnormal weight loss: Secondary | ICD-10-CM | POA: Diagnosis not present

## 2022-12-14 ENCOUNTER — Other Ambulatory Visit: Payer: No Typology Code available for payment source

## 2022-12-18 ENCOUNTER — Ambulatory Visit (INDEPENDENT_AMBULATORY_CARE_PROVIDER_SITE_OTHER): Payer: No Typology Code available for payment source | Admitting: Gastroenterology

## 2022-12-18 ENCOUNTER — Encounter: Payer: Self-pay | Admitting: Gastroenterology

## 2022-12-18 VITALS — BP 131/75 | HR 85 | Temp 97.9°F | Ht 74.0 in | Wt 154.2 lb

## 2022-12-18 DIAGNOSIS — R6881 Early satiety: Secondary | ICD-10-CM | POA: Diagnosis not present

## 2022-12-18 DIAGNOSIS — R634 Abnormal weight loss: Secondary | ICD-10-CM | POA: Diagnosis not present

## 2022-12-18 DIAGNOSIS — K219 Gastro-esophageal reflux disease without esophagitis: Secondary | ICD-10-CM | POA: Diagnosis not present

## 2022-12-18 NOTE — Progress Notes (Signed)
GI Office Note    Referring Provider: Juliette Alcide, MD Primary Care Physician:  Juliette Alcide, MD Primary Gastroenterologist: Gerrit Friends.Rourk, MD  Date:  12/18/2022  ID:  Sean Leonard, DOB [birth year], MRN [id number]   Chief Complaint   Chief Complaint  Patient presents with   Follow-up    Pt here for follow up on his weight   History of Present Illness  Sean Leonard is a 77 y.o. male with a history of Barrett's esophagus, IDA in the setting of GI bleed in 2018, adenomatous colon polyps, diverticulosis, GERD, hypothyroidism, iliac artery aneurysm, PAD, prostatitis presenting today for follow-up and weight loss and early satiety.  CTA July 2020: No mesenteric ischemia.   CT A/P with contrast 09/20/2019: No acute findings to explain weight loss.   EGD 09/22/2019:  -Normal esophagus  -Abnormal gastric mucosa of uncertain significance s/p biopsy  -Small hiatal hernia -Normal examined duodenum -Pathology revealed mild chronic gastritis, negative for H. Pylori   Colonoscopy 11/22/2019:  -Nonbleeding internal hemorrhoids -Pancolonic diverticulosis -No repeat due to age   GES 11/18/19: Normal.    Office visit 06/25/2022 with Dr. Jena Gauss.  Reported symptoms present for 4 years.  Last weight recorded was 143 pounds from 180 pounds.  Extensive evaluation in the past including GES, EGD, CTA abdomen, and colonoscopy all failed to demonstrate significant pathology to explain symptoms.  He declined any abdominal pain, constipation, melena, BRBPR.  Also denied any nausea, vomiting, dysphagia.  Weight in the office 149 pounds, up from 143 pounds 2 years prior.  Noted some previous hypothyroidism on Synthroid with recent dose adjustment.  Appears early satiety symptoms seem to bother him the most.  Advised to reassess gastric emptying with GES and recheck thyroid.   GES 07/03/22: -79% retention at 60 minutes, 98% retention at 120 minutes -Requested clarification on report given  terminology used.   Last office visit 07/08/22. On Remeron to help with appetite.  Drinking 2 boost or Ensure per day.  Recently does not like to eat breakfast and his appetite starts to improve around 11 AM.  Denied any abdominal pain, nausea, vomiting, dysphagia, constipation, diarrhea, melena, or BRBPR.  Reflux well-controlled with omeprazole.  Due to follow-up with PCP regarding thyroid medication in 1 month.  Weight this office visit was 150lbs.   TSH 06/27/22: 0.75  Thyroid ultrasound 08/05/2022: -No thyroid tissue identified on ultrasound exam (if no thyroidectomy then nonvisualized thyroid gland may be due to assumed shrunken tissue and heterogeneous nature given results of thyroiditis on previous biopsy)  Today: Weight loss/early satiety - Appetite is about the same. Still doing 2 boost/ensure per day. Eats around 10:30/11 and does a little snack about 2pm and then eats dinner around 6:30/7pm. Still getting full fairly quickly when he eats. Does not matter what he eats he gets full.  Ate half a steak the other night and then was not able to eat the rest. Had potatoes with it as well. Still taking Remeron.   No abdominal pain, n/v, dysphagia, melena or brbpr. No constipation or diarrhea. Energy level is fair. States he just recently had blood work with pcp and was not told anything was abnormal.   States he recently saw pcp regarding his medication and jsut recently had his dose increased back to 88 mcg.   Taking meloxicam for neck pain. Needs refill. Will address with PCP.   Wt Readings from Last 3 Encounters:  12/18/22 154 lb 3.2 oz (69.9 kg)  07/08/22 150  lb 3.2 oz (68.1 kg)  06/25/22 149 lb (67.6 kg)    Current Outpatient Medications  Medication Sig Dispense Refill   Cholecalciferol (VITAMIN D) 50 MCG (2000 UT) tablet Take 2,000 Units by mouth daily.     levothyroxine (SYNTHROID) 88 MCG tablet Take 88 mcg by mouth daily.     Multiple Vitamin (MULTIVITAMIN WITH MINERALS) TABS  tablet Take 1 tablet by mouth daily.     Omega-3 Fatty Acids (FISH OIL PO) Take 2,400 mg by mouth daily.     omeprazole (PRILOSEC) 20 MG capsule TAKE 1 CAPSULE EVERY DAY (Patient taking differently: Take 20 mg by mouth daily before breakfast.) 90 capsule 0   rosuvastatin (CRESTOR) 10 MG tablet Take 10 mg by mouth daily.     sildenafil (VIAGRA) 100 MG tablet Take 100 mg by mouth daily as needed for erectile dysfunction.      No current facility-administered medications for this visit.    Past Medical History:  Diagnosis Date   DDD (degenerative disc disease), lumbar    Diverticula of colon    GERD (gastroesophageal reflux disease)    Hypothyroidism    Iliac artery aneurysm, bilateral (HCC)    PAD (peripheral artery disease) (HCC)    Peptic ulcer    Prostatitis     Past Surgical History:  Procedure Laterality Date   BIOPSY  01/02/2017   Procedure: BIOPSY;  Surgeon: Corbin Ade, MD;  Location: [facility];  Service: Endoscopy;;  GASTRIC AND ESOPHAGEAL BIOPSIES   BIOPSY  09/14/2019   Procedure: BIOPSY;  Surgeon: Corbin Ade, MD;  Location: [facility];  Service: Endoscopy;;   CATARACT EXTRACTION W/PHACO Right 11/09/2021   Procedure: CATARACT EXTRACTION PHACO AND INTRAOCULAR LENS PLACEMENT (IOC);  Surgeon: Fabio Pierce, MD;  Location: [facility];  Service: Ophthalmology;  Laterality: Right;  CDE: 5.65   CATARACT EXTRACTION W/PHACO Left 11/23/2021   Procedure: CATARACT EXTRACTION PHACO AND INTRAOCULAR LENS PLACEMENT (IOC);  Surgeon: Fabio Pierce, MD;  Location: [facility];  Service: Ophthalmology;  Laterality: Left;  CDE 6.44   COLONOSCOPY  April 2005   Dr. Elpidio Anis. Small internal hemorrhoids. Multiple small scattered diverticula in the cecum and descending colon.   COLONOSCOPY N/A 01/18/2014   Procedure: COLONOSCOPY;  Surgeon: Corbin Ade, MD;  [facility]: [facility];  Service: Endoscopy;  Laterality: N/A;  1100   COLONOSCOPY N/A 06/23/2016   Procedure: COLONOSCOPY;  Surgeon:  Malissa Hippo, MD;  Location: [facility];  Service: Endoscopy;  Laterality: N/A;   COLONOSCOPY WITH ESOPHAGOGASTRODUODENOSCOPY (EGD) N/A 12/16/2012   Dr. Jena Gauss: rectal and colonic polyps with inadequate preparation, tubular adenoma. EGD with biopsy proven short segment Barrett's esophagus, hiatal hernia, mild chronic inactive gastritis    COLONOSCOPY WITH PROPOFOL N/A 06/24/2016   Procedure: COLONOSCOPY WITH PROPOFOL;  Surgeon: West Bali, MD;  [facility]: [facility];  Service: Endoscopy;  Laterality: N/A;   COLONOSCOPY WITH PROPOFOL N/A 06/28/2016   Procedure: COLONOSCOPY WITH PROPOFOL;  Surgeon: Napoleon Form, MD;  Location: [facility];  Service: Endoscopy;  Laterality: N/A;   COLONOSCOPY WITH PROPOFOL N/A 11/22/2019   Procedure: COLONOSCOPY WITH PROPOFOL;  Surgeon: Corbin Ade, Nonbleeding internal hemorrhoids, pancolonic diverticulosis.  No recommendations to repeat due to age.   ESOPHAGOGASTRODUODENOSCOPY  July 2005   Dr. Elpidio Anis grade 2 esophagitis at the GE junction. Acute gastritis. Multiple acute active, deep, irregular, friable ulcers in the antrum and body of the stomach. 2 active, deep, friable, irregular ulcers in the  duodenum. Pathology not available.   ESOPHAGOGASTRODUODENOSCOPY N/A 01/18/2014   Procedure: ESOPHAGOGASTRODUODENOSCOPY (EGD);  Surgeon: Corbin Ade, MD;  [facility]: [facility];  Service: Endoscopy;  Laterality: N/A;   ESOPHAGOGASTRODUODENOSCOPY (EGD) WITH PROPOFOL N/A 01/02/2017   Rourk: Mucosal changes in the esophagus, undilated Z-line.  Small hiatal hernia.  Some snake skinning or fish scale appearance to the gastric mucosa.  Gastric biopsy with slight chronic inflammation, no H. pylori.  Esophageal biopsy with mild inflammation consistent with reflux.   ESOPHAGOGASTRODUODENOSCOPY (EGD) WITH PROPOFOL N/A 09/14/2019   Procedure: ESOPHAGOGASTRODUODENOSCOPY (EGD) WITH PROPOFOL;  Surgeon: Corbin Ade, MD;  Normal esophagus, no evidence of Barrett's  epithelium, abnormal gastric mucosa of uncertain significance s/p biopsy, small hiatal hernia, normal examined duodenum.  Pathology revealed mild chronic gastritis, negative for H. Pylori.   EXCISION ORAL TUMOR Left 08/19/2018   Procedure: EXCISION LEFT BUCCAL MASS;  Surgeon: Newman Pies, MD;  Location: [facility];  Service: ENT;  Laterality: Left;   IR ANGIOGRAM VISCERAL SELECTIVE  06/26/2016   IR ANGIOGRAM VISCERAL SELECTIVE  06/26/2016   IR ANGIOGRAM VISCERAL SELECTIVE  06/26/2016   IR US GUIDE VASC ACCESS RIGHT  06/26/2016   NOSE SURGERY     POLYPECTOMY  06/24/2016   Procedure: POLYPECTOMY;  Surgeon: West Bali, MD;  Location: [facility];  Service: Endoscopy;;  cecal   THYROIDECTOMY     TONSILLECTOMY      Family History  Problem Relation Age of Onset   Hypertension Mother    Heart disease Mother        before age 39   Heart disease Father        before age 25   Hyperlipidemia Sister    Cancer Sister    Colon cancer Neg Hx    Liver disease Neg Hx     Allergies as of 12/18/2022   (No Known Allergies)    Social History   Socioeconomic History   Marital status: Married    Spouse name: Not on file   Number of children: Not on file   Years of education: Not on file   Highest education level: Not on file  Occupational History   Not on file  Tobacco Use   Smoking status: Former    Types: Cigars    Quit date: 11/27/2006    Years since quitting: 16.0   Smokeless tobacco: Never   Tobacco comments:    Quit in 2009  Vaping Use   Vaping status: Never Used  Substance and Sexual Activity   Alcohol use: Yes    Alcohol/week: 2.0 standard drinks of alcohol    Types: 2 Cans of beer per week    Comment: occ   Drug use: Yes    Frequency: 5.0 times per week    Types: Marijuana    Comment: occ   Sexual activity: Not Currently  Other Topics Concern   Not on file  Social History Narrative   1 deceased child. 1 living   Social Determinants of Health   Financial  Resource Strain: Not on file  Food Insecurity: Not on file  Transportation Needs: Not on file  Physical Activity: Not on file  Stress: Not on file  Social Connections: Not on file     Review of Systems   Gen: Denies fever, chills, anorexia. Denies fatigue, weakness, weight loss.  CV: Denies chest pain, palpitations, syncope, peripheral edema, and claudication. Resp: Denies dyspnea at rest, cough, wheezing, coughing up blood, and pleurisy. GI:  See HPI Derm: Denies rash, itching, dry skin Psych: Denies depression, anxiety, memory loss, confusion. No homicidal or suicidal ideation.  Heme: Denies bruising, bleeding, and enlarged lymph nodes.   Physical Exam   BP 131/75   Pulse 85   Temp 97.9 F (36.6 C)   Ht 6\' 2"  (1.88 m)   Wt 154 lb 3.2 oz (69.9 kg)   BMI 19.80 kg/m   General:   Alert and oriented. No distress noted. Pleasant and cooperative.  Head:  Normocephalic and atraumatic. Eyes:  Conjuctiva clear without scleral icterus. Lungs:  Clear to auscultation bilaterally. No wheezes, rales, or rhonchi. No distress.  Heart:  S1, S2 present without murmurs appreciated.  Abdomen:  +BS, soft, non-tender and non-distended. No rebound or guarding. No HSM or masses noted. Rectal: deferred Msk:  Symmetrical without gross deformities. Normal posture. Extremities:  Without edema. Neurologic:  Alert and  oriented x4 Psych:  Alert and cooperative. Normal mood and affect.  Assessment  Sean Leonard is a 77 y.o. male with a history of Barrett's esophagus, IDA in the setting of GI bleed in 2018, adenomatous colon polyps, diverticulosis, GERD, hypothyroidism, iliac artery aneurysm, PAD, prostatitis presenting today for follow-up and weight loss and early satiety.  Weight loss, early satiety: Patient had about a 40 pound weight loss in 2022.  Significant workup as outlined in HPI.  Since his last office visit he is gained about 4-5 pounds, prior to this he had gained about 7-8 pounds.   Continues to follow-up with his primary care regarding his hypothyroidism and continuing to have fluctuations of his Synthroid medication, has had ultrasound follow-up as well.  He continues to have some early satiety per patient although he is eating at least half-three quarters of his protein source along with at least 1 side/vegetable per meal.  He also continues to drink 2 boost or Ensure daily and has been doing well with this despite the cost of these, has been unable to get this covered by insurance.  No alarm symptoms present.  Based on calorie calculator performed today his baseline caloric intake should be around 1800 cal daily.  Encouraged for him to increase this to slowly gain some weight.  Currently BMI within normal limits.  GERD: Denies any nausea, vomiting, or dysphagia.  Reflux symptoms well-controlled with omeprazole 20 mg once daily.  PLAN   Continue omeprazole 20 mg once daily Follow a high-protein/high-calorie diet.  Encouraged 2000-2100 cal daily.  Provided separate written handout. Continue Remeron 30 mg nightly Continue 2 boost/Ensure daily Monitor weight at least weekly Follow-up in 6 months    Brooke Bonito, MSN, FNP-BC, AGACNP-BC [facility]

## 2022-12-18 NOTE — Patient Instructions (Addendum)
Continue omeprazole 20 mg once daily.  Continue boost/ensure 2/day Ensure you are getting plenty of protein daily. Goal calories for you are about 2000-2100 daily.  Monitor your weight weekly at home Continue Remeron 30 mg nightly   We will plan to follow-up in 6 months.  Please let me know if you begin to develop any nausea, vomiting, or any further significant drops in your weight.  It was a pleasure to see you today. I want to create trusting relationships with patients. If you receive a survey regarding your visit,  I greatly appreciate you taking time to fill this out on paper or through your MyChart. I value your feedback.  Brooke Bonito, MSN, FNP-BC, AGACNP-BC [facility]

## 2023-01-20 ENCOUNTER — Other Ambulatory Visit: Payer: No Typology Code available for payment source

## 2023-01-30 DIAGNOSIS — Z961 Presence of intraocular lens: Secondary | ICD-10-CM | POA: Diagnosis not present

## 2023-01-30 DIAGNOSIS — H5203 Hypermetropia, bilateral: Secondary | ICD-10-CM | POA: Diagnosis not present

## 2023-01-30 DIAGNOSIS — H52223 Regular astigmatism, bilateral: Secondary | ICD-10-CM | POA: Diagnosis not present

## 2023-01-30 DIAGNOSIS — H524 Presbyopia: Secondary | ICD-10-CM | POA: Diagnosis not present

## 2023-02-08 ENCOUNTER — Other Ambulatory Visit: Payer: No Typology Code available for payment source

## 2023-04-03 DIAGNOSIS — R634 Abnormal weight loss: Secondary | ICD-10-CM | POA: Diagnosis not present

## 2023-04-03 DIAGNOSIS — E039 Hypothyroidism, unspecified: Secondary | ICD-10-CM | POA: Diagnosis not present

## 2023-04-03 DIAGNOSIS — R6881 Early satiety: Secondary | ICD-10-CM | POA: Diagnosis not present

## 2023-04-03 DIAGNOSIS — Z6821 Body mass index (BMI) 21.0-21.9, adult: Secondary | ICD-10-CM | POA: Diagnosis not present

## 2023-04-03 DIAGNOSIS — M5412 Radiculopathy, cervical region: Secondary | ICD-10-CM | POA: Diagnosis not present

## 2023-04-03 DIAGNOSIS — R030 Elevated blood-pressure reading, without diagnosis of hypertension: Secondary | ICD-10-CM | POA: Diagnosis not present

## 2023-05-27 ENCOUNTER — Encounter: Payer: Self-pay | Admitting: Gastroenterology

## 2023-05-27 DIAGNOSIS — R634 Abnormal weight loss: Secondary | ICD-10-CM | POA: Diagnosis not present

## 2023-05-27 DIAGNOSIS — E039 Hypothyroidism, unspecified: Secondary | ICD-10-CM | POA: Diagnosis not present

## 2023-06-04 DIAGNOSIS — E785 Hyperlipidemia, unspecified: Secondary | ICD-10-CM | POA: Diagnosis not present

## 2023-06-04 DIAGNOSIS — Z008 Encounter for other general examination: Secondary | ICD-10-CM | POA: Diagnosis not present

## 2023-06-04 DIAGNOSIS — E039 Hypothyroidism, unspecified: Secondary | ICD-10-CM | POA: Diagnosis not present

## 2023-06-04 DIAGNOSIS — I10 Essential (primary) hypertension: Secondary | ICD-10-CM | POA: Diagnosis not present

## 2023-08-26 DIAGNOSIS — D529 Folate deficiency anemia, unspecified: Secondary | ICD-10-CM | POA: Diagnosis not present

## 2023-08-26 DIAGNOSIS — E039 Hypothyroidism, unspecified: Secondary | ICD-10-CM | POA: Diagnosis not present

## 2023-08-26 DIAGNOSIS — R5383 Other fatigue: Secondary | ICD-10-CM | POA: Diagnosis not present

## 2023-08-26 DIAGNOSIS — R634 Abnormal weight loss: Secondary | ICD-10-CM | POA: Diagnosis not present

## 2023-08-26 DIAGNOSIS — E7849 Other hyperlipidemia: Secondary | ICD-10-CM | POA: Diagnosis not present

## 2023-09-03 DIAGNOSIS — E039 Hypothyroidism, unspecified: Secondary | ICD-10-CM | POA: Diagnosis not present

## 2023-09-03 DIAGNOSIS — Z6820 Body mass index (BMI) 20.0-20.9, adult: Secondary | ICD-10-CM | POA: Diagnosis not present

## 2023-09-03 DIAGNOSIS — F1210 Cannabis abuse, uncomplicated: Secondary | ICD-10-CM | POA: Diagnosis not present

## 2023-09-03 DIAGNOSIS — E7849 Other hyperlipidemia: Secondary | ICD-10-CM | POA: Diagnosis not present

## 2023-09-03 DIAGNOSIS — R634 Abnormal weight loss: Secondary | ICD-10-CM | POA: Diagnosis not present

## 2023-09-03 DIAGNOSIS — Z1389 Encounter for screening for other disorder: Secondary | ICD-10-CM | POA: Diagnosis not present

## 2023-09-03 DIAGNOSIS — Z0000 Encounter for general adult medical examination without abnormal findings: Secondary | ICD-10-CM | POA: Diagnosis not present

## 2023-09-03 DIAGNOSIS — Z1331 Encounter for screening for depression: Secondary | ICD-10-CM | POA: Diagnosis not present

## 2023-09-03 DIAGNOSIS — Z0001 Encounter for general adult medical examination with abnormal findings: Secondary | ICD-10-CM | POA: Diagnosis not present

## 2023-09-24 ENCOUNTER — Ambulatory Visit: Admitting: Gastroenterology
